# Patient Record
Sex: Male | Born: 1940
Health system: Southern US, Community
[De-identification: ages and names within clinical notes are randomized; demographics above are authoritative.]

## PROBLEM LIST (undated history)

## (undated) DIAGNOSIS — J309 Allergic rhinitis, unspecified: Secondary | ICD-10-CM

## (undated) DIAGNOSIS — I499 Cardiac arrhythmia, unspecified: Secondary | ICD-10-CM

## (undated) DIAGNOSIS — Z9889 Other specified postprocedural states: Secondary | ICD-10-CM

## (undated) DIAGNOSIS — R112 Nausea with vomiting, unspecified: Secondary | ICD-10-CM

## (undated) DIAGNOSIS — K219 Gastro-esophageal reflux disease without esophagitis: Secondary | ICD-10-CM

## (undated) DIAGNOSIS — I493 Ventricular premature depolarization: Secondary | ICD-10-CM

## (undated) DIAGNOSIS — R197 Diarrhea, unspecified: Secondary | ICD-10-CM

## (undated) DIAGNOSIS — Z87442 Personal history of urinary calculi: Secondary | ICD-10-CM

## (undated) DIAGNOSIS — Z8719 Personal history of other diseases of the digestive system: Secondary | ICD-10-CM

## (undated) HISTORY — PX: TRANSURETHRAL RESECTION OF PROSTATE: SHX73

## (undated) HISTORY — DX: Gastro-esophageal reflux disease without esophagitis: K21.9

## (undated) HISTORY — PX: FLEXIBLE SIGMOIDOSCOPY: SHX1649

## (undated) HISTORY — DX: Personal history of other diseases of the digestive system: Z87.19

## (undated) HISTORY — PX: COLON SURGERY: SHX602

## (undated) HISTORY — DX: Allergic rhinitis, unspecified: J30.9

## (undated) HISTORY — PX: REPAIR IMPERFORATE ANUS / ANORECTOPLASTY: SUR1185

## (undated) HISTORY — PX: BASAL CELL CARCINOMA EXCISION: SHX1214

## (undated) HISTORY — DX: Personal history of urinary calculi: Z87.442

## (undated) HISTORY — DX: Ventricular premature depolarization: I49.3

## (undated) HISTORY — DX: Diarrhea, unspecified: R19.7

## (undated) HISTORY — PX: COLONOSCOPY: SHX174

## (undated) HISTORY — PX: EYE SURGERY: SHX253

---

## 2004-01-14 ENCOUNTER — Other Ambulatory Visit: Payer: Self-pay

## 2004-02-09 ENCOUNTER — Ambulatory Visit: Payer: Self-pay | Admitting: General Surgery

## 2004-02-19 ENCOUNTER — Ambulatory Visit: Payer: Self-pay | Admitting: General Surgery

## 2005-08-10 IMAGING — CT CT ABD-PELV W/ CM
1 of 2 series · 14 of 32 positions shown, 19 images · non-contrast
Comparison: none

REASON FOR EXAM: SP colon resection  eval abscess fever pain
COMMENTS:

[Series 2: abdomen · axial · 0.68mm/px · z∈[-476,-100]mm · 14 of 55 slices shown, 19 images]
[im 4/55  soft-tissue]
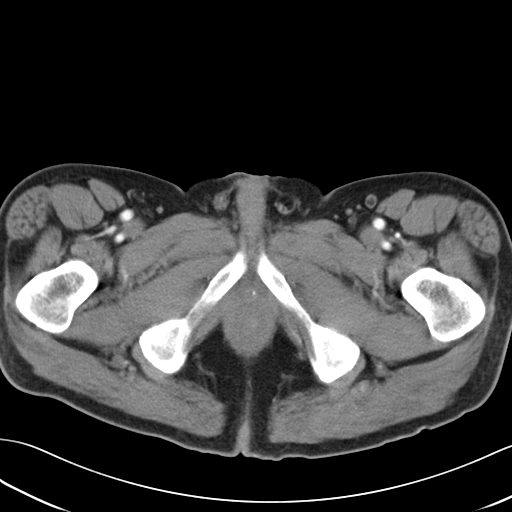
[im 4/55  bone]
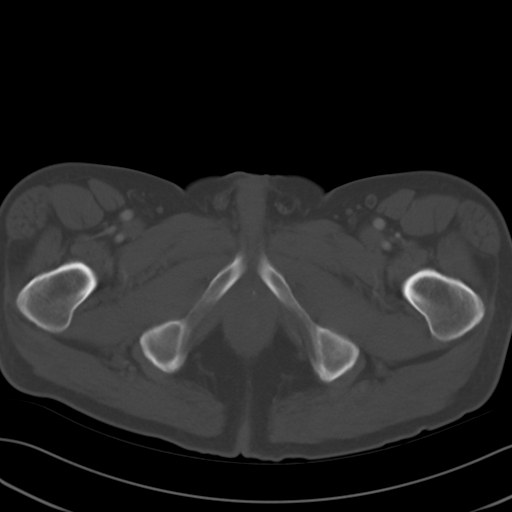
[im 7/55  soft-tissue]
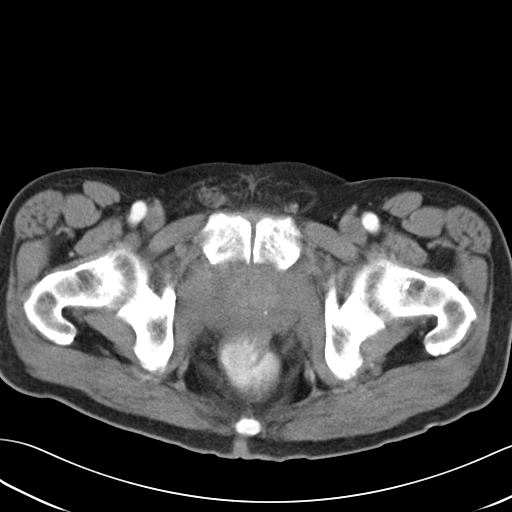
[im 11/55  soft-tissue]
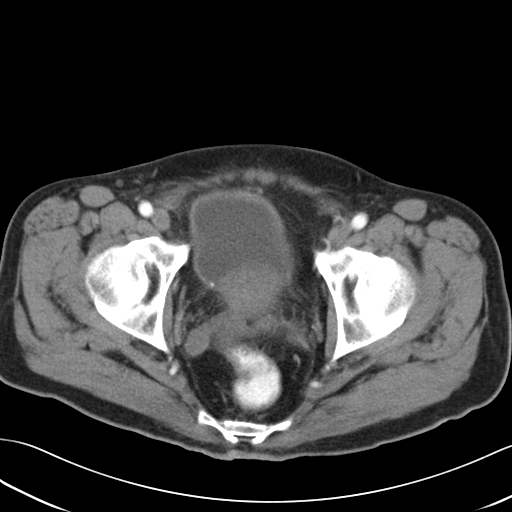
[im 17/55  soft-tissue]
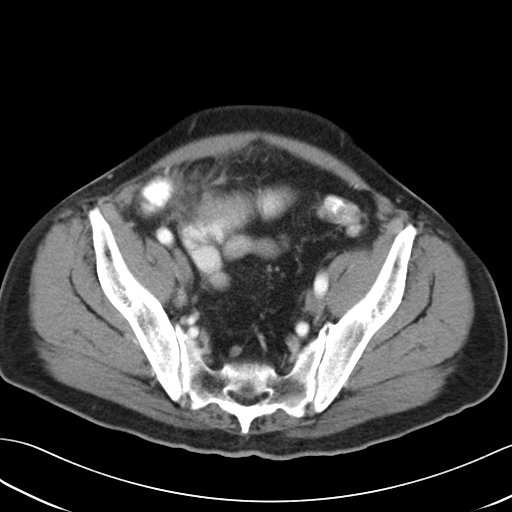
[im 21/55  soft-tissue]
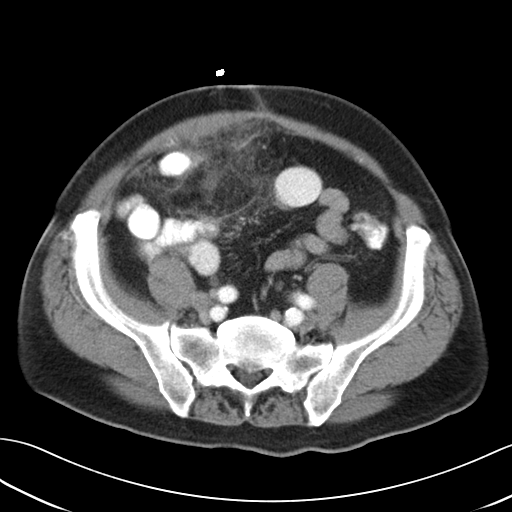
[im 24/55  soft-tissue]
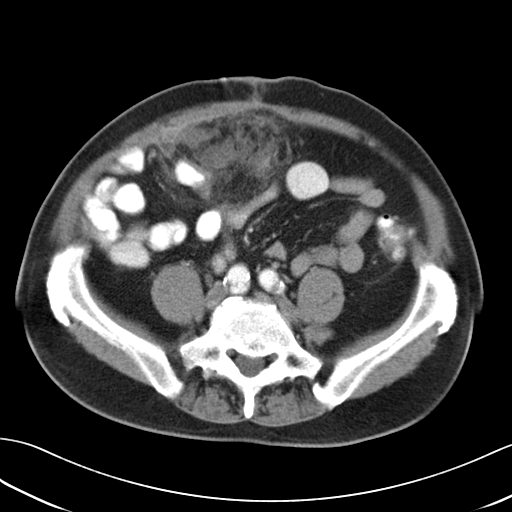
[im 28/55  soft-tissue]
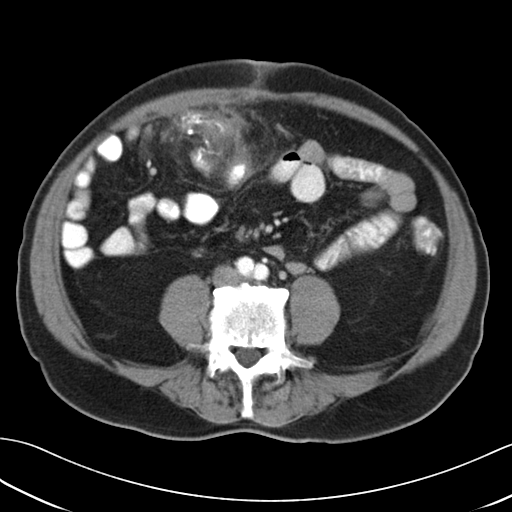
[im 31/55  soft-tissue]
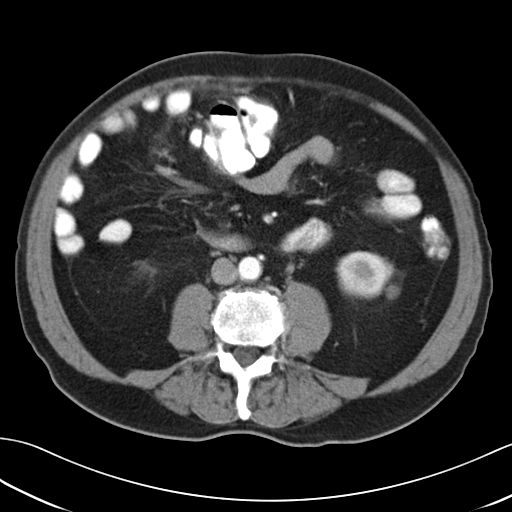
[im 34/55  soft-tissue]
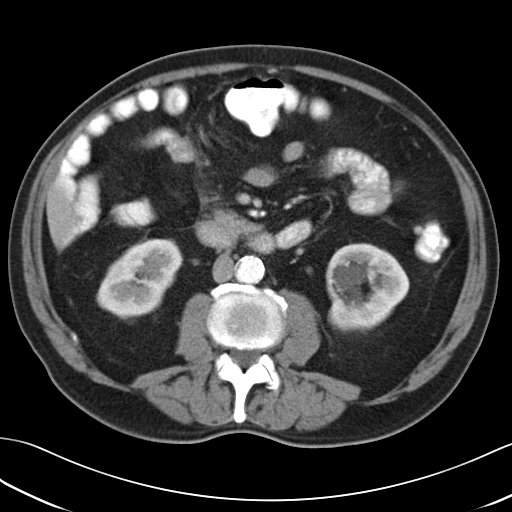
[im 34/55  bone]
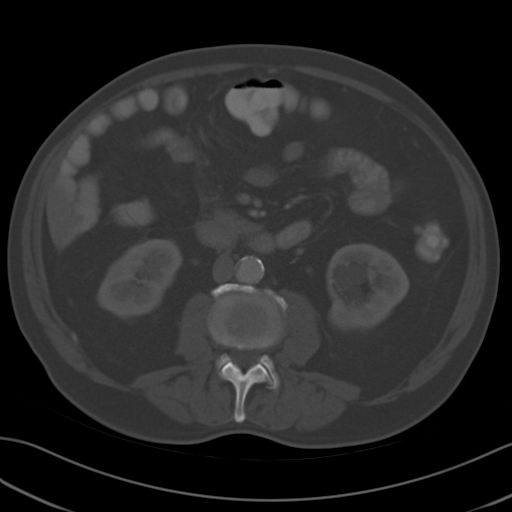
[im 38/55  soft-tissue]
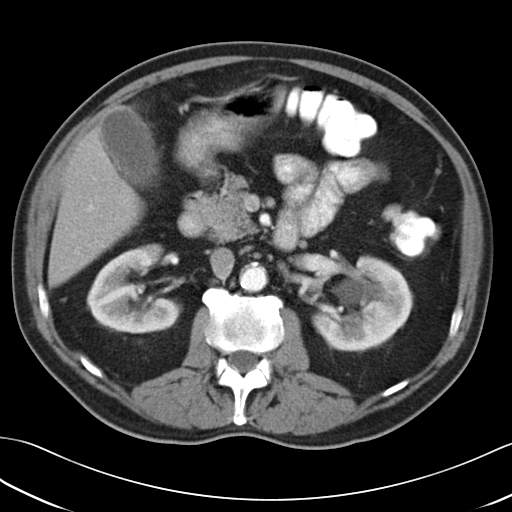
[im 41/55  lung]
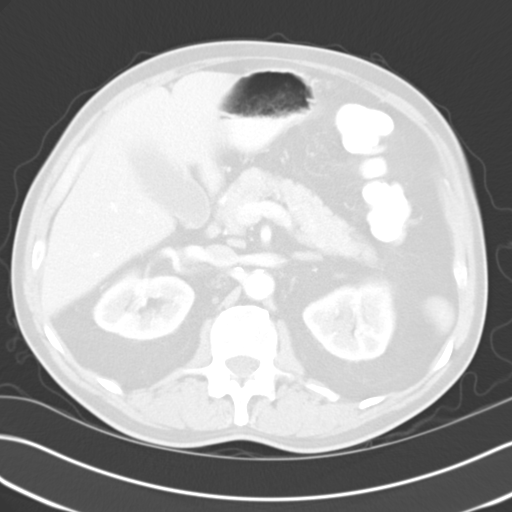
[im 44/55  soft-tissue]
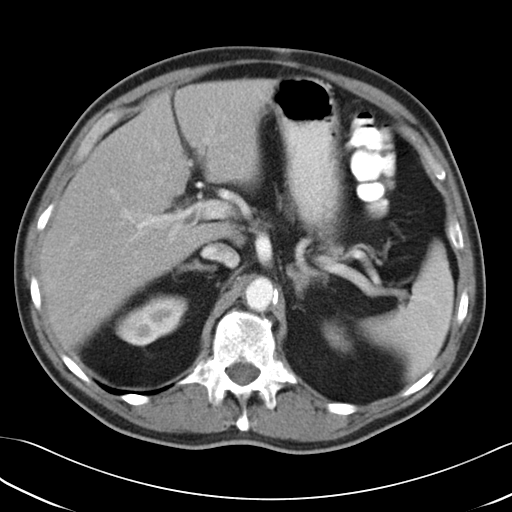
[im 44/55  lung]
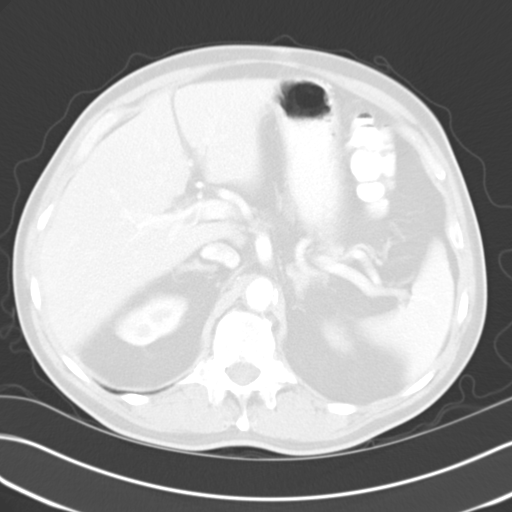
[im 48/55  soft-tissue]
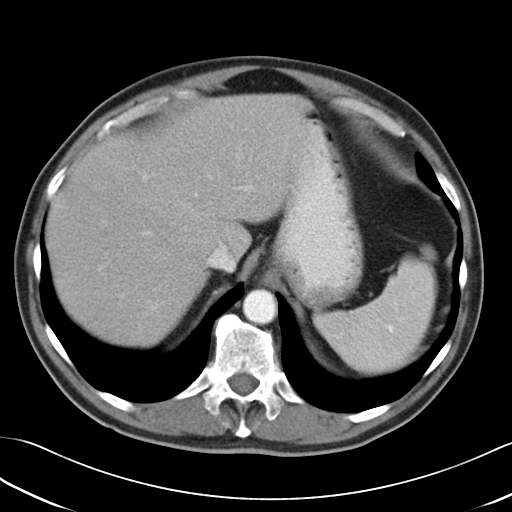
[im 48/55  lung]
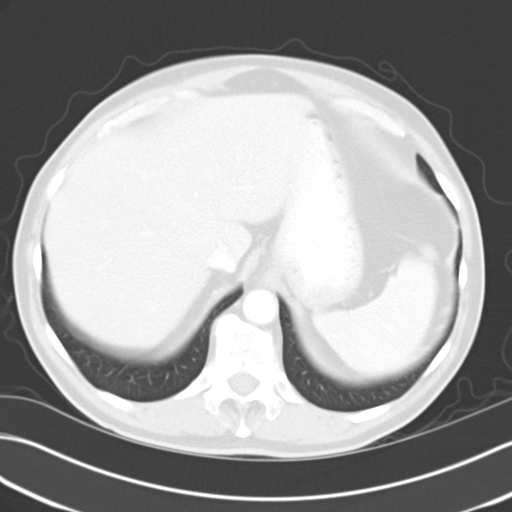
[im 51/55  soft-tissue]
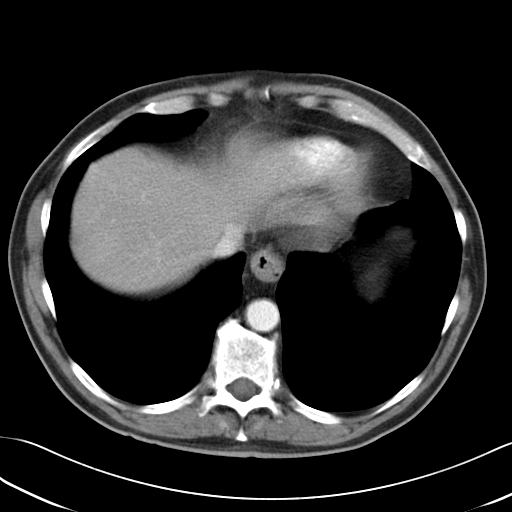
[im 51/55  lung]
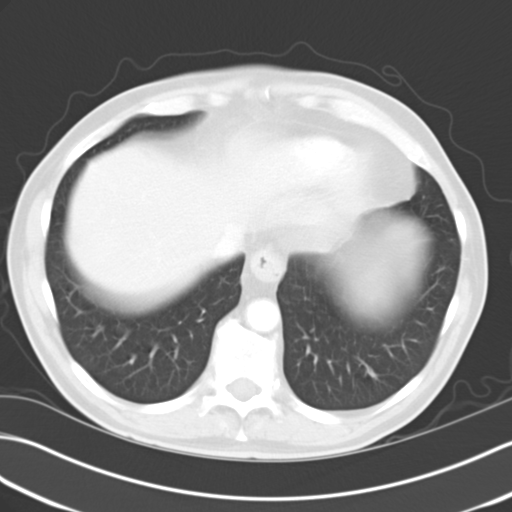

[14 of 32 positions shown; findings below may reference images not displayed]

PROCEDURE:     CT  - CT ABDOMEN / PELVIS  W  - February 19, 2004  [DATE]

RESULT:     8.0 mm helical cuts were performed through the abdomen and
pelvis with oral and 100 cc's of Tsovue-VI2 contrast.

Comparison is made to a previous examination from 02/09/2004. The prior
study showed inflammatory changes surrounding the transverse colon
proximally but no discrete fluid collection or abscess was identified.

Today's examination shows the lung bases to be clear. The liver, spleen,
stomach, pancreas, gallbladder, adrenals and RIGHT kidney are unremarkable.
There is evidence of an extrarenal pelvis involving the LEFT kidney. There
remain inflammatory changes surrounding the transverse colon in the ventral
midline of the abdomen which are essentially unchanged since the prior
examination. There remains no discrete evidence of an abscess. No free
intraperitoneal fluid or air is identified. The bladder distends normally
with evidence of some mild wall thickening, most likely due to an enlarged
prostate gland. The prostate gland contains numerous calcifications
consistent with chronic prostatitis. There is nonspecific thickening of the
sigmoid colon.
IMPRESSION: 1.     Persistent inflammatory changes are seen in the mesenteric fat
surrounding the proximal transverse colon. These inflammatory changes are
not significantly different than were described on a prior study of
02/09/2004. There remains no discrete evidence of an abscess or suspicious
fluid collection.
2.     The solid organs of the abdomen are unremarkable.
3.     There is evidence of an extrarenal pelvis involving the LEFT kidney.
4.     The bladder shows a mildly thickened wall most likely due to an
enlarged prostate which contains numerous calcifications.
5.     Nonspecifically thickened sigmoid colon.

## 2005-09-13 ENCOUNTER — Ambulatory Visit: Payer: Self-pay | Admitting: Pain Medicine

## 2005-09-14 ENCOUNTER — Ambulatory Visit: Payer: Self-pay | Admitting: Pain Medicine

## 2005-10-20 ENCOUNTER — Ambulatory Visit: Payer: Self-pay | Admitting: Pain Medicine

## 2006-05-08 ENCOUNTER — Ambulatory Visit: Payer: Self-pay | Admitting: Urology

## 2007-02-06 ENCOUNTER — Ambulatory Visit: Payer: Self-pay | Admitting: General Surgery

## 2009-03-05 ENCOUNTER — Ambulatory Visit: Payer: Self-pay | Admitting: Urology

## 2009-03-09 ENCOUNTER — Ambulatory Visit: Payer: Self-pay | Admitting: Urology

## 2011-08-26 ENCOUNTER — Ambulatory Visit: Payer: Self-pay | Admitting: Internal Medicine

## 2011-08-26 LAB — BASIC METABOLIC PANEL
Anion Gap: 10 (ref 7–16)
BUN: 23 mg/dL — ABNORMAL HIGH (ref 7–18)
Calcium, Total: 9 mg/dL (ref 8.5–10.1)
Chloride: 99 mmol/L (ref 98–107)
Co2: 26 mmol/L (ref 21–32)
Creatinine: 1.21 mg/dL (ref 0.60–1.30)
EGFR (African American): >60
EGFR (Non-African Amer.): >60
Glucose: 101 mg/dL — ABNORMAL HIGH (ref 65–99)
Osmolality: 274 (ref 275–301)
Potassium: 3.3 mmol/L — ABNORMAL LOW (ref 3.5–5.1)
Sodium: 135 mmol/L — ABNORMAL LOW (ref 136–145)

## 2011-10-18 ENCOUNTER — Ambulatory Visit: Payer: Self-pay | Admitting: Unknown Physician Specialty

## 2011-10-19 LAB — PATHOLOGY REPORT

## 2012-06-02 ENCOUNTER — Emergency Department: Payer: Self-pay | Admitting: Emergency Medicine

## 2012-06-02 LAB — URINALYSIS, COMPLETE
Bacteria: NONE SEEN
Glucose,UR: NEGATIVE mg/dL (ref 0–75)
Ketone: NEGATIVE
Nitrite: NEGATIVE
Ph: 6 (ref 4.5–8.0)
RBC,UR: NONE SEEN /HPF (ref 0–5)
Squamous Epithelial: <1

## 2012-06-02 LAB — COMPREHENSIVE METABOLIC PANEL
Albumin: 3.7 g/dL (ref 3.4–5.0)
Anion Gap: 7 (ref 7–16)
BUN: 17 mg/dL (ref 7–18)
Bilirubin,Total: 0.5 mg/dL (ref 0.2–1.0)
Calcium, Total: 8.8 mg/dL (ref 8.5–10.1)
Co2: 29 mmol/L (ref 21–32)
EGFR (African American): >60
EGFR (Non-African Amer.): >60
Glucose: 120 mg/dL — ABNORMAL HIGH (ref 65–99)
Osmolality: 280 (ref 275–301)
Potassium: 3.9 mmol/L (ref 3.5–5.1)
SGOT(AST): 46 U/L — ABNORMAL HIGH (ref 15–37)
Total Protein: 7.9 g/dL (ref 6.4–8.2)

## 2012-06-02 LAB — CBC
HGB: 15.1 g/dL (ref 13.0–18.0)
MCH: 31.6 pg (ref 26.0–34.0)
MCV: 93 fL (ref 80–100)
Platelet: 264 10*3/uL (ref 150–440)
RBC: 4.79 10*6/uL (ref 4.40–5.90)
WBC: 6.9 10*3/uL (ref 3.8–10.6)

## 2012-06-02 LAB — CK TOTAL AND CKMB (NOT AT ARMC)
CK, Total: 133 U/L (ref 35–232)
CK-MB: 0.9 ng/mL (ref 0.5–3.6)

## 2012-06-07 ENCOUNTER — Encounter: Payer: Self-pay | Admitting: *Deleted

## 2012-06-08 ENCOUNTER — Encounter: Payer: Self-pay | Admitting: Cardiovascular Disease

## 2012-06-08 ENCOUNTER — Ambulatory Visit (INDEPENDENT_AMBULATORY_CARE_PROVIDER_SITE_OTHER): Payer: Medicare Other | Admitting: Cardiovascular Disease

## 2012-06-08 VITALS — BP 124/74 | HR 84 | Ht 68.0 in | Wt 177.8 lb

## 2012-06-08 DIAGNOSIS — R002 Palpitations: Secondary | ICD-10-CM

## 2012-06-08 DIAGNOSIS — I4949 Other premature depolarization: Secondary | ICD-10-CM

## 2012-06-08 DIAGNOSIS — R0602 Shortness of breath: Secondary | ICD-10-CM

## 2012-06-08 DIAGNOSIS — I10 Essential (primary) hypertension: Secondary | ICD-10-CM

## 2012-06-08 DIAGNOSIS — I493 Ventricular premature depolarization: Secondary | ICD-10-CM

## 2012-06-08 NOTE — Assessment & Plan Note (Signed)
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 He has been on metoprolol 50 mg twice daily without change and the dose. I recommend a 48 hour Holter monitor as well as an echocardiogram to evaluate LV systolic function or any other underlying structural abnormalities. He is not having any symptoms suggestive of angina and thus I will hold off on doing a stress test at this time

## 2012-06-08 NOTE — Patient Instructions (Addendum)
Your physician has requested that you have an echocardiogram. Echocardiography is a painless test that uses sound waves to create images of your heart. It provides your doctor with information about the size and shape of your heart and how well your heart's chambers and valves are working. This procedure takes approximately one hour. There are no restrictions for this procedure.  Your physician has recommended that you wear a holter monitor. Holter monitors are medical devices that record the heart's electrical activity. Doctors most often use these monitors to diagnose arrhythmias. Arrhythmias are problems with the speed or rhythm of the heartbeat. The monitor is a small, portable device. You can wear one while you do your normal daily activities. This is usually used to diagnose what is causing palpitations/syncope (passing out).  Follow up after tests.  

## 2012-06-08 NOTE — Progress Notes (Signed)
Primary care physician: Dr. Juanetta Gosling  HPI  This is a pleasant 72 year old male who is here today for consultation regarding palpitations and PVCs. The patient reports prolonged history of palpitations thought to be due to PVCs. He was diagnosed with this for years ago when he used to see Dr. Dossie Arbour. He had a stress test a few years ago. He was placed on metoprolol with significant improvement in his symptoms up until recently when he started noticing increased skipping in his heart. This usually happens at rest and at night and not with physical activities. It makes him dyspneic. He denies any chest pain, dizziness, syncope or presyncope. He walks on a regular basis for exercise without any significant limitation. He does not consume excessive amounts of caffeine and is not aware of any history of thyroid problems. He went to the emergency room recently at [facility] due to increased palpitations. His basic labs and EKGs were unremarkable. He has no family history of premature coronary artery disease or sudden death.  No Known Allergies   Current Outpatient Prescriptions on File Prior to Visit  Medication Sig Dispense Refill  . amLODipine (NORVASC) 5 MG tablet Take 5 mg by mouth daily.      Marland Kitchen aspirin 81 MG tablet Take 81 mg by mouth daily.      . benazepril (LOTENSIN) 40 MG tablet Take 40 mg by mouth daily.      . fluticasone (FLONASE) 50 MCG/ACT nasal spray Place 2 sprays into the nose daily.      . hydrochlorothiazide (HYDRODIURIL) 25 MG tablet Take 25 mg by mouth daily.      . metoprolol (LOPRESSOR) 50 MG tablet Take 50 mg by mouth 2 (two) times daily.      . NON FORMULARY Cholestoff 2 tablets daily.      . Omega-3 Fatty Acids (FISH OIL) 1200 MG CAPS Take 2 capsules by mouth.      . Sodium Chloride-Sodium Bicarb (AYR SALINE NASAL NETI RINSE) 1.57 G PACK Place into the nose.         Past Medical History  Diagnosis Date  . Allergic rhinitis, cause unspecified   . Esophageal reflux   .  Unspecified hyperplasia of prostate without urinary obstruction and other lower urinary tract symptoms (LUTS)   . H/O diverticulitis of colon   . History of kidney stones   . H/O: hypertension   . Cancer     skin;history  . Vomiting alone   . Acute sinusitis, unspecified   . Diarrhea   . Premature beats, unspecified   . Other and unspecified hyperlipidemia   . Vomiting alone   . Unspecified essential hypertension      Past Surgical History  Procedure Date  . Colonoscopy   . Repair imperforate anus / anorectoplasty   . Transurethral resection of prostate      Family History  Problem Relation Age of Onset  . Hypertension Father      History   Social History  . Marital Status: Married    Spouse Name: N/A    Number of Children: N/A  . Years of Education: N/A   Occupational History  . Not on file.   Social History Main Topics  . Smoking status: Never Smoker   . Smokeless tobacco: Not on file  . Alcohol Use: Not on file  . Drug Use: Not on file  . Sexually Active: Not on file   Other Topics Concern  . Not on file   Social History  Narrative  . No narrative on file     ROS Constitutional: Negative for fever, chills, diaphoresis, activity change, appetite change and fatigue.  HENT: Negative for hearing loss, nosebleeds, congestion, sore throat, facial swelling, drooling, trouble swallowing, neck pain, voice change, sinus pressure and tinnitus.  Eyes: Negative for photophobia, pain, discharge and visual disturbance.  Respiratory: Negative for apnea, cough, chest tightness  and wheezing.  Cardiovascular: Negative for chest pain and leg swelling.  Gastrointestinal: Negative for nausea, vomiting, abdominal pain, diarrhea, constipation, blood in stool and abdominal distention.  Genitourinary: Negative for dysuria, urgency, frequency, hematuria and decreased urine volume.  Musculoskeletal: Negative for myalgias, back pain, joint swelling, arthralgias and gait problem.   Skin: Negative for color change, pallor, rash and wound.  Neurological: Negative for dizziness, tremors, seizures, syncope, speech difficulty, weakness, light-headedness, numbness and headaches.  Psychiatric/Behavioral: Negative for suicidal ideas, hallucinations, behavioral problems and agitation. The patient is not nervous/anxious.     PHYSICAL EXAM   BP 124/74  Pulse 84  Ht 5\' 8"  (1.727 m)  Wt 177 lb 12 oz (80.627 kg)  BMI 27.03 kg/m2  Constitutional: He is oriented to person, place, and time. He appears well-developed and well-nourished. No distress.  HENT: No nasal discharge.  Head: Normocephalic and atraumatic.  Eyes: Pupils are equal and round. Right eye exhibits no discharge. Left eye exhibits no discharge.  Neck: Normal range of motion. Neck supple. No JVD present. No thyromegaly present.  Cardiovascular: Normal rate, regular rhythm, normal heart sounds and. Exam reveals no gallop and no friction rub. No murmur heard.  Pulmonary/Chest: Effort normal and breath sounds normal. No stridor. No respiratory distress. He has no wheezes. He has no rales. He exhibits no tenderness.  Abdominal: Soft. Bowel sounds are normal. He exhibits no distension. There is no tenderness. There is no rebound and no guarding.  Musculoskeletal: Normal range of motion. He exhibits no edema and no tenderness.  Neurological: He is alert and oriented to person, place, and time. Coordination normal.  Skin: Skin is warm and dry. No rash noted. He is not diaphoretic. No erythema. No pallor.  Psychiatric: He has a normal mood and affect. His behavior is normal. Judgment and thought content normal.      EKG: Sinus  Rhythm  -  Nonspecific T-abnormality.   ABNORMAL     ASSESSMENT AND PLAN

## 2012-06-08 NOTE — Assessment & Plan Note (Signed)
His blood pressure is well controlled on current medications. 

## 2012-06-26 ENCOUNTER — Other Ambulatory Visit: Payer: Self-pay

## 2012-06-26 ENCOUNTER — Other Ambulatory Visit (INDEPENDENT_AMBULATORY_CARE_PROVIDER_SITE_OTHER): Payer: Medicare Other

## 2012-06-26 ENCOUNTER — Ambulatory Visit (INDEPENDENT_AMBULATORY_CARE_PROVIDER_SITE_OTHER): Payer: Medicare Other

## 2012-06-26 DIAGNOSIS — R002 Palpitations: Secondary | ICD-10-CM

## 2012-06-26 DIAGNOSIS — I493 Ventricular premature depolarization: Secondary | ICD-10-CM

## 2012-06-26 DIAGNOSIS — I4949 Other premature depolarization: Secondary | ICD-10-CM

## 2012-06-26 DIAGNOSIS — R0602 Shortness of breath: Secondary | ICD-10-CM

## 2012-06-26 NOTE — Progress Notes (Signed)
Placed 48 hour holter monitor on 06/26/12-06/28/12 @ 4:50 pm. Kyle Vargas

## 2012-07-05 ENCOUNTER — Encounter: Payer: Self-pay | Admitting: Cardiovascular Disease

## 2012-07-05 ENCOUNTER — Ambulatory Visit (INDEPENDENT_AMBULATORY_CARE_PROVIDER_SITE_OTHER): Payer: Medicare Other | Admitting: Cardiovascular Disease

## 2012-07-05 VITALS — BP 110/80 | HR 72 | Ht 68.5 in | Wt 175.5 lb

## 2012-07-05 DIAGNOSIS — I4949 Other premature depolarization: Secondary | ICD-10-CM

## 2012-07-05 DIAGNOSIS — I499 Cardiac arrhythmia, unspecified: Secondary | ICD-10-CM

## 2012-07-05 DIAGNOSIS — I493 Ventricular premature depolarization: Secondary | ICD-10-CM

## 2012-07-05 DIAGNOSIS — I10 Essential (primary) hypertension: Secondary | ICD-10-CM

## 2012-07-05 NOTE — Progress Notes (Signed)
Primary care physician: Dr. Juanetta Gosling  HPI  This is a pleasant 72 year old male who is here today for a followup visit regarding  palpitations and PVCs. The patient has prolonged history of palpitations thought to be due to PVCs. He was diagnosed with this years ago when he used to see Dr. Dossie Arbour. He had a stress test a few years ago. He was placed on metoprolol with significant improvement in his symptoms up until recently when he started noticing increased skipping in his heart. This usually happens at rest and at night and not with physical activities. It makes him dyspneic. He denies any chest pain, dizziness, syncope or presyncope. He walks on a regular basis for exercise without any significant limitation. He does not consume excessive amounts of caffeine . He went to the emergency room at [facility] due to increased palpitations. His basic labs and EKGs were unremarkable. Thyroid function was normal. He has no family history of premature coronary artery disease or sudden death. He underwent an echocardiogram which showed normal LV systolic function without significant valvular abnormalities. There was mild left ventricular hypertrophy with mild diastolic dysfunction. Holter monitor results are still pending.  No Known Allergies   Current Outpatient Prescriptions on File Prior to Visit  Medication Sig Dispense Refill  . amLODipine (NORVASC) 5 MG tablet Take 5 mg by mouth daily.      Marland Kitchen aspirin 81 MG tablet Take 81 mg by mouth daily.      . benazepril (LOTENSIN) 40 MG tablet Take 40 mg by mouth daily.      . fluticasone (FLONASE) 50 MCG/ACT nasal spray Place 2 sprays into the nose as needed.       . hydrochlorothiazide (HYDRODIURIL) 25 MG tablet Take 25 mg by mouth daily.      . metoprolol (LOPRESSOR) 50 MG tablet Take 50 mg by mouth 2 (two) times daily.      . NON FORMULARY Cholestoff 1 tablets daily.      . Omega-3 Fatty Acids (FISH OIL) 1200 MG CAPS Take 2 capsules by mouth.      . Sodium  Chloride-Sodium Bicarb (AYR SALINE NASAL NETI RINSE) 1.57 G PACK Place into the nose.       No current facility-administered medications on file prior to visit.     Past Medical History  Diagnosis Date  . Allergic rhinitis, cause unspecified   . Esophageal reflux   . Unspecified hyperplasia of prostate without urinary obstruction and other lower urinary tract symptoms (LUTS)   . H/O diverticulitis of colon   . History of kidney stones   . H/O: hypertension   . Cancer     skin;history  . Vomiting alone   . Acute sinusitis, unspecified   . Diarrhea   . Premature beats, unspecified   . Other and unspecified hyperlipidemia   . Vomiting alone   . Unspecified essential hypertension      Past Surgical History  Procedure Laterality Date  . Colonoscopy    . Repair imperforate anus / anorectoplasty    . Transurethral resection of prostate       Family History  Problem Relation Age of Onset  . Hypertension Father      History   Social History  . Marital Status: Married    Spouse Name: N/A    Number of Children: N/A  . Years of Education: N/A   Occupational History  . Not on file.   Social History Main Topics  . Smoking status: Never  Smoker   . Smokeless tobacco: Not on file  . Alcohol Use: Not on file  . Drug Use: Not on file  . Sexually Active: Not on file   Other Topics Concern  . Not on file   Social History Narrative  . No narrative on file       PHYSICAL EXAM   BP 110/80  Pulse 72  Ht 5' 8.5" (1.74 m)  Wt 175 lb 8 oz (79.606 kg)  BMI 26.29 kg/m2  Constitutional: He is oriented to person, place, and time. He appears well-developed and well-nourished. No distress.  HENT: No nasal discharge.  Head: Normocephalic and atraumatic.  Eyes: Pupils are equal and round. Right eye exhibits no discharge. Left eye exhibits no discharge.  Neck: Normal range of motion. Neck supple. No JVD present. No thyromegaly present.  Cardiovascular: Normal rate,  regular rhythm, normal heart sounds and. Exam reveals no gallop and no friction rub. No murmur heard.  Pulmonary/Chest: Effort normal and breath sounds normal. No stridor. No respiratory distress. He has no wheezes. He has no rales. He exhibits no tenderness.  Abdominal: Soft. Bowel sounds are normal. He exhibits no distension. There is no tenderness. There is no rebound and no guarding.  Musculoskeletal: Normal range of motion. He exhibits no edema and no tenderness.  Neurological: He is alert and oriented to person, place, and time. Coordination normal.  Skin: Skin is warm and dry. No rash noted. He is not diaphoretic. No erythema. No pallor.  Psychiatric: He has a normal mood and affect. His behavior is normal. Judgment and thought content normal.      [id number]  Rhythm  - frequent ectopic ventricular beat s  # VECs = 2 -Prominent R(V1) -nonspecific.   -Nonspecific ST depression   +   Nonspecific T-abnormality  -Nondiagnostic.   ABNORMAL     ASSESSMENT AND PLAN

## 2012-07-05 NOTE — Patient Instructions (Addendum)
Continue same medications.  We will call you after reviewing the monitor.  Follow up as needed.

## 2012-07-05 NOTE — Assessment & Plan Note (Signed)
Echocardiogram overall was reassuring. Holter monitor results are still not available but I suspect that it will show frequent PVCs. His thyroid function was normal and he does not consume significant amounts of caffeine. I will wait until I review the Holter monitor before making changes in his medications. One consideration would be to increase metoprolol to 75 mg twice daily and stop hydrochlorothiazide.

## 2012-07-05 NOTE — Assessment & Plan Note (Signed)
Blood pressure is well controlled on current medications but it is actually running low. He has occasional episodes of hypotension. As outlined above, his metoprolol was increased, I will decrease or stop hydrochlorothiazide.

## 2012-07-11 ENCOUNTER — Telehealth: Payer: Self-pay

## 2012-07-11 NOTE — Telephone Encounter (Signed)
Pt called asking for HM results I called [facility] and talked with Delice Bison She tells me she will have Rose scan this and fax results to [place] tomm  Pt informed Understanding verb

## 2012-07-16 NOTE — Telephone Encounter (Signed)
Pt calling for results again, per Dr. Jari Sportsman instruction

## 2012-07-16 NOTE — Telephone Encounter (Signed)
Can you please get me these results? I spoke with Delice Bison last week about this and was supposed to fax me the results last Thursday.  We need these results please. Thanks!

## 2012-07-17 NOTE — Telephone Encounter (Signed)
I attempted to call Delice Bison or Rose in [place] [id number] monitor Neither are in office today I called Lawanna Kobus and [facility]

## 2012-07-17 NOTE — Telephone Encounter (Signed)
I received t/c from pt's PCP, Dr. Juanetta Gosling' nurse, Tedd Sias, who says pt has called their office requesting HM results from [place].  He says he has been trying to get results from [place] and wonders if Dr. Juanetta Gosling' calls himself, he may be able to get results.  Nurse is very upset that we have not gotten results back yet. I explained we have been working of getting these results for a week now with no response.  Tedd Sias would like [place] to be more aggressive about this since "we referred him to you". I explained we are doing all we can and depend on [facility] to get these to [place].  She understands but pt is getting impatient.  I will work on this further and call her back at [phone].

## 2012-07-17 NOTE — Telephone Encounter (Signed)
HM results received from [facility] I was able to discuss with Dr. Kirke Corin He is off this afternoon and will be back in office tomm He asks that I call pt to reassure him there is nothing life-threatening found on monitor and he will review in detail tomm

## 2012-07-17 NOTE — Telephone Encounter (Signed)
I called Kyle Vargas with Dr. Juanetta Gosling' office and informed her of what is going on as well Understanding verb

## 2012-07-17 NOTE — Telephone Encounter (Signed)
Please see below and review holter that is scanned in to your attention and advise Thanks!

## 2012-07-17 NOTE — Telephone Encounter (Signed)
Kyle Vargas in [facility] faxing Holter tracings

## 2012-07-17 NOTE — Telephone Encounter (Signed)
Pt was informed we have th monitor Preliminary results given to pt I explained we would call him tomorrow after Dr. Kirke Corin reviews holter in it's entirety.  Understanding verb He asks that we call him on his cell phone when we call him back

## 2012-07-18 ENCOUNTER — Other Ambulatory Visit: Payer: Self-pay

## 2012-07-18 ENCOUNTER — Other Ambulatory Visit: Payer: Self-pay | Admitting: Cardiovascular Disease

## 2012-07-18 ENCOUNTER — Telehealth: Payer: Self-pay

## 2012-07-18 MED ORDER — DILTIAZEM HCL ER COATED BEADS 240 MG PO CP24: 240.0000 mg | ORAL_CAPSULE | Freq: Every day | ORAL | Status: DC

## 2012-07-18 NOTE — Telephone Encounter (Signed)
I spoke with the patient and informed him . Lots of PVCs.  Stop Metoprolol and Amlodipine.  Start Diltiazem ER 240 mg once daily. He is to update [place] in 1 week.  Follow up with me in 1 month.

## 2012-07-18 NOTE — Telephone Encounter (Signed)
Dr. Kirke Corin has received Holter in [facility] and will review and is going to call pt himself

## 2012-07-18 NOTE — Telephone Encounter (Signed)
Have Dr. Kirke Corin review

## 2012-07-18 NOTE — Telephone Encounter (Signed)
I spoke with pt to make sure he understood instructions and to make him aware that I sent new RX to pharmacy He verb understanding of instructions appt was made for 4/11 with Dr. Kirke Corin I will check on him in 1 week

## 2012-07-25 ENCOUNTER — Telehealth: Payer: Self-pay

## 2012-07-25 NOTE — Telephone Encounter (Signed)
Pt started diltiazem ER 240 mg daily per Dr. Jari Sportsman instruction last week Says he has not noticed improvement in symptoms Still c/o a lot of PVCs States "yestrerday was a bad day" d/t PVCs Denies sob today Bps=127/37-144/57, 108/51-158/66 HR=78-115 BPM C/o cough and "hoarse" Also mentions some bilateral arm numbness at hs Denies CP or SOB  He takes the cardizem at hs  I told him I would let dr. Kirke Corin know and will call him back Understanding verb

## 2012-07-25 NOTE — Telephone Encounter (Signed)
assess 

## 2012-07-25 NOTE — Telephone Encounter (Signed)
Increase Diltiazem ER to 360 mg once daily. Refer to Dr. Graciela Husbands for an EP consult.

## 2012-07-26 ENCOUNTER — Other Ambulatory Visit: Payer: Self-pay

## 2012-07-26 MED ORDER — DILTIAZEM HCL ER COATED BEADS 120 MG PO CP24: 120.0000 mg | ORAL_CAPSULE | Freq: Every day | ORAL | Status: DC

## 2012-07-26 NOTE — Telephone Encounter (Signed)
Pt informed Understanding verb RX for Cardizem ER 120 mg to add to 240 mg tablets sent to pharm First available appt with Dr.Klein is not until 5/1 with [place] and 5/2 in [place] I will talk with Dr. Graciela Husbands this pm to see if we can work pt in sooner at either location Pt is symptomatic and would like this addressed sooner than May I will call pt after I talk with Dr. Graciela Husbands.

## 2012-07-27 NOTE — Telephone Encounter (Signed)
Emailed Dr. Graciela Husbands to see if we can work pt in

## 2012-07-27 NOTE — Telephone Encounter (Signed)
I called pt to inform him we can see him (Dr. Graciela Husbands) 3/25 at 4:15 Understanding verb Says he did not receive new RX for cardizem 120 mg capsules I had him hold while I checked on this Per [place] at pharmacy, they never received RX I gave them new RX over phone Pt informed

## 2012-07-27 NOTE — Telephone Encounter (Signed)
Per Dr. Graciela Husbands, ok to double book him next time he is in [facility] (3/25) I will call pt to inform

## 2012-07-31 ENCOUNTER — Encounter: Payer: Self-pay | Admitting: Internal Medicine

## 2012-07-31 ENCOUNTER — Ambulatory Visit (INDEPENDENT_AMBULATORY_CARE_PROVIDER_SITE_OTHER): Payer: Medicare Other | Admitting: Internal Medicine

## 2012-07-31 VITALS — BP 108/70 | HR 87 | Ht 68.0 in | Wt 176.2 lb

## 2012-07-31 DIAGNOSIS — I4949 Other premature depolarization: Secondary | ICD-10-CM

## 2012-07-31 DIAGNOSIS — I10 Essential (primary) hypertension: Secondary | ICD-10-CM

## 2012-07-31 DIAGNOSIS — R002 Palpitations: Secondary | ICD-10-CM

## 2012-07-31 DIAGNOSIS — I493 Ventricular premature depolarization: Secondary | ICD-10-CM

## 2012-07-31 NOTE — Assessment & Plan Note (Signed)
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 Marcheta Grammes. He may well have emerged following the decrease in his beta blockers undertaken last summer with his episode of hypotension. If further illumination of the beta blockers may be contributing to his sense of rapid heart rate.  It is encouraging that there is no impact on left ventricular function. 10% PVCs is about the borderline we'll begin to see that.  In the absence of impact on his left ventricular function, therapies are directed symptoms. These ectopics are quite symptomatic. His sense of rapid heartbeat is also bothersome. We will try to add a little bit of beta blocker back to his diltiazem as his resting rate is still in the 80s. To compensate for blood pressure issues we will discontinue his benazepril.  We will start him on Lopressor 25 twice daily. He is advised to keep track of his heart rate and his blood pressure. He'll be following up with Dr. Marcheta Grammes in the next few weeks.  In the event that this is insufficient, would undertake stress testing to exclude coronary disease and then attempt the use of flecainide in addition to the diltiazem or the beta blocker. Alternatively, if the above fail oral drugs or not tolerated catheter ablation could be considered.

## 2012-07-31 NOTE — Assessment & Plan Note (Signed)
The patient's device was interrogated.  The information was reviewed. No changes were made in the programming.    

## 2012-07-31 NOTE — Assessment & Plan Note (Signed)
Sensation of a rapid heartbeat possibly related to beta blocker withdrawal Hospital records reviewed her laboratories including thyroid and CBC were normal in January

## 2012-07-31 NOTE — Progress Notes (Signed)
ELECTROPHYSIOLOGY CONSULT NOTE  Patient ID: Kyle Vargas, MRN: [id number], DOB/AGE: 1941/02/02 72 y.o. Admit date: (Not on file) Date of Consult: 07/31/2012  Primary Physician: Park Pope, MD Primary Cardiologist: MA  Chief Complaint: palpitations   HPI Kyle Vargas is a 72 y.o. male   With a long history of palpitations described to PVCs and for which  he was on beta blockers for 5 or 10 years. This summer he had an episode of hypotension prompting reduction of his beta blocker. His ACE inhibitor dose was maintained. In the months that followed he started noticing more palpitations. These were particularly prominent at rest. Holter monitor was obtained and demonstrated 10% PVCs with a morphology consistent with origin in the right ventricular outflow tract. Only single ectopics were noted. There was typical Diurnal variation. An echocardiogram demonstrated normal left ventricular function. Dr. Marcheta Grammes discontinued his beta blocker and put him on diltiazem; without titration from 240-360 there has been a significant improvement in his palpitations. He has however concurrent with this noted a faster resting rate into the 80s and 90s  He denies chest pain exercise intolerance syncope.        Past Medical History  Diagnosis Date  . Allergic rhinitis, cause unspecified   . Esophageal reflux   . Unspecified hyperplasia of prostate without urinary obstruction and other lower urinary tract symptoms (LUTS)   . H/O diverticulitis of colon   . History of kidney stones   . H/O: hypertension   . Cancer     skin;history  . Vomiting alone   . Acute sinusitis, unspecified   . Diarrhea   . Premature beats, unspecified   . Other and unspecified hyperlipidemia   . Vomiting alone   . Unspecified essential hypertension       Surgical History:  Past Surgical History  Procedure Laterality Date  . Colonoscopy    . Repair imperforate anus / anorectoplasty    . Transurethral resection of  prostate       Home Meds: Prior to Admission medications   Medication Sig Start Date End Date Taking? Authorizing Provider  aspirin 81 MG tablet Take 81 mg by mouth daily.   Yes Historical Provider, MD  benazepril (LOTENSIN) 40 MG tablet Take 40 mg by mouth daily.   Yes Historical Provider, MD  diltiazem (CARDIZEM CD) 120 MG 24 hr capsule Take 1 capsule (120 mg total) by mouth daily. 07/26/12  Yes Allene Dillon, MD  diltiazem (CARDIZEM CD) 240 MG 24 hr capsule Take 1 capsule (240 mg total) by mouth daily. 07/18/12  Yes Iran Ouch, MD  fluticasone (FLONASE) 50 MCG/ACT nasal spray Place 2 sprays into the nose as needed.    Yes Historical Provider, MD  hydrochlorothiazide (HYDRODIURIL) 25 MG tablet Take 25 mg by mouth daily.   Yes Historical Provider, MD  NON FORMULARY Cholestoff 1 tablets daily.   Yes Historical Provider, MD  Omega-3 Fatty Acids (FISH OIL) 1200 MG CAPS Take 2 capsules by mouth.   Yes Historical Provider, MD  Sodium Chloride-Sodium Bicarb (AYR SALINE NASAL NETI RINSE) 1.57 G PACK Place into the nose.   Yes Historical Provider, MD    Allergies: No Known Allergies  History   Social History  . Marital Status: Married    Spouse Name: N/A    Number of Children: N/A  . Years of Education: N/A   Occupational History  . Not on file.   Social History Main Topics  . Smoking status: Never Smoker   .  Smokeless tobacco: Not on file  . Alcohol Use: Not on file  . Drug Use: Not on file  . Sexually Active: Not on file   Other Topics Concern  . Not on file   Social History Narrative  . No narrative on file     Family History  Problem Relation Age of Onset  . Hypertension Father      ROS:  Please see the history of present illness.     All other systems reviewed and negative.    Physical Exam: Blood pressure 108/70, pulse 87, height 5\' 8"  (1.727 m), weight 176 lb 4 oz (79.946 kg). General: Well developed, well nourished male in no acute distress. Head:  Normocephalic, atraumatic, sclera non-icteric, no xanthomas, nares are without discharge. EENT: normal Lymph Nodes:  none Back: without scoliosis/kyphosis, no CVA tendersness Neck: Negative for carotid bruits. JVD not elevated. Lungs: Clear bilaterally to auscultation without wheezes, rales, or rhonchi. Breathing is unlabored. Heart: RRR with S1 S2.  2/6 systolic murmur , rubs, or gallops appreciated. Abdomen: Soft, non-tender, non-distended with normoactive bowel sounds. No hepatomegaly. No rebound/guarding. No obvious abdominal masses. Msk:  Strength and tone appear normal for age. Extremities: No clubbing or cyanosis. No  edema.  Distal pedal pulses are 2+ and equal bilaterally. Skin: Warm and Dry Neuro: Alert and oriented X 3. CN III-XII intact Grossly normal sensory and motor function . Psych:  Responds to questions appropriately with a normal affect.      Labs:   EKG:  nsr 87 17/09/37    Assessment and Plan:    Sherryl Manges

## 2012-08-01 ENCOUNTER — Telehealth: Payer: Self-pay

## 2012-08-01 MED ORDER — METOPROLOL TARTRATE 25 MG PO TABS: 25.0000 mg | ORAL_TABLET | Freq: Two times a day (BID) | ORAL | Status: DC

## 2012-08-01 NOTE — Telephone Encounter (Signed)
I called pt to make sure he understood instructions from yesterday's visit with Dr. Graciela Husbands He was under the impression that he was to resume metoprolol tartrate at 25 mg qd, but I explained Dr. Odessa Fleming note suggests BID He will continue taking just once daily until I clarify with Dr. Graciela Husbands He says he had a very good night last night, slept well, and HR was well controlled He will continue to monitor BP and will let us know prior to appt with [place] 4/11 if BP does not tolerate

## 2012-08-01 NOTE — Addendum Note (Signed)
Addended by: Sabino Snipes E on: 08/01/2012 08:03 AM   Modules accepted: Orders, Medications

## 2012-08-01 NOTE — Patient Instructions (Addendum)
Your physician has recommended you make the following change in your medication:  -stop benazepril -start metoprolol tartrate 25 mg twice daily  Your physician wants you to follow-up in: 4/11 with Dr. Kirke Corin as scheduled. You will receive a reminder letter in the mail two months in advance. If you don't receive a letter, please call our office to schedule the follow-up appointment.

## 2012-08-05 NOTE — Telephone Encounter (Signed)
Lopressor is to be BID thanks

## 2012-08-06 NOTE — Telephone Encounter (Signed)
Pt informed Understanding verb 

## 2012-08-06 NOTE — Telephone Encounter (Signed)
See below

## 2012-08-17 ENCOUNTER — Encounter: Payer: Self-pay | Admitting: Cardiovascular Disease

## 2012-08-17 ENCOUNTER — Ambulatory Visit (INDEPENDENT_AMBULATORY_CARE_PROVIDER_SITE_OTHER): Payer: Medicare Other | Admitting: Cardiovascular Disease

## 2012-08-17 VITALS — BP 118/64 | HR 68 | Ht 68.5 in | Wt 175.2 lb

## 2012-08-17 DIAGNOSIS — I493 Ventricular premature depolarization: Secondary | ICD-10-CM

## 2012-08-17 DIAGNOSIS — I10 Essential (primary) hypertension: Secondary | ICD-10-CM

## 2012-08-17 DIAGNOSIS — I4949 Other premature depolarization: Secondary | ICD-10-CM

## 2012-08-17 DIAGNOSIS — R002 Palpitations: Secondary | ICD-10-CM

## 2012-08-17 MED ORDER — DILTIAZEM HCL ER COATED BEADS 360 MG PO CP24: 360.0000 mg | ORAL_CAPSULE | Freq: Every day | ORAL | Status: DC

## 2012-08-17 NOTE — Patient Instructions (Addendum)
Continue same medication. A new prescription was sent for Diltiazem ER 360 mg once daily.  Follow up in 6 month.

## 2012-08-17 NOTE — Progress Notes (Signed)
Primary care physician: Dr. Juanetta Gosling  HPI  This is a pleasant 72 year old male who is here today for a followup visit regarding  palpitations and PVCs. The patient has prolonged history of palpitations thought to be due to PVCs. He had a stress test a few years ago. He was placed on metoprolol with significant improvement in his symptoms up until recently when he started noticing increased skipping in his heart. He underwent an echocardiogram which showed normal LV systolic function without significant valvular abnormalities. There was mild left ventricular hypertrophy with mild diastolic dysfunction. Holter monitor showed excessive monomorphic PVCs of RVOT origin. He was switched from metoprolol to diltiazem with improvement of his palpitations. He was seen by Dr. Graciela Husbands for consultation rule out titrated the dose of diltiazem to 360 mg in resumed small dose metoprolol 25 mg twice daily due to increased resting heart rate. Since then, he reports significant improvement in his symptoms.  No Known Allergies   Current Outpatient Prescriptions on File Prior to Visit  Medication Sig Dispense Refill  . aspirin 81 MG tablet Take 81 mg by mouth daily.      . fluticasone (FLONASE) 50 MCG/ACT nasal spray Place 2 sprays into the nose as needed.       . hydrochlorothiazide (HYDRODIURIL) 25 MG tablet Take 25 mg by mouth daily.      . metoprolol tartrate (LOPRESSOR) 25 MG tablet Take 1 tablet (25 mg total) by mouth 2 (two) times daily.  180 tablet  3  . NON FORMULARY Cholestoff 1 tablets daily.      . Omega-3 Fatty Acids (FISH OIL) 1200 MG CAPS Take 2 capsules by mouth.      . Sodium Chloride-Sodium Bicarb (AYR SALINE NASAL NETI RINSE) 1.57 G PACK Place into the nose.       No current facility-administered medications on file prior to visit.     Past Medical History  Diagnosis Date  . Allergic rhinitis, cause unspecified   . Esophageal reflux   . Unspecified hyperplasia of prostate without urinary  obstruction and other lower urinary tract symptoms (LUTS)   . H/O diverticulitis of colon   . History of kidney stones   . Cancer     skin;history  . Acute sinusitis, unspecified   . Diarrhea   . PVC's (premature ventricular contractions)     Right ventricular outflow tract  . Other and unspecified hyperlipidemia   . Unspecified essential hypertension      Past Surgical History  Procedure Laterality Date  . Colonoscopy    . Repair imperforate anus / anorectoplasty    . Transurethral resection of prostate       Family History  Problem Relation Age of Onset  . Hypertension Father      History   Social History  . Marital Status: Married    Spouse Name: N/A    Number of Children: N/A  . Years of Education: N/A   Occupational History  . Not on file.   Social History Main Topics  . Smoking status: Never Smoker   . Smokeless tobacco: Not on file  . Alcohol Use: Not on file  . Drug Use: Not on file  . Sexually Active: Not on file   Other Topics Concern  . Not on file   Social History Narrative  . No narrative on file       PHYSICAL EXAM   BP 118/64  Pulse 68  Ht 5' 8.5" (1.74 m)  Wt 175 lb  4 oz (79.493 kg)  BMI 26.26 kg/m2  Constitutional: He is oriented to person, place, and time. He appears well-developed and well-nourished. No distress.  HENT: No nasal discharge.  Head: Normocephalic and atraumatic.  Eyes: Pupils are equal and round. Right eye exhibits no discharge. Left eye exhibits no discharge.  Neck: Normal range of motion. Neck supple. No JVD present. No thyromegaly present.  Cardiovascular: Normal rate, regular rhythm, normal heart sounds and. Exam reveals no gallop and no friction rub. No murmur heard.  Pulmonary/Chest: Effort normal and breath sounds normal. No stridor. No respiratory distress. He has no wheezes. He has no rales. He exhibits no tenderness.  Abdominal: Soft. Bowel sounds are normal. He exhibits no distension. There is no  tenderness. There is no rebound and no guarding.  Musculoskeletal: Normal range of motion. He exhibits no edema and no tenderness.  Neurological: He is alert and oriented to person, place, and time. Coordination normal.  Skin: Skin is warm and dry. No rash noted. He is not diaphoretic. No erythema. No pallor.  Psychiatric: He has a normal mood and affect. His behavior is normal. Judgment and thought content normal.      EKG: Sinus  Rhythm  -  Nonspecific T-abnormality.   ABNORMAL    ASSESSMENT AND PLAN

## 2012-08-17 NOTE — Assessment & Plan Note (Signed)
His blood pressure has been reasonably controlled after the recent changes in his medications.

## 2012-08-17 NOTE — Assessment & Plan Note (Signed)
Significant improvement in PVCs since the addition of diltiazem which should be continued at the current dose of 360 mg once daily. Would continue metoprolol 25 mg twice daily as well. If there is recurrent PVCs, an antiarrhythmic medication such as flecainide was recommended by Dr. Graciela Husbands after pursuing ischemic workup. At the present time he seems to be doing well and has no symptoms suggestive of angina.

## 2013-02-19 ENCOUNTER — Encounter (INDEPENDENT_AMBULATORY_CARE_PROVIDER_SITE_OTHER): Payer: Self-pay

## 2013-02-19 ENCOUNTER — Ambulatory Visit (INDEPENDENT_AMBULATORY_CARE_PROVIDER_SITE_OTHER): Payer: Medicare Other | Admitting: Cardiovascular Disease

## 2013-02-19 ENCOUNTER — Encounter: Payer: Self-pay | Admitting: Cardiovascular Disease

## 2013-02-19 VITALS — BP 138/78 | HR 67 | Ht 68.5 in | Wt 175.1 lb

## 2013-02-19 DIAGNOSIS — I4949 Other premature depolarization: Secondary | ICD-10-CM

## 2013-02-19 DIAGNOSIS — I493 Ventricular premature depolarization: Secondary | ICD-10-CM

## 2013-02-19 DIAGNOSIS — I10 Essential (primary) hypertension: Secondary | ICD-10-CM

## 2013-02-19 NOTE — Progress Notes (Signed)
Primary care physician: Dr. Juanetta Gosling  HPI  This is a pleasant 72 year old male who is here today for a followup visit regarding  palpitations and PVCs. The patient has prolonged history of palpitations thought to be due to PVCs. He had a stress test a few years ago.  Echocardiogram in 02/14 showed normal LV systolic function without significant valvular abnormalities. There was mild left ventricular hypertrophy with mild diastolic dysfunction. Holter monitor showed excessive monomorphic PVCs of RVOT origin. He was switched from metoprolol to diltiazem with improvement of his palpitations. He was seen by Dr. Graciela Husbands for consultation rule out titrated the dose of diltiazem to 360 mg in resumed small dose metoprolol 25 mg twice daily due to increased resting heart rate. He has been doing very well since than. No chest pain, dyspnea or palpitations.   No Known Allergies   Current Outpatient Prescriptions on File Prior to Visit  Medication Sig Dispense Refill  . aspirin 81 MG tablet Take 81 mg by mouth daily.      Marland Kitchen azelastine (ASTELIN) 137 MCG/SPRAY nasal spray Place 2 sprays into the nose as needed.       . diltiazem (DILTIAZEM HCL CD) 360 MG 24 hr capsule Take 1 capsule (360 mg total) by mouth daily.  90 capsule  3  . fluticasone (FLONASE) 50 MCG/ACT nasal spray Place 2 sprays into the nose as needed.       . hydrochlorothiazide (HYDRODIURIL) 25 MG tablet Take 25 mg by mouth daily.      . metoprolol tartrate (LOPRESSOR) 25 MG tablet Take 1 tablet (25 mg total) by mouth 2 (two) times daily.  180 tablet  3  . NON FORMULARY Cholestoff 2 tablets daily.      . Omega-3 Fatty Acids (FISH OIL) 1200 MG CAPS Take 3 capsules by mouth.       . Sodium Chloride-Sodium Bicarb (AYR SALINE NASAL NETI RINSE) 1.57 G PACK Place into the nose as needed.        No current facility-administered medications on file prior to visit.     Past Medical History  Diagnosis Date  . Allergic rhinitis, cause unspecified     . Esophageal reflux   . Unspecified hyperplasia of prostate without urinary obstruction and other lower urinary tract symptoms (LUTS)   . H/O diverticulitis of colon   . History of kidney stones   . Cancer     skin;history  . Acute sinusitis, unspecified   . Diarrhea   . PVC's (premature ventricular contractions)     Right ventricular outflow tract  . Other and unspecified hyperlipidemia   . Unspecified essential hypertension      Past Surgical History  Procedure Laterality Date  . Colonoscopy    . Repair imperforate anus / anorectoplasty    . Transurethral resection of prostate       Family History  Problem Relation Age of Onset  . Hypertension Father      History   Social History  . Marital Status: Married    Spouse Name: N/A    Number of Children: N/A  . Years of Education: N/A   Occupational History  . Not on file.   Social History Main Topics  . Smoking status: Never Smoker   . Smokeless tobacco: Not on file  . Alcohol Use: 0.6 oz/week    1 Glasses of wine per week  . Drug Use: No  . Sexual Activity: Not on file   Other Topics Concern  . Not  on file   Social History Narrative  . No narrative on file       PHYSICAL EXAM   BP 138/78  Pulse 67  Ht 5' 8.5" (1.74 m)  Wt 175 lb 2 oz (79.436 kg)  BMI 26.24 kg/m2  Constitutional: He is oriented to person, place, and time. He appears well-developed and well-nourished. No distress.  HENT: No nasal discharge.  Head: Normocephalic and atraumatic.  Eyes: Pupils are equal and round. Right eye exhibits no discharge. Left eye exhibits no discharge.  Neck: Normal range of motion. Neck supple. No JVD present. No thyromegaly present.  Cardiovascular: Normal rate, regular rhythm, normal heart sounds and. Exam reveals no gallop and no friction rub. No murmur heard.  Pulmonary/Chest: Effort normal and breath sounds normal. No stridor. No respiratory distress. He has no wheezes. He has no rales. He exhibits no  tenderness.  Abdominal: Soft. Bowel sounds are normal. He exhibits no distension. There is no tenderness. There is no rebound and no guarding.  Musculoskeletal: Normal range of motion. He exhibits no edema and no tenderness.  Neurological: He is alert and oriented to person, place, and time. Coordination normal.  Skin: Skin is warm and dry. No rash noted. He is not diaphoretic. No erythema. No pallor.  Psychiatric: He has a normal mood and affect. His behavior is normal. Judgment and thought content normal.      EKG: Sinus  Rhythm  -  Nonspecific T-abnormality.   ABNORMAL    ASSESSMENT AND PLAN

## 2013-02-19 NOTE — Assessment & Plan Note (Signed)
He reports occasional high reading in evening. Continue to Monitor. An ARB can be considered.

## 2013-02-19 NOTE — Patient Instructions (Signed)
Continue same medications.   Your physician wants you to follow-up in: 12 months.  You will receive a reminder letter in the mail two months in advance. If you don't receive a letter, please call our office to schedule the follow-up appointment.  

## 2013-02-19 NOTE — Assessment & Plan Note (Signed)
He is doing very well on Diltiazem and Metoprolol. If there is recurrent PVCs, an antiarrhythmic medication such as flecainide was recommended by Dr. Graciela Husbands after pursuing ischemic workup. At the present time he seems to be doing well and has no symptoms suggestive of angina. Follow up on a yearly basis.

## 2013-03-14 ENCOUNTER — Other Ambulatory Visit: Payer: Self-pay

## 2013-03-19 ENCOUNTER — Encounter: Payer: Self-pay | Admitting: Cardiovascular Disease

## 2013-03-21 ENCOUNTER — Other Ambulatory Visit: Payer: Self-pay

## 2013-03-21 DIAGNOSIS — I493 Ventricular premature depolarization: Secondary | ICD-10-CM

## 2013-03-21 MED ORDER — LOSARTAN POTASSIUM 50 MG PO TABS: 50.0000 mg | ORAL_TABLET | Freq: Every day | ORAL | Status: DC

## 2013-03-22 ENCOUNTER — Ambulatory Visit (INDEPENDENT_AMBULATORY_CARE_PROVIDER_SITE_OTHER): Payer: Medicare Other

## 2013-03-22 DIAGNOSIS — I4949 Other premature depolarization: Secondary | ICD-10-CM

## 2013-03-22 DIAGNOSIS — I493 Ventricular premature depolarization: Secondary | ICD-10-CM

## 2013-03-23 LAB — BASIC METABOLIC PANEL
BUN: 15 mg/dL (ref 8–27)
CO2: 25 mmol/L (ref 18–29)
Chloride: 102 mmol/L (ref 97–108)
Glucose: 193 mg/dL — ABNORMAL HIGH (ref 65–99)

## 2013-03-26 ENCOUNTER — Telehealth: Payer: Self-pay

## 2013-03-26 NOTE — Telephone Encounter (Signed)
Spoke w/ pt's wife.  She will relay info to pt and make sure he has a f/u appt w/ Dr. Juanetta Gosling soon.

## 2013-03-26 NOTE — Telephone Encounter (Signed)
Message copied by Marilynne Halsted on Tue Mar 26, 2013  9:37 AM ------      Message from: Lorine Bears A      Created: Sun Mar 24, 2013  7:54 PM       Inform patient that labs were fine but blood sugar was slightly high. Forward results to Dr. Juanetta Gosling and ask patient to follow up with him to ensure no diabetes. ------

## 2014-01-28 ENCOUNTER — Ambulatory Visit: Payer: Self-pay | Admitting: Family Medicine

## 2014-02-24 ENCOUNTER — Encounter: Payer: Self-pay | Admitting: Cardiovascular Disease

## 2014-02-24 ENCOUNTER — Ambulatory Visit (INDEPENDENT_AMBULATORY_CARE_PROVIDER_SITE_OTHER): Payer: Medicare Other | Admitting: Cardiovascular Disease

## 2014-02-24 VITALS — BP 124/69 | HR 48 | Ht 68.0 in | Wt 173.0 lb

## 2014-02-24 DIAGNOSIS — I493 Ventricular premature depolarization: Secondary | ICD-10-CM

## 2014-02-24 DIAGNOSIS — I10 Essential (primary) hypertension: Secondary | ICD-10-CM

## 2014-02-24 MED ORDER — DILTIAZEM HCL ER COATED BEADS 240 MG PO CP24: 240.0000 mg | ORAL_CAPSULE | Freq: Every day | ORAL | Status: DC

## 2014-02-24 NOTE — Assessment & Plan Note (Signed)
BP is well controlled. Continue same medications.

## 2014-02-24 NOTE — Patient Instructions (Signed)
Your physician has recommended you make the following change in your medication:  Decrease Diltiazem to 240 mg once daily    Your physician wants you to follow-up in: 1 year with Dr. Fletcher Anon. You will receive a reminder letter in the mail two months in advance. If you don't receive a letter, please call our office to schedule the follow-up appointment.

## 2014-02-24 NOTE — Assessment & Plan Note (Signed)
He is doing very well with no significant palpitations.  He is bradycardiac with mild dizziness. I decreased the dose of Diltiazem ER to 240 mg daily. Continue Metoprolol.

## 2014-02-24 NOTE — Progress Notes (Signed)
Primary care physician: Dr. Luan Pulling  HPI  This is a pleasant 73 year old male who is here today for a followup visit regarding  palpitations and PVCs. The patient has prolonged history of palpitations thought to be due to PVCs. He had a stress test a few years ago.  Echocardiogram in 02/14 showed normal LV systolic function without significant valvular abnormalities. There was mild left ventricular hypertrophy with mild diastolic dysfunction. Holter monitor showed excessive monomorphic PVCs of RVOT origin. He was switched from metoprolol to diltiazem with improvement of his palpitations. He was seen by Dr. Caryl Comes for consultation rule out titrated the dose of diltiazem to 360 mg in resumed small dose metoprolol 25 mg twice daily due to increased resting heart rate. He has been doing very well since than. No chest pain, dyspnea or palpitations. He has noted dizziness especially in AM. No syncope or presyncope.   No Known Allergies   Current Outpatient Prescriptions on File Prior to Visit  Medication Sig Dispense Refill  . aspirin 81 MG tablet Take 81 mg by mouth daily.      Marland Kitchen azelastine (ASTELIN) 137 MCG/SPRAY nasal spray Place 2 sprays into the nose as needed.       . diltiazem (DILTIAZEM HCL CD) 360 MG 24 hr capsule Take 1 capsule (360 mg total) by mouth daily.  90 capsule  3  . fluticasone (FLONASE) 50 MCG/ACT nasal spray Place 2 sprays into the nose as needed.       . hydrochlorothiazide (HYDRODIURIL) 25 MG tablet Take 25 mg by mouth daily.      . imiquimod (ALDARA) 5 % cream Apply topically at bedtime.       Marland Kitchen losartan (COZAAR) 50 MG tablet Take 1 tablet (50 mg total) by mouth daily.  90 tablet  3  . metoprolol tartrate (LOPRESSOR) 25 MG tablet Take 1 tablet (25 mg total) by mouth 2 (two) times daily.  180 tablet  3  . NON FORMULARY Cholestoff 2 tablets daily.      . Omega-3 Fatty Acids (FISH OIL) 1200 MG CAPS Take 3 capsules by mouth.       . Sodium Chloride-Sodium Bicarb (AYR SALINE  NASAL NETI RINSE) 1.57 G PACK Place into the nose as needed.        No current facility-administered medications on file prior to visit.     Past Medical History  Diagnosis Date  . Allergic rhinitis, cause unspecified   . Esophageal reflux   . Unspecified hyperplasia of prostate without urinary obstruction and other lower urinary tract symptoms (LUTS)   . H/O diverticulitis of colon   . History of kidney stones   . Cancer     skin;history  . Acute sinusitis, unspecified   . Diarrhea   . PVC's (premature ventricular contractions)     Right ventricular outflow tract  . Other and unspecified hyperlipidemia   . Unspecified essential hypertension      Past Surgical History  Procedure Laterality Date  . Colonoscopy    . Repair imperforate anus / anorectoplasty    . Transurethral resection of prostate       Family History  Problem Relation Age of Onset  . Hypertension Father      History   Social History  . Marital Status: Married    Spouse Name: N/A    Number of Children: N/A  . Years of Education: N/A   Occupational History  . Not on file.   Social History Main Topics  .  Smoking status: Never Smoker   . Smokeless tobacco: Not on file  . Alcohol Use: 0.6 oz/week    1 Glasses of wine per week  . Drug Use: No  . Sexual Activity: Not on file   Other Topics Concern  . Not on file   Social History Narrative  . No narrative on file       PHYSICAL EXAM   BP 124/69  Pulse 48  Ht 5\' 8"  (1.727 m)  Wt 173 lb (78.472 kg)  BMI 26.31 kg/m2  Constitutional: He is oriented to person, place, and time. He appears well-developed and well-nourished. No distress.  HENT: No nasal discharge.  Head: Normocephalic and atraumatic.  Eyes: Pupils are equal and round. Right eye exhibits no discharge. Left eye exhibits no discharge.  Neck: Normal range of motion. Neck supple. No JVD present. No thyromegaly present.  Cardiovascular: Normal rate, regular rhythm, normal  heart sounds and. Exam reveals no gallop and no friction rub. No murmur heard.  Pulmonary/Chest: Effort normal and breath sounds normal. No stridor. No respiratory distress. He has no wheezes. He has no rales. He exhibits no tenderness.  Abdominal: Soft. Bowel sounds are normal. He exhibits no distension. There is no tenderness. There is no rebound and no guarding.  Musculoskeletal: Normal range of motion. He exhibits no edema and no tenderness.  Neurological: He is alert and oriented to person, place, and time. Coordination normal.  Skin: Skin is warm and dry. No rash noted. He is not diaphoretic. No erythema. No pallor.  Psychiatric: He has a normal mood and affect. His behavior is normal. Judgment and thought content normal.      EKG: Marked sinus  Bradycardia  -Right bundle branch block.   ABNORMAL    ASSESSMENT AND PLAN

## 2014-05-28 DIAGNOSIS — L570 Actinic keratosis: Secondary | ICD-10-CM | POA: Diagnosis not present

## 2014-05-28 DIAGNOSIS — L821 Other seborrheic keratosis: Secondary | ICD-10-CM | POA: Diagnosis not present

## 2014-05-28 DIAGNOSIS — Z85828 Personal history of other malignant neoplasm of skin: Secondary | ICD-10-CM | POA: Diagnosis not present

## 2014-05-28 DIAGNOSIS — D225 Melanocytic nevi of trunk: Secondary | ICD-10-CM | POA: Diagnosis not present

## 2014-05-28 DIAGNOSIS — Z08 Encounter for follow-up examination after completed treatment for malignant neoplasm: Secondary | ICD-10-CM | POA: Diagnosis not present

## 2014-05-28 DIAGNOSIS — Z1283 Encounter for screening for malignant neoplasm of skin: Secondary | ICD-10-CM | POA: Diagnosis not present

## 2014-05-28 DIAGNOSIS — D485 Neoplasm of uncertain behavior of skin: Secondary | ICD-10-CM | POA: Diagnosis not present

## 2014-06-04 DIAGNOSIS — E785 Hyperlipidemia, unspecified: Secondary | ICD-10-CM | POA: Diagnosis not present

## 2014-06-04 DIAGNOSIS — I10 Essential (primary) hypertension: Secondary | ICD-10-CM | POA: Diagnosis not present

## 2014-06-04 DIAGNOSIS — N400 Enlarged prostate without lower urinary tract symptoms: Secondary | ICD-10-CM | POA: Diagnosis not present

## 2014-06-04 DIAGNOSIS — J302 Other seasonal allergic rhinitis: Secondary | ICD-10-CM | POA: Diagnosis not present

## 2014-06-04 DIAGNOSIS — I4949 Other premature depolarization: Secondary | ICD-10-CM | POA: Diagnosis not present

## 2014-06-05 DIAGNOSIS — R7309 Other abnormal glucose: Secondary | ICD-10-CM | POA: Diagnosis not present

## 2014-06-05 DIAGNOSIS — C4490 Unspecified malignant neoplasm of skin, unspecified: Secondary | ICD-10-CM | POA: Diagnosis not present

## 2014-06-05 DIAGNOSIS — I10 Essential (primary) hypertension: Secondary | ICD-10-CM | POA: Diagnosis not present

## 2014-06-05 DIAGNOSIS — E785 Hyperlipidemia, unspecified: Secondary | ICD-10-CM | POA: Diagnosis not present

## 2014-06-05 LAB — HEMOGLOBIN A1C: HEMOGLOBIN A1C: 6 % (ref 4.0–6.0)

## 2014-06-24 DIAGNOSIS — D485 Neoplasm of uncertain behavior of skin: Secondary | ICD-10-CM | POA: Diagnosis not present

## 2014-06-24 DIAGNOSIS — D2261 Melanocytic nevi of right upper limb, including shoulder: Secondary | ICD-10-CM | POA: Diagnosis not present

## 2014-06-24 DIAGNOSIS — D225 Melanocytic nevi of trunk: Secondary | ICD-10-CM | POA: Diagnosis not present

## 2014-07-28 DIAGNOSIS — Z125 Encounter for screening for malignant neoplasm of prostate: Secondary | ICD-10-CM | POA: Diagnosis not present

## 2014-07-28 DIAGNOSIS — R351 Nocturia: Secondary | ICD-10-CM | POA: Diagnosis not present

## 2014-07-28 DIAGNOSIS — N401 Enlarged prostate with lower urinary tract symptoms: Secondary | ICD-10-CM | POA: Diagnosis not present

## 2014-09-02 ENCOUNTER — Other Ambulatory Visit: Payer: Self-pay

## 2014-09-02 MED ORDER — HYDROCHLOROTHIAZIDE 25 MG PO TABS
25.0000 mg | ORAL_TABLET | Freq: Every morning | ORAL | Status: DC
Start: 2014-09-02 — End: 2015-10-26

## 2014-09-02 NOTE — Telephone Encounter (Signed)
Refill sent for HCTZ 25 mg

## 2014-09-26 ENCOUNTER — Other Ambulatory Visit: Payer: Self-pay

## 2014-09-26 MED ORDER — DILTIAZEM HCL ER COATED BEADS 240 MG PO CP24: 240.0000 mg | ORAL_CAPSULE | Freq: Every morning | ORAL | Status: DC

## 2014-09-26 NOTE — Telephone Encounter (Signed)
Refill sent for diltiazem

## 2014-10-07 ENCOUNTER — Other Ambulatory Visit: Payer: Self-pay

## 2014-10-08 ENCOUNTER — Ambulatory Visit (INDEPENDENT_AMBULATORY_CARE_PROVIDER_SITE_OTHER): Payer: Medicare Other | Admitting: Family Medicine

## 2014-10-08 ENCOUNTER — Other Ambulatory Visit: Payer: Self-pay

## 2014-10-08 ENCOUNTER — Encounter: Payer: Self-pay | Admitting: Family Medicine

## 2014-10-08 VITALS — BP 130/60 | HR 53 | Temp 97.8°F | Resp 16 | Ht 68.0 in | Wt 172.3 lb

## 2014-10-08 DIAGNOSIS — I493 Ventricular premature depolarization: Secondary | ICD-10-CM

## 2014-10-08 DIAGNOSIS — I10 Essential (primary) hypertension: Secondary | ICD-10-CM

## 2014-10-08 DIAGNOSIS — R739 Hyperglycemia, unspecified: Secondary | ICD-10-CM

## 2014-10-08 LAB — POCT GLYCOSYLATED HEMOGLOBIN (HGB A1C): Hemoglobin A1C: 5.8

## 2014-10-08 NOTE — Assessment & Plan Note (Signed)
Continue calorie reduction and exercise to lose weight.

## 2014-10-08 NOTE — Patient Instructions (Signed)
Continue medications.  Continue to reduced calories and exercise to lose weight.

## 2014-10-08 NOTE — Assessment & Plan Note (Signed)
Continue current medications. 

## 2014-10-08 NOTE — Progress Notes (Signed)
Subjective:    Patient ID: Kyle Vargas., male    DOB: 06-06-1940, 74 y.o.   MRN: [id number]  HPI: Kyle Vargas. is a 74 y.o. male presenting on 10/08/2014 for Hypertension and Gestational Diabetes   Hypertension This is a chronic problem. The current episode started more than 1 year ago. The problem is unchanged. The problem is controlled. Associated symptoms include palpitations (rare). Pertinent negatives include no chest pain, malaise/fatigue, orthopnea, peripheral edema, PND, shortness of breath or sweats. Risk factors for coronary artery disease include male gender. Past treatments include beta blockers, calcium channel blockers, diuretics and lifestyle changes. The current treatment provides moderate improvement. There are no compliance problems.    Pre-diabetes - Losing weight.  BSs are in normal range (avg-103).  No hypoglycemic episodes. Past Medical History  Diagnosis Date  . Allergic rhinitis, cause unspecified   . Esophageal reflux   . Unspecified hyperplasia of prostate without urinary obstruction and other lower urinary tract symptoms (LUTS)   . H/O diverticulitis of colon   . History of kidney stones   . Cancer     skin;history  . Acute sinusitis, unspecified   . Diarrhea   . PVC's (premature ventricular contractions)     Right ventricular outflow tract  . Other and unspecified hyperlipidemia   . Unspecified essential hypertension    History   Social History  . Marital Status: Married    Spouse Name: N/A  . Number of Children: N/A  . Years of Education: N/A   Occupational History  . Not on file.   Social History Main Topics  . Smoking status: Never Smoker   . Smokeless tobacco: Not on file  . Alcohol Use: 0.6 oz/week    1 Glasses of wine per week  . Drug Use: No  . Sexual Activity: Not on file   Other Topics Concern  . Not on file   Social History Narrative   Family History  Problem Relation Age of Onset  . Hypertension Father     Current Outpatient Prescriptions on File Prior to Visit  Medication Sig  . aspirin 81 MG tablet Take 81 mg by mouth daily.  Marland Kitchen diltiazem (CARDIZEM CD) 240 MG 24 hr capsule Take 1 capsule (240 mg total) by mouth every morning.  . fluticasone (FLONASE) 50 MCG/ACT nasal spray Place 2 sprays into the nose once.  Marland Kitchen glucose blood test strip ONETOUCH ULTRA BLUE (In Vitro Strip)  1 (one) Strip Strip check blood sugar once daily for 0 days  Quantity: 50;  Refills: 12   Ordered :02-Oct-2013  [name];  Started 03-Jul-2013 Active  . hydrochlorothiazide (HYDRODIURIL) 25 MG tablet Take 1 tablet (25 mg total) by mouth every morning.  . imiquimod (ALDARA) 5 % cream Apply topically at bedtime.   Marland Kitchen losartan (COZAAR) 50 MG tablet Take 1 tablet (50 mg total) by mouth daily.  . metoprolol tartrate (LOPRESSOR) 25 MG tablet Take 1 tablet by mouth 2 (two) times daily.  . NON FORMULARY Cholestoff 2 tablets daily.  . Omega-3 Fatty Acids (FISH OIL EXTRA STRENGTH) 1200 MG CAPS Take 3 capsules by mouth once.  . Sodium Chloride-Sodium Bicarb (AYR SALINE NASAL NETI RINSE) 1.57 G PACK Place into the nose as needed.    No current facility-administered medications on file prior to visit.    Review of Systems  Constitutional: Negative.  Negative for malaise/fatigue.  Eyes: Negative for visual disturbance.  Respiratory: Negative.  Negative for shortness of breath.  Cardiovascular: Positive for palpitations (rare). Negative for chest pain, orthopnea, leg swelling and PND.  Gastrointestinal: Negative.   Endocrine: Negative for polydipsia, polyphagia and polyuria.  Musculoskeletal: Negative.   Skin: Negative.   Neurological: Negative.    Per HPI unless specifically indicated above     Objective:    BP 130/60 mmHg  Pulse 53  Temp(Src) 97.8 F (36.6 C) (Oral)  Resp 16  Ht 5\' 8"  (1.727 m)  Wt 172 lb 4.8 oz (78.155 kg)  BMI 26.20 kg/m2  Wt Readings from Last 3 Encounters:  10/08/14 172 lb 4.8 oz (78.155  kg)  06/04/14 178 lb (80.74 kg)  06/04/14 178 lb (80.74 kg)    Physical Exam  Constitutional: He appears well-developed and well-nourished.  Eyes: Conjunctivae and EOM are normal. Pupils are equal, round, and reactive to light.  Fundoscopic exam:      The right eye shows no arteriolar narrowing, no AV nicking, no hemorrhage and no papilledema.       The left eye shows no arteriolar narrowing, no AV nicking, no hemorrhage and no papilledema.  Neck: Neck supple. No thyromegaly present.  Cardiovascular: Normal rate, regular rhythm and normal heart sounds.  Exam reveals no gallop and no friction rub.   No murmur heard. Pulmonary/Chest: Effort normal and breath sounds normal.  Abdominal: Soft. Bowel sounds are normal. He exhibits no mass. There is no tenderness. There is no rebound and no guarding.  Musculoskeletal: He exhibits no edema.  Lymphadenopathy:    He has no cervical adenopathy.  Vitals reviewed.  Results for orders placed or performed in visit on 10/08/14  POCT HgB A1C  Result Value Ref Range   Hemoglobin A1C 5.8       Assessment & Plan:   Problem List Items Addressed This Visit    Essential hypertension - Primary    Continue current medications.      Relevant Medications   metoprolol tartrate (LOPRESSOR) 25 MG tablet   PVC's (premature ventricular contractions)   Relevant Medications   metoprolol tartrate (LOPRESSOR) 25 MG tablet   Blood glucose elevated    Continue calorie reduction and exercise to lose weight.      Relevant Orders   POCT HgB A1C (Completed)      Meds ordered this encounter  Medications                    Follow up plan: Return in about 4 months (around 02/07/2015), or if symptoms worsen or fail to improve.

## 2014-11-24 DIAGNOSIS — M65872 Other synovitis and tenosynovitis, left ankle and foot: Secondary | ICD-10-CM | POA: Diagnosis not present

## 2014-11-24 DIAGNOSIS — L600 Ingrowing nail: Secondary | ICD-10-CM | POA: Diagnosis not present

## 2014-11-24 DIAGNOSIS — M65871 Other synovitis and tenosynovitis, right ankle and foot: Secondary | ICD-10-CM | POA: Diagnosis not present

## 2014-12-11 DIAGNOSIS — H2513 Age-related nuclear cataract, bilateral: Secondary | ICD-10-CM | POA: Diagnosis not present

## 2014-12-23 DIAGNOSIS — R310 Gross hematuria: Secondary | ICD-10-CM | POA: Diagnosis not present

## 2014-12-23 DIAGNOSIS — N201 Calculus of ureter: Secondary | ICD-10-CM | POA: Diagnosis not present

## 2014-12-23 DIAGNOSIS — N401 Enlarged prostate with lower urinary tract symptoms: Secondary | ICD-10-CM | POA: Diagnosis not present

## 2015-01-14 DIAGNOSIS — L821 Other seborrheic keratosis: Secondary | ICD-10-CM | POA: Diagnosis not present

## 2015-01-14 DIAGNOSIS — L570 Actinic keratosis: Secondary | ICD-10-CM | POA: Diagnosis not present

## 2015-01-23 ENCOUNTER — Ambulatory Visit (INDEPENDENT_AMBULATORY_CARE_PROVIDER_SITE_OTHER): Payer: Medicare Other | Admitting: Family Medicine

## 2015-01-23 ENCOUNTER — Encounter: Payer: Self-pay | Admitting: Family Medicine

## 2015-01-23 ENCOUNTER — Telehealth: Payer: Self-pay | Admitting: Family Medicine

## 2015-01-23 DIAGNOSIS — Z23 Encounter for immunization: Secondary | ICD-10-CM | POA: Diagnosis not present

## 2015-01-23 DIAGNOSIS — Z0000 Encounter for general adult medical examination without abnormal findings: Secondary | ICD-10-CM | POA: Diagnosis not present

## 2015-01-23 NOTE — Telephone Encounter (Signed)
Documented

## 2015-01-23 NOTE — Telephone Encounter (Signed)
Pt got  his shingles vaccine today at the drug store.

## 2015-01-23 NOTE — Progress Notes (Signed)
Patient: Kyle Sultana., Male    DOB: 03/04/1941, 74 y.o.   MRN: [id number] Visit Date: 01/23/2015  Today's Provider: Dicky Doe, MD   Chief Complaint  Patient presents with  . Medicare Wellness    Subsequent    Subjective:    Annual wellness visit Kyle Graefe Hoglund [name]. is a 74 y.o. male who presents today for his Subsequent Annual Wellness Visit. He feels well. He reports exercising . He reports he is sleeping well.   ----------------------------------------------------------- HPI  Review of Systems  Social History   Social History  . Marital Status: Married    Spouse Name: N/A  . Number of Children: N/A  . Years of Education: N/A   Occupational History  . Not on file.   Social History Main Topics  . Smoking status: Never Smoker   . Smokeless tobacco: Not on file  . Alcohol Use: 0.6 oz/week    1 Glasses of wine per week  . Drug Use: No  . Sexual Activity: Not on file   Other Topics Concern  . Not on file   Social History Narrative    Patient Active Problem List   Diagnosis Date Noted  . Blood glucose elevated 10/05/2014  . Rapid palpitations 07/31/2012  . PVC's (premature ventricular contractions)   . Essential hypertension     Past Surgical History  Procedure Laterality Date  . Colonoscopy    . Repair imperforate anus / anorectoplasty    . Transurethral resection of prostate      His family history includes Hypertension in his father.    Previous Medications   ASPIRIN 81 MG TABLET    Take 81 mg by mouth daily.   AZELASTINE (ASTELIN) 0.1 % NASAL SPRAY    Place 2 sprays into both nostrils 2 (two) times daily. Use in each nostril as directed   DILTIAZEM (CARDIZEM CD) 240 MG 24 HR CAPSULE    Take 1 capsule (240 mg total) by mouth every morning.   FLUTICASONE (FLONASE) 50 MCG/ACT NASAL SPRAY    Place 2 sprays into the nose once.   GLUCOSE BLOOD TEST STRIP    ONETOUCH ULTRA BLUE (In Vitro Strip)  1 (one) Strip Strip check blood sugar once daily for  0 days  Quantity: 50;  Refills: 12   Ordered :02-Oct-2013  Kyle Beach MD;  Started 03-Jul-2013 Active   HYDROCHLOROTHIAZIDE (HYDRODIURIL) 25 MG TABLET    Take 1 tablet (25 mg total) by mouth every morning.   IMIQUIMOD (ALDARA) 5 % CREAM    Apply topically at bedtime.    LOSARTAN (COZAAR) 50 MG TABLET    Take 1 tablet (50 mg total) by mouth daily.   METOPROLOL TARTRATE (LOPRESSOR) 25 MG TABLET    Take 1 tablet by mouth 2 (two) times daily.   NON FORMULARY    Cholestoff 2 tablets daily.   OMEGA-3 FATTY ACIDS (FISH OIL EXTRA STRENGTH) 1200 MG CAPS    Take 3 capsules by mouth once.   SODIUM CHLORIDE-SODIUM BICARB (AYR SALINE NASAL NETI RINSE) 1.57 G PACK    Place into the nose as needed.     Patient Care Team: Arlis Porta., MD as PCP - General (Unknown Physician Specialty)     Objective:   Vitals: BP 125/66 mmHg  Pulse 60  Temp(Src) 97.9 F (36.6 C) (Oral)  Resp 16  Ht 5' 6.8" (1.697 m)  Wt 171 lb 9.6 oz (77.837 kg)  BMI 27.03 kg/m2  Physical Exam  Activities  of Daily Living In your present state of health, do you have any difficulty performing the following activities: 01/23/2015  Hearing? N  Vision? N  Difficulty concentrating or making decisions? N  Walking or climbing stairs? N  Dressing or bathing? N  Doing errands, shopping? N  Preparing Food and eating ? N  Using the Toilet? N  In the past six months, have you accidently leaked urine? N  Do you have problems with loss of bowel control? N  Managing your Medications? N  Managing your Finances? N  Housekeeping or managing your Housekeeping? N    Fall Risk Assessment Fall Risk  01/23/2015 10/08/2014  Falls in the past year? No No     Patient reports there are not safety devices in place in shower at home.   Depression Screen PHQ 2/9 Scores 01/23/2015 10/08/2014  PHQ - 2 Score 0 0     MMSE MMSE - Mini Mental State Exam 01/23/2015  Orientation to time 5  Orientation to Place 5  Registration 3  Attention/  Calculation 5  Recall 2  Language- name 2 objects 2  Language- repeat 1  Language- follow 3 step command 3  Language- read & follow direction 1  Write a sentence 1  Copy design 1  Total score 29     Assessment & Plan:     Annual Wellness Visit  Reviewed patient's Family Medical History Reviewed and updated list of patient's medical providers Assessment of cognitive impairment was done Assessed patient's functional ability Established a written schedule for health screening [place] Completed and Reviewed  Exercise Activities and Dietary recommendations Goals    . Exercise 3x per week (30 min per time)     Patient wants to continue walking daily to stay healthy. He currently walks 30 mins five days a week.       Immunization History  Administered Date(s) Administered  . Influenza-Unspecified 03/09/2012, 03/14/2014  . Pneumococcal Conjugate-13 01/21/2014  . Pneumococcal Polysaccharide-23 03/09/2008  . Tdap 01/21/2014    Health Maintenance  Topic Date Due  . ZOSTAVAX  01/21/2002  . INFLUENZA VACCINE  12/08/2014  . COLONOSCOPY  11/17/2023  . TETANUS/TDAP  01/22/2024  . PNA vac Low Risk Adult  Completed     Discussed health benefits of physical activity, and encouraged him to engage in regular exercise appropriate for his age and condition.    ------------------------------------------------------------------------------------------------------------   Problem List Items Addressed This Visit    None       Kyle Beach, MD [facility]  01/23/2015

## 2015-01-23 NOTE — Patient Instructions (Signed)
Health Maintenance  Topic Date Due  . ZOSTAVAX  01/21/2002  . INFLUENZA VACCINE  12/08/2014  . COLONOSCOPY  11/17/2023  . TETANUS/TDAP  01/22/2024  . PNA vac Low Risk Adult  Completed

## 2015-02-03 ENCOUNTER — Other Ambulatory Visit: Payer: Self-pay | Admitting: Family Medicine

## 2015-02-03 DIAGNOSIS — I493 Ventricular premature depolarization: Secondary | ICD-10-CM

## 2015-02-03 MED ORDER — LOSARTAN POTASSIUM 50 MG PO TABS: 50.0000 mg | ORAL_TABLET | Freq: Every day | ORAL | Status: DC

## 2015-02-03 NOTE — Telephone Encounter (Signed)
Patient requesting refill. Per chart last filled 2014 by Dr. Fletcher Anon. Losartan 50 mg.

## 2015-02-03 NOTE — Telephone Encounter (Signed)
I have refilled.-jh

## 2015-02-26 ENCOUNTER — Encounter: Payer: Self-pay | Admitting: Cardiovascular Disease

## 2015-02-26 ENCOUNTER — Ambulatory Visit (INDEPENDENT_AMBULATORY_CARE_PROVIDER_SITE_OTHER): Payer: Medicare Other | Admitting: Cardiovascular Disease

## 2015-02-26 VITALS — BP 130/64 | HR 66 | Ht 68.0 in | Wt 172.8 lb

## 2015-02-26 DIAGNOSIS — I10 Essential (primary) hypertension: Secondary | ICD-10-CM | POA: Diagnosis not present

## 2015-02-26 DIAGNOSIS — I493 Ventricular premature depolarization: Secondary | ICD-10-CM | POA: Diagnosis not present

## 2015-02-26 NOTE — Assessment & Plan Note (Signed)
Blood pressure is controlled on current medications. 

## 2015-02-26 NOTE — Assessment & Plan Note (Signed)
These are reasonably controlled on current dose of metoprolol and diltiazem. He is no longer bradycardic after decreasing the dose of diltiazem last year. He has occasional palpitations. If these get worse, then we can obtain a 24-hour Holter monitor to quantify the PVCs. Previously he had 28,000 PVCs.

## 2015-02-26 NOTE — Progress Notes (Signed)
Primary care physician: Dr. Luan Pulling  HPI  This is a pleasant 74 year old male who is here today for a followup visit regarding  palpitations and PVCs. The patient has prolonged history of palpitations thought to be due to PVCs. Negative stress test in the past.  Echocardiogram in 02/14 showed normal LV systolic function without significant valvular abnormalities. There was mild left ventricular hypertrophy with mild diastolic dysfunction. Holter monitor showed excessive monomorphic PVCs of RVOT origin. He was seen in the past by Dr. Caryl Comes for consultation. Symptoms have been well-controlled on diltiazem and metoprolol. He has occasional palpitations but nothing excessive. No chest pain or shortness of breath. He continues to be very active and enjoys babysitting his grandkids.  Allergies  Allergen Reactions  . Dust Mite Extract     eye itching  . Other      Current Outpatient Prescriptions on File Prior to Visit  Medication Sig Dispense Refill  . aspirin 81 MG tablet Take 81 mg by mouth daily.    Marland Kitchen azelastine (ASTELIN) 0.1 % nasal spray Place 2 sprays into both nostrils 2 (two) times daily. Use in each nostril as directed    . diltiazem (CARDIZEM CD) 240 MG 24 hr capsule Take 1 capsule (240 mg total) by mouth every morning. 90 capsule 3  . fluticasone (FLONASE) 50 MCG/ACT nasal spray Place 2 sprays into the nose once.    Marland Kitchen glucose blood test strip ONETOUCH ULTRA BLUE (In Vitro Strip)  1 (one) Strip Strip check blood sugar once daily for 0 days  Quantity: 50;  Refills: 12   Ordered :02-Oct-2013  [name];  Started 03-Jul-2013 Active    . hydrochlorothiazide (HYDRODIURIL) 25 MG tablet Take 1 tablet (25 mg total) by mouth every morning. 90 tablet 3  . imiquimod (ALDARA) 5 % cream Apply topically at bedtime.     Marland Kitchen losartan (COZAAR) 50 MG tablet Take 1 tablet (50 mg total) by mouth daily. 90 tablet 3  . metoprolol tartrate (LOPRESSOR) 25 MG tablet Take 1 tablet by mouth 2 (two) times  daily.    . NON FORMULARY Cholestoff 2 tablets daily.    . Omega-3 Fatty Acids (FISH OIL EXTRA STRENGTH) 1200 MG CAPS Take 3 capsules by mouth once.    . Sodium Chloride-Sodium Bicarb (AYR SALINE NASAL NETI RINSE) 1.57 G PACK Place into the nose as needed.      No current facility-administered medications on file prior to visit.     Past Medical History  Diagnosis Date  . Allergic rhinitis, cause unspecified   . Esophageal reflux   . Unspecified hyperplasia of prostate without urinary obstruction and other lower urinary tract symptoms (LUTS)   . H/O diverticulitis of colon   . History of kidney stones   . Cancer ([place])     skin;history  . Acute sinusitis, unspecified   . Diarrhea   . PVC's (premature ventricular contractions)     Right ventricular outflow tract  . Other and unspecified hyperlipidemia   . Unspecified essential hypertension      Past Surgical History  Procedure Laterality Date  . Colonoscopy    . Repair imperforate anus / anorectoplasty    . Transurethral resection of prostate       Family History  Problem Relation Age of Onset  . Hypertension Father      Social History   Social History  . Marital Status: Married    Spouse Name: N/A  . Number of Children: N/A  . Years  of Education: N/A   Occupational History  . Not on file.   Social History Main Topics  . Smoking status: Never Smoker   . Smokeless tobacco: Not on file  . Alcohol Use: 0.6 oz/week    1 Glasses of wine per week  . Drug Use: No  . Sexual Activity: Not on file   Other Topics Concern  . Not on file   Social History Narrative       PHYSICAL EXAM   BP 130/64 mmHg  Pulse 66  Ht 5\' 8"  (1.727 m)  Wt 172 lb 12 oz (78.359 kg)  BMI 26.27 kg/m2  Constitutional: He is oriented to person, place, and time. He appears well-developed and well-nourished. No distress.  HENT: No nasal discharge.  Head: Normocephalic and atraumatic.  Eyes: Pupils are equal and round. Right eye  exhibits no discharge. Left eye exhibits no discharge.  Neck: Normal range of motion. Neck supple. No JVD present. No thyromegaly present.  Cardiovascular: Normal rate, regular rhythm, normal heart sounds and. Exam reveals no gallop and no friction rub. No murmur heard.  Pulmonary/Chest: Effort normal and breath sounds normal. No stridor. No respiratory distress. He has no wheezes. He has no rales. He exhibits no tenderness.  Abdominal: Soft. Bowel sounds are normal. He exhibits no distension. There is no tenderness. There is no rebound and no guarding.  Musculoskeletal: Normal range of motion. He exhibits no edema and no tenderness.  Neurological: He is alert and oriented to person, place, and time. Coordination normal.  Skin: Skin is warm and dry. No rash noted. He is not diaphoretic. No erythema. No pallor.  Psychiatric: He has a normal mood and affect. His behavior is normal. Judgment and thought content normal.      EKG: Normal sinus rhythm-Right bundle branch block.   ABNORMAL    ASSESSMENT AND PLAN

## 2015-02-26 NOTE — Patient Instructions (Signed)
Medication Instructions: Continue same medications.   Labwork: None.   Procedures/Testing: None.   Follow-Up: 1 year with Dr. Mone Commisso  Any Additional Special Instructions Will Be Listed Below (If Applicable).   

## 2015-03-23 DIAGNOSIS — Z872 Personal history of diseases of the skin and subcutaneous tissue: Secondary | ICD-10-CM | POA: Diagnosis not present

## 2015-03-23 DIAGNOSIS — Z08 Encounter for follow-up examination after completed treatment for malignant neoplasm: Secondary | ICD-10-CM | POA: Diagnosis not present

## 2015-03-23 DIAGNOSIS — Z1283 Encounter for screening for malignant neoplasm of skin: Secondary | ICD-10-CM | POA: Diagnosis not present

## 2015-03-23 DIAGNOSIS — L570 Actinic keratosis: Secondary | ICD-10-CM | POA: Diagnosis not present

## 2015-03-23 DIAGNOSIS — Z85828 Personal history of other malignant neoplasm of skin: Secondary | ICD-10-CM | POA: Diagnosis not present

## 2015-05-26 ENCOUNTER — Ambulatory Visit (INDEPENDENT_AMBULATORY_CARE_PROVIDER_SITE_OTHER): Payer: PPO | Admitting: Family Medicine

## 2015-05-26 ENCOUNTER — Encounter: Payer: Self-pay | Admitting: Family Medicine

## 2015-05-26 VITALS — BP 122/64 | HR 56 | Temp 98.1°F | Resp 16 | Ht 68.0 in | Wt 175.0 lb

## 2015-05-26 DIAGNOSIS — I493 Ventricular premature depolarization: Secondary | ICD-10-CM

## 2015-05-26 DIAGNOSIS — I10 Essential (primary) hypertension: Secondary | ICD-10-CM | POA: Diagnosis not present

## 2015-05-26 NOTE — Progress Notes (Signed)
Name: Kyle Vargas.   MRN: [id number]    DOB: [birth year]   Date:05/26/2015       Progress Note  Subjective  Chief Complaint  Chief Complaint  Patient presents with  . Hypertension    HPI Here for f/u of HBP.  Taking all meds.  BP is somewhat elevated to 150/75 in early afternoon.  He is feeling well without c/o other than some mild knee arthritis pain  No problem-specific assessment & plan notes found for this encounter.   Past Medical History  Diagnosis Date  . Allergic rhinitis, cause unspecified   . Esophageal reflux   . Unspecified hyperplasia of prostate without urinary obstruction and other lower urinary tract symptoms (LUTS)   . H/O diverticulitis of colon   . History of kidney stones   . Cancer ([place])     skin;history  . Acute sinusitis, unspecified   . Diarrhea   . PVC's (premature ventricular contractions)     Right ventricular outflow tract  . Other and unspecified hyperlipidemia   . Unspecified essential hypertension     Past Surgical History  Procedure Laterality Date  . Colonoscopy    . Repair imperforate anus / anorectoplasty    . Transurethral resection of prostate      Family History  Problem Relation Age of Onset  . Hypertension Father     Social History   Social History  . Marital Status: Married    Spouse Name: N/A  . Number of Children: N/A  . Years of Education: N/A   Occupational History  . Not on file.   Social History Main Topics  . Smoking status: Never Smoker   . Smokeless tobacco: Never Used  . Alcohol Use: 0.6 oz/week    1 Glasses of wine per week  . Drug Use: No  . Sexual Activity: Not on file   Other Topics Concern  . Not on file   Social History Narrative     Current outpatient prescriptions:  .  aspirin 81 MG tablet, Take 81 mg by mouth daily., Disp: , Rfl:  .  azelastine (ASTELIN) 0.1 % nasal spray, Place 2 sprays into both nostrils 2 (two) times daily. Use in each nostril as directed, Disp: , Rfl:  .   diltiazem (CARDIZEM CD) 240 MG 24 hr capsule, Take 1 capsule (240 mg total) by mouth every morning., Disp: 90 capsule, Rfl: 3 .  fluticasone (FLONASE) 50 MCG/ACT nasal spray, Place 2 sprays into the nose once., Disp: , Rfl:  .  glucose blood test strip, ONETOUCH ULTRA BLUE (In Vitro Strip)  1 (one) Strip Strip check blood sugar once daily for 0 days  Quantity: 50;  Refills: 12   Ordered :02-Oct-2013  [name];  Started 03-Jul-2013 Active, Disp: , Rfl:  .  hydrochlorothiazide (HYDRODIURIL) 25 MG tablet, Take 1 tablet (25 mg total) by mouth every morning., Disp: 90 tablet, Rfl: 3 .  imiquimod (ALDARA) 5 % cream, Apply topically at bedtime. , Disp: , Rfl:  .  losartan (COZAAR) 50 MG tablet, Take 1 tablet (50 mg total) by mouth daily., Disp: 90 tablet, Rfl: 3 .  metoprolol tartrate (LOPRESSOR) 25 MG tablet, Take 1 tablet by mouth 2 (two) times daily., Disp: , Rfl:  .  NON FORMULARY, Cholestoff 2 tablets daily., Disp: , Rfl:  .  Omega-3 Fatty Acids (FISH OIL EXTRA STRENGTH) 1200 MG CAPS, Take 3 capsules by mouth once., Disp: , Rfl:  .  Sodium Chloride-Sodium Bicarb (AYR SALINE  NASAL NETI RINSE) 1.57 G PACK, Place into the nose as needed. , Disp: , Rfl:   Allergies  Allergen Reactions  . Dust Mite Extract     eye itching  . Other      Review of Systems  Constitutional: Negative for fever, chills, weight loss and malaise/fatigue.  HENT: Negative for hearing loss.   Eyes: Negative for blurred vision and double vision.  Respiratory: Negative for cough, shortness of breath and wheezing.   Cardiovascular: Negative for chest pain, palpitations and leg swelling.  Gastrointestinal: Negative for heartburn, abdominal pain and blood in stool.  Genitourinary: Negative for dysuria, urgency and frequency.  Musculoskeletal: Positive for joint pain (knee pain).  Skin: Negative for rash.  Neurological: Negative for dizziness, tremors, weakness and headaches.  Psychiatric/Behavioral: Negative for  depression.      Objective  Filed Vitals:   05/26/15 0924  BP: 122/64  Pulse: 56  Temp: 98.1 F (36.7 C)  TempSrc: Oral  Resp: 16  Height: 5\' 8"  (1.727 m)  Weight: 175 lb (79.379 kg)    Physical Exam  Constitutional: He is oriented to person, place, and time and well-developed, well-nourished, and in no distress. No distress.  HENT:  Head: Normocephalic and atraumatic.  Eyes: Conjunctivae and EOM are normal. Pupils are equal, round, and reactive to light. No scleral icterus.  Neck: Normal range of motion. Neck supple. Carotid bruit is not present. No thyromegaly present.  Cardiovascular: Normal rate, regular rhythm and normal heart sounds.  Exam reveals no gallop and no friction rub.   No murmur heard. Pulmonary/Chest: Effort normal and breath sounds normal. No respiratory distress. He has no wheezes. He has no rales.  Abdominal: Soft. Bowel sounds are normal. He exhibits no distension, no abdominal bruit and no mass. There is no tenderness.  Musculoskeletal: He exhibits no edema.  Lymphadenopathy:    He has no cervical adenopathy.  Neurological: He is alert and oriented to person, place, and time.  Vitals reviewed.      No results found for this or any previous visit (from the past 2160 hour(s)).   Assessment & Plan  Problem List Items Addressed This Visit    None      Meds ordered this encounter  Medications  . DISCONTD: Omega 3 1000 MG CAPS    Sig: Take 1 capsule by mouth daily.   1. Essential hypertension Cont. meds  2. PVC's (premature ventricular contractions)

## 2015-05-27 ENCOUNTER — Ambulatory Visit: Payer: Medicare Other | Admitting: Family Medicine

## 2015-07-22 DIAGNOSIS — R351 Nocturia: Secondary | ICD-10-CM | POA: Diagnosis not present

## 2015-07-22 DIAGNOSIS — N401 Enlarged prostate with lower urinary tract symptoms: Secondary | ICD-10-CM | POA: Diagnosis not present

## 2015-07-22 DIAGNOSIS — R3915 Urgency of urination: Secondary | ICD-10-CM | POA: Diagnosis not present

## 2015-07-22 DIAGNOSIS — R3916 Straining to void: Secondary | ICD-10-CM | POA: Diagnosis not present

## 2015-07-22 DIAGNOSIS — N400 Enlarged prostate without lower urinary tract symptoms: Secondary | ICD-10-CM | POA: Diagnosis not present

## 2015-10-26 ENCOUNTER — Ambulatory Visit (INDEPENDENT_AMBULATORY_CARE_PROVIDER_SITE_OTHER): Payer: PPO | Admitting: Family Medicine

## 2015-10-26 ENCOUNTER — Encounter: Payer: Self-pay | Admitting: Family Medicine

## 2015-10-26 VITALS — BP 133/62 | HR 59 | Temp 97.8°F | Resp 16 | Ht 68.0 in | Wt 174.0 lb

## 2015-10-26 DIAGNOSIS — R7303 Prediabetes: Secondary | ICD-10-CM

## 2015-10-26 DIAGNOSIS — I10 Essential (primary) hypertension: Secondary | ICD-10-CM

## 2015-10-26 DIAGNOSIS — R739 Hyperglycemia, unspecified: Secondary | ICD-10-CM

## 2015-10-26 DIAGNOSIS — E1169 Type 2 diabetes mellitus with other specified complication: Secondary | ICD-10-CM | POA: Insufficient documentation

## 2015-10-26 LAB — POCT GLYCOSYLATED HEMOGLOBIN (HGB A1C): Hemoglobin A1C: 5.9

## 2015-10-26 MED ORDER — HYDROCHLOROTHIAZIDE 25 MG PO TABS: ORAL_TABLET | ORAL | Status: DC

## 2015-10-26 NOTE — Progress Notes (Signed)
Name: Kyle Vargas.   MRN: [id number]    DOB: 1940/06/20   Date:10/26/2015       Progress Note  Subjective  Chief Complaint  Chief Complaint  Patient presents with  . Hyperglycemia    last A1C 5.8%    HPI Here for f/u of pre-diabetes and HBP.  His weight has been stable.  He is taking a 1/2 tablet of Metoprolol bid.  He occ. gets light headed when he bends over, esp working outside in heat.  Otherwise feeliog well.  No problem-specific assessment & plan notes found for this encounter.   Past Medical History  Diagnosis Date  . Allergic rhinitis, cause unspecified   . Esophageal reflux   . Unspecified hyperplasia of prostate without urinary obstruction and other lower urinary tract symptoms (LUTS)   . H/O diverticulitis of colon   . History of kidney stones   . Cancer ([place])     skin;history  . Acute sinusitis, unspecified   . Diarrhea   . PVC's (premature ventricular contractions)     Right ventricular outflow tract  . Other and unspecified hyperlipidemia   . Unspecified essential hypertension     Past Surgical History  Procedure Laterality Date  . Colonoscopy    . Repair imperforate anus / anorectoplasty    . Transurethral resection of prostate      Family History  Problem Relation Age of Onset  . Hypertension Father     Social History   Social History  . Marital Status: Married    Spouse Name: N/A  . Number of Children: N/A  . Years of Education: N/A   Occupational History  . Not on file.   Social History Main Topics  . Smoking status: Never Smoker   . Smokeless tobacco: Never Used  . Alcohol Use: 0.6 oz/week    1 Glasses of wine per week  . Drug Use: No  . Sexual Activity: Not on file   Other Topics Concern  . Not on file   Social History Narrative     Current outpatient prescriptions:  .  aspirin 81 MG tablet, Take 81 mg by mouth daily., Disp: , Rfl:  .  azelastine (ASTELIN) 0.1 % nasal spray, Place 2 sprays into both nostrils 2 (two)  times daily. Use in each nostril as directed, Disp: , Rfl:  .  diltiazem (CARDIZEM CD) 240 MG 24 hr capsule, Take 1 capsule (240 mg total) by mouth every morning., Disp: 90 capsule, Rfl: 3 .  fluticasone (FLONASE) 50 MCG/ACT nasal spray, Place 2 sprays into the nose once., Disp: , Rfl:  .  glucose blood test strip, ONETOUCH ULTRA BLUE (In Vitro Strip)  1 (one) Strip Strip check blood sugar once daily for 0 days  Quantity: 50;  Refills: 12   Ordered :02-Oct-2013  [name];  Started 03-Jul-2013 Active, Disp: , Rfl:  .  hydrochlorothiazide (HYDRODIURIL) 25 MG tablet, Take 1/2 tablet each AM, Disp: 90 tablet, Rfl: 3 .  imiquimod (ALDARA) 5 % cream, Apply topically at bedtime. , Disp: , Rfl:  .  losartan (COZAAR) 50 MG tablet, Take 1 tablet (50 mg total) by mouth daily., Disp: 90 tablet, Rfl: 3 .  metoprolol tartrate (LOPRESSOR) 25 MG tablet, Take 25 mg by mouth 2 (two) times daily. , Disp: , Rfl:  .  NON FORMULARY, Take 1 tablet by mouth daily. Cholestoff 2 tablets daily., Disp: , Rfl:  .  Omega-3 Fatty Acids (FISH OIL EXTRA STRENGTH) 1200 MG CAPS,  Take 3 capsules by mouth once., Disp: , Rfl:  .  Sodium Chloride-Sodium Bicarb (AYR SALINE NASAL NETI RINSE) 1.57 G PACK, Place into the nose as needed. , Disp: , Rfl:   Allergies  Allergen Reactions  . Dust Mite Extract     eye itching  . Other      Review of Systems  Constitutional: Negative for fever, chills, weight loss and malaise/fatigue.  HENT: Negative for hearing loss.   Eyes: Negative for blurred vision and double vision.  Respiratory: Negative for cough, shortness of breath and wheezing.   Cardiovascular: Negative for chest pain, palpitations and leg swelling.  Gastrointestinal: Negative for heartburn, abdominal pain and blood in stool.  Genitourinary: Negative for dysuria, urgency and frequency.  Musculoskeletal: Negative for myalgias and joint pain.  Skin: Negative for rash.  Neurological: Negative for dizziness, tremors,  weakness and headaches.      Objective  Filed Vitals:   10/26/15 0824  BP: 133/62  Pulse: 59  Temp: 97.8 F (36.6 C)  TempSrc: Oral  Resp: 16  Height: 5\' 8"  (1.727 m)  Weight: 174 lb (78.926 kg)    Physical Exam  Constitutional: He is oriented to person, place, and time and well-developed, well-nourished, and in no distress. No distress.  HENT:  Head: Normocephalic and atraumatic.  Eyes: Conjunctivae and EOM are normal. Pupils are equal, round, and reactive to light. No scleral icterus.  Neck: Normal range of motion. Neck supple. Carotid bruit is not present. No thyromegaly present.  Cardiovascular: Normal rate, regular rhythm and normal heart sounds.  Exam reveals no gallop and no friction rub.   No murmur heard. Pulmonary/Chest: Effort normal and breath sounds normal. No respiratory distress. He has no wheezes. He has no rales.  Abdominal: Soft. Bowel sounds are normal. He exhibits no distension, no abdominal bruit and no mass. There is no tenderness.  Musculoskeletal: He exhibits no edema.  Lymphadenopathy:    He has no cervical adenopathy.  Neurological: He is alert and oriented to person, place, and time.  Vitals reviewed.      Recent Results (from the past 2160 hour(s))  POCT HgB A1C     Status: Abnormal   Collection Time: 10/26/15  8:39 AM  Result Value Ref Range   Hemoglobin A1C 5.9%      Assessment & Plan  Problem List Items Addressed This Visit      Cardiovascular and Mediastinum   Essential hypertension   Relevant Medications   hydrochlorothiazide (HYDRODIURIL) 25 MG tablet     Other   Pre-diabetes   Hyperglycemia - Primary   Relevant Orders   POCT HgB A1C (Completed)      Meds ordered this encounter  Medications  . hydrochlorothiazide (HYDRODIURIL) 25 MG tablet    Sig: Take 1/2 tablet each AM    Dispense:  90 tablet    Refill:  3   1. Hyperglycemia  - POCT HgB A1C-5.9 Discussed sugar and starch reduction in diet. 2.  Pre-diabetes   3. Essential hypertension  - hydrochlorothiazide (HYDRODIURIL) 25 MG tablet; Take 1/2 tablet each AM  Dispense: 90 tablet; Refill: 3 Cont Metoprolol, Diltiazem and Losartan at current doses. RTC-3 months

## 2015-11-13 ENCOUNTER — Other Ambulatory Visit: Payer: Self-pay | Admitting: Family Medicine

## 2015-12-09 ENCOUNTER — Telehealth: Payer: Self-pay | Admitting: Family Medicine

## 2015-12-09 NOTE — Telephone Encounter (Signed)
At last visit, Dr. Luan Pulling cuts pt's dosage of hydrochlorothiazide back to 1/2 tablet.  He has been checking BP and it's averaging 134/60 and heart rate of 70.  His call back number is [phone]

## 2015-12-10 NOTE — Telephone Encounter (Signed)
Pt advised as per Dr. Hawkins. 

## 2015-12-10 NOTE — Telephone Encounter (Signed)
These BPs are ok as well as HR.  Cont current meds and keep appt.-jh

## 2015-12-17 DIAGNOSIS — H2513 Age-related nuclear cataract, bilateral: Secondary | ICD-10-CM | POA: Diagnosis not present

## 2015-12-19 ENCOUNTER — Other Ambulatory Visit: Payer: Self-pay | Admitting: Cardiovascular Disease

## 2016-02-04 ENCOUNTER — Encounter: Payer: Self-pay | Admitting: Family Medicine

## 2016-02-04 ENCOUNTER — Ambulatory Visit (INDEPENDENT_AMBULATORY_CARE_PROVIDER_SITE_OTHER): Payer: PPO | Admitting: Family Medicine

## 2016-02-04 VITALS — BP 136/70 | HR 61 | Temp 98.2°F | Resp 16 | Ht 68.0 in | Wt 171.6 lb

## 2016-02-04 DIAGNOSIS — Z23 Encounter for immunization: Secondary | ICD-10-CM

## 2016-02-04 DIAGNOSIS — R7303 Prediabetes: Secondary | ICD-10-CM | POA: Diagnosis not present

## 2016-02-04 DIAGNOSIS — I493 Ventricular premature depolarization: Secondary | ICD-10-CM | POA: Diagnosis not present

## 2016-02-04 DIAGNOSIS — I10 Essential (primary) hypertension: Secondary | ICD-10-CM | POA: Diagnosis not present

## 2016-02-04 LAB — POCT GLYCOSYLATED HEMOGLOBIN (HGB A1C): Hemoglobin A1C: 5.7

## 2016-02-04 MED ORDER — LOSARTAN POTASSIUM 50 MG PO TABS: 50.0000 mg | ORAL_TABLET | Freq: Every day | ORAL | 3 refills | Status: DC

## 2016-02-04 MED ORDER — METOPROLOL TARTRATE 25 MG PO TABS: 25.0000 mg | ORAL_TABLET | Freq: Two times a day (BID) | ORAL | 3 refills | Status: DC

## 2016-02-04 NOTE — Progress Notes (Signed)
Name: Kyle Vargas.   MRN: [id number]    DOB: 1940-12-28   Date:02/04/2016       Progress Note  Subjective  Chief Complaint  Chief Complaint  Patient presents with  . Hypertension  . Hyperglycemia    HPI Here for f/u of HBP and pre-diabetes.  Has changed diet and has lost weight.  He is feeling well overall.  He has had some sleep problems, but does well with Tylenol PM.   No problem-specific Assessment & Plan notes found for this encounter.   Past Medical History:  Diagnosis Date  . Acute sinusitis, unspecified   . Allergic rhinitis, cause unspecified   . Cancer ([place])    skin;history  . Diarrhea   . Esophageal reflux   . H/O diverticulitis of colon   . History of kidney stones   . Other and unspecified hyperlipidemia   . PVC's (premature ventricular contractions)    Right ventricular outflow tract  . Unspecified essential hypertension   . Unspecified hyperplasia of prostate without urinary obstruction and other lower urinary tract symptoms (LUTS)     Past Surgical History:  Procedure Laterality Date  . COLONOSCOPY    . REPAIR IMPERFORATE ANUS / ANORECTOPLASTY    . TRANSURETHRAL RESECTION OF PROSTATE      Family History  Problem Relation Age of Onset  . Hypertension Father     Social History   Social History  . Marital status: Married    Spouse name: N/A  . Number of children: N/A  . Years of education: N/A   Occupational History  . Not on file.   Social History Main Topics  . Smoking status: Never Smoker  . Smokeless tobacco: Never Used  . Alcohol use 0.6 oz/week    1 Glasses of wine per week  . Drug use: No  . Sexual activity: Not on file   Other Topics Concern  . Not on file   Social History Narrative  . No narrative on file     Current Outpatient Prescriptions:  .  aspirin 81 MG tablet, Take 81 mg by mouth daily., Disp: , Rfl:  .  azelastine (ASTELIN) 0.1 % nasal spray, Place 2 sprays into both nostrils 2 (two) times daily. Use in  each nostril as directed, Disp: , Rfl:  .  diltiazem (CARDIZEM CD) 240 MG 24 hr capsule, Take 1 capsule (240 mg total) by mouth every morning., Disp: 90 capsule, Rfl: 1 .  fluticasone (FLONASE) 50 MCG/ACT nasal spray, Place 2 sprays into the nose once., Disp: , Rfl:  .  glucose blood test strip, ONETOUCH ULTRA BLUE (In Vitro Strip)  1 (one) Strip Strip check blood sugar once daily for 0 days  Quantity: 50;  Refills: 12   Ordered :02-Oct-2013  [name];  Started 03-Jul-2013 Active, Disp: , Rfl:  .  hydrochlorothiazide (HYDRODIURIL) 25 MG tablet, Take 1/2 tablet each AM, Disp: 90 tablet, Rfl: 3 .  imiquimod (ALDARA) 5 % cream, Apply topically at bedtime. , Disp: , Rfl:  .  losartan (COZAAR) 50 MG tablet, Take 1 tablet (50 mg total) by mouth daily., Disp: 90 tablet, Rfl: 3 .  metoprolol tartrate (LOPRESSOR) 25 MG tablet, Take 1 tablet (25 mg total) by mouth 2 (two) times daily., Disp: 180 tablet, Rfl: 3 .  NON FORMULARY, Take 1 tablet by mouth daily. Cholestoff 2 tablets daily., Disp: , Rfl:  .  Omega-3 Fatty Acids (FISH OIL EXTRA STRENGTH) 1200 MG CAPS, Take 3 capsules by  mouth once., Disp: , Rfl:  .  Sodium Chloride-Sodium Bicarb (AYR SALINE NASAL NETI RINSE) 1.57 G PACK, Place into the nose as needed. , Disp: , Rfl:   Allergies  Allergen Reactions  . Dust Mite Extract     eye itching  . Other      Review of Systems  Constitutional: Negative for chills, fever, malaise/fatigue and weight loss.  HENT: Negative for hearing loss.   Eyes: Negative for blurred vision and double vision.  Respiratory: Negative for cough, shortness of breath and wheezing.   Cardiovascular: Negative for chest pain, palpitations and leg swelling.  Gastrointestinal: Negative for abdominal pain, blood in stool and heartburn.  Genitourinary: Negative for dysuria, frequency and urgency.  Musculoskeletal: Negative for joint pain and myalgias.  Skin: Negative for rash.  Neurological: Negative for dizziness,  tremors, weakness and headaches.      Objective  Vitals:   02/04/16 0827  BP: 136/70  Pulse: 61  Resp: 16  Temp: 98.2 F (36.8 C)  TempSrc: Oral  Weight: 171 lb 9.6 oz (77.8 kg)  Height: 5\' 8"  (1.727 m)    Physical Exam  Constitutional: He is oriented to person, place, and time and well-developed, well-nourished, and in no distress. No distress.  HENT:  Head: Normocephalic and atraumatic.  Eyes: Conjunctivae and EOM are normal. Pupils are equal, round, and reactive to light. No scleral icterus.  Neck: Normal range of motion. Neck supple. Carotid bruit is not present. No thyromegaly present.  Cardiovascular: Normal rate, regular rhythm and normal heart sounds.  Exam reveals no gallop and no friction rub.   No murmur heard. Pulmonary/Chest: Effort normal and breath sounds normal. No respiratory distress. He has no wheezes. He has no rales.  Abdominal: Soft. Bowel sounds are normal. He exhibits no distension and no mass. There is no tenderness.  Musculoskeletal: Normal range of motion. He exhibits no edema.  Lymphadenopathy:    He has no cervical adenopathy.  Neurological: He is alert and oriented to person, place, and time.  Vitals reviewed.      Recent Results (from the past 2160 hour(s))  POCT HgB A1C     Status: Normal   Collection Time: 02/04/16  8:53 AM  Result Value Ref Range   Hemoglobin A1C 5.7      Assessment & Plan  Problem List Items Addressed This Visit      Cardiovascular and Mediastinum   Essential hypertension   Relevant Medications   losartan (COZAAR) 50 MG tablet   metoprolol tartrate (LOPRESSOR) 25 MG tablet   PVC's (premature ventricular contractions)   Relevant Medications   losartan (COZAAR) 50 MG tablet   metoprolol tartrate (LOPRESSOR) 25 MG tablet     Other   Pre-diabetes - Primary   Relevant Orders   POCT HgB A1C (Completed)    Other Visit Diagnoses    Need for influenza vaccination       Relevant Orders   Flu vaccine HIGH  DOSE PF (Fluzone High dose) (Completed)   PVC (premature ventricular contraction)       Relevant Medications   losartan (COZAAR) 50 MG tablet   metoprolol tartrate (LOPRESSOR) 25 MG tablet      Meds ordered this encounter  Medications  . losartan (COZAAR) 50 MG tablet    Sig: Take 1 tablet (50 mg total) by mouth daily.    Dispense:  90 tablet    Refill:  3  . metoprolol tartrate (LOPRESSOR) 25 MG tablet    Sig: Take  1 tablet (25 mg total) by mouth 2 (two) times daily.    Dispense:  180 tablet    Refill:  3   1. Need for influenza vaccination  - Flu vaccine HIGH DOSE PF (Fluzone High dose)  2. Pre-diabetes  - POCT HgB A1C-5.7  3. Essential hypertension Cont HCTZ and Diltiazem.  - losartan (COZAAR) 50 MG tablet; Take 1 tablet (50 mg total) by mouth daily.  Dispense: 90 tablet; Refill: 3 - metoprolol tartrate (LOPRESSOR) 25 MG tablet; Take 1 tablet (25 mg total) by mouth 2 (two) times daily.  Dispense: 180 tablet; Refill: 3  4. PVC's (premature ventricular contractions)   5. PVC (premature ventricular contraction)  - metoprolol tartrate (LOPRESSOR) 25 MG tablet; Take 1 tablet (25 mg total) by mouth 2 (two) times daily.  Dispense: 180 tablet; Refill: 3

## 2016-02-08 ENCOUNTER — Encounter: Payer: Self-pay | Admitting: Cardiovascular Disease

## 2016-02-08 ENCOUNTER — Ambulatory Visit (INDEPENDENT_AMBULATORY_CARE_PROVIDER_SITE_OTHER): Payer: PPO | Admitting: Cardiovascular Disease

## 2016-02-08 VITALS — BP 140/60 | HR 63 | Ht 68.0 in | Wt 171.0 lb

## 2016-02-08 DIAGNOSIS — I10 Essential (primary) hypertension: Secondary | ICD-10-CM | POA: Diagnosis not present

## 2016-02-08 DIAGNOSIS — I493 Ventricular premature depolarization: Secondary | ICD-10-CM

## 2016-02-08 MED ORDER — DILTIAZEM HCL ER COATED BEADS 240 MG PO CP24
240.0000 mg | ORAL_CAPSULE | Freq: Every morning | ORAL | 1 refills | Status: DC
Start: 2016-02-08 — End: 2016-12-12

## 2016-02-08 NOTE — Patient Instructions (Signed)
Medication Instructions: Continue same medications.   Labwork: NONE.   Procedures/Testing: None.   Follow-Up: 1 year with Dr. Fletcher Anon.   Any Additional Special Instructions Will Be Listed Below (If Applicable).     If you need a refill on your cardiac medications before your next appointment, please call your pharmacy.

## 2016-02-08 NOTE — Progress Notes (Signed)
Cardiology Office Note   Date:  02/08/2016   ID:  Kyle Kung., DOB 16-Nov-1940, MRN [id number]  PCP:  Dicky Doe, MD  Cardiologist:   Kathlyn Sacramento, MD   Chief Complaint  Patient presents with  . other      History of Present Illness: Kyle Kung. is a 75 y.o. male who presents for  a followup visit regarding  palpitations and PVCs. The patient has prolonged history of palpitations thought to be due to PVCs. Negative stress test in the past.  Echocardiogram in 02/14 showed normal LV systolic function without significant valvular abnormalities. There was mild left ventricular hypertrophy with mild diastolic dysfunction. Holter monitor showed excessive monomorphic PVCs of RVOT origin. He has been doing very well with no chest pain or shortness of breath. He reports minimal palpitations at the present time with no dizziness, syncope or presyncope. He continues to be on the same medications. He continues to be very active and enjoys babysitting his grandkids.     Past Medical History:  Diagnosis Date  . Acute sinusitis, unspecified   . Allergic rhinitis, cause unspecified   . Cancer ([place])    skin;history  . Diarrhea   . Esophageal reflux   . H/O diverticulitis of colon   . History of kidney stones   . Other and unspecified hyperlipidemia   . PVC's (premature ventricular contractions)    Right ventricular outflow tract  . Unspecified essential hypertension   . Unspecified hyperplasia of prostate without urinary obstruction and other lower urinary tract symptoms (LUTS)     Past Surgical History:  Procedure Laterality Date  . COLONOSCOPY    . REPAIR IMPERFORATE ANUS / ANORECTOPLASTY    . TRANSURETHRAL RESECTION OF PROSTATE       Current Outpatient Prescriptions  Medication Sig Dispense Refill  . aspirin 81 MG tablet Take 81 mg by mouth daily.    Marland Kitchen azelastine (ASTELIN) 0.1 % nasal spray Place 2 sprays into both nostrils 2 (two) times daily. Use in  each nostril as directed    . diltiazem (CARDIZEM CD) 240 MG 24 hr capsule Take 1 capsule (240 mg total) by mouth every morning. 90 capsule 1  . fluticasone (FLONASE) 50 MCG/ACT nasal spray Place 2 sprays into the nose once.    Marland Kitchen glucose blood test strip ONETOUCH ULTRA BLUE (In Vitro Strip)  1 (one) Strip Strip check blood sugar once daily for 0 days  Quantity: 50;  Refills: 12   Ordered :02-Oct-2013  [name];  Started 03-Jul-2013 Active    . hydrochlorothiazide (HYDRODIURIL) 25 MG tablet Take 1/2 tablet each AM 90 tablet 3  . imiquimod (ALDARA) 5 % cream Apply topically at bedtime.     Marland Kitchen losartan (COZAAR) 50 MG tablet Take 1 tablet (50 mg total) by mouth daily. 90 tablet 3  . metoprolol tartrate (LOPRESSOR) 25 MG tablet Take 1 tablet (25 mg total) by mouth 2 (two) times daily. 180 tablet 3  . NON FORMULARY Take 1 tablet by mouth daily. Cholestoff 2 tablets daily.    . Omega-3 Fatty Acids (FISH OIL EXTRA STRENGTH) 1200 MG CAPS Take 3 capsules by mouth once.    . Sodium Chloride-Sodium Bicarb (AYR SALINE NASAL NETI RINSE) 1.57 G PACK Place into the nose as needed.      No current facility-administered medications for this visit.     Allergies:   Dust mite extract and Other    Social History:  The patient  reports that he has never smoked. He has never used smokeless tobacco. He reports that he drinks about 0.6 oz of alcohol per week . He reports that he does not use drugs.   Family History:  The patient's family history includes Hypertension in his father.    ROS:  Please see the history of present illness.   Otherwise, review of systems are positive for none.   All other systems are reviewed and negative.    PHYSICAL EXAM: VS:  BP 140/60 (BP Location: Left Arm, Patient Position: Sitting, Cuff Size: Normal)   Pulse 63   Ht 5\' 8"  (1.727 m)   Wt 171 lb (77.6 kg)   BMI 26.00 kg/m  , BMI Body mass index is 26 kg/m. GEN: Well nourished, well developed, in no acute distress    HEENT: normal  Neck: no JVD, carotid bruits, or masses Cardiac: RRR; no murmurs, rubs, or gallops,no edema  Respiratory:  clear to auscultation bilaterally, normal work of breathing GI: soft, nontender, nondistended, + BS MS: no deformity or atrophy  Skin: warm and dry, no rash Neuro:  Strength and sensation are intact Psych: euthymic mood, full affect   EKG:  EKG is ordered today. The ekg ordered today demonstrates normal sinus rhythm with right bundle branch block.   Recent Labs: No results found for requested labs within last 8760 hours.    Lipid Panel No results found for: CHOL, TRIG, HDL, CHOLHDL, VLDL, LDLCALC, LDLDIRECT    Wt Readings from Last 3 Encounters:  02/08/16 171 lb (77.6 kg)  02/04/16 171 lb 9.6 oz (77.8 kg)  10/26/15 174 lb (78.9 kg)       No flowsheet data found.    ASSESSMENT AND PLAN:  1.  Symptomatic PVCs: Well controlled on current dose of diltiazem and metoprolol with no side effects. He has no PVCs on his EKG and not by physical exam. Continue same medications. Previous ejection fraction was normal. I don't see an indication for repeat testing at the present time.  2. Essential hypertension: Blood pressure is reasonably controlled on current medications.  3. Family history of abdominal aortic aneurysm: The patient had an ultrasound done in 2015 which showed no evidence of aortic aneurysm with normal caliber. He is not a smoker and thus  repeat testing is not indicated.    Disposition:   FU with me in 1 year  Signed,  Kathlyn Sacramento, MD  02/08/2016 8:38 AM    [place]

## 2016-02-25 ENCOUNTER — Ambulatory Visit: Payer: Medicare Other | Admitting: Cardiovascular Disease

## 2016-02-26 ENCOUNTER — Ambulatory Visit: Payer: Medicare Other | Admitting: Cardiovascular Disease

## 2016-03-22 DIAGNOSIS — Z1283 Encounter for screening for malignant neoplasm of skin: Secondary | ICD-10-CM | POA: Diagnosis not present

## 2016-03-22 DIAGNOSIS — Z85828 Personal history of other malignant neoplasm of skin: Secondary | ICD-10-CM | POA: Diagnosis not present

## 2016-03-22 DIAGNOSIS — L570 Actinic keratosis: Secondary | ICD-10-CM | POA: Diagnosis not present

## 2016-03-22 DIAGNOSIS — Z872 Personal history of diseases of the skin and subcutaneous tissue: Secondary | ICD-10-CM | POA: Diagnosis not present

## 2016-03-22 DIAGNOSIS — Z08 Encounter for follow-up examination after completed treatment for malignant neoplasm: Secondary | ICD-10-CM | POA: Diagnosis not present

## 2016-03-22 DIAGNOSIS — L821 Other seborrheic keratosis: Secondary | ICD-10-CM | POA: Diagnosis not present

## 2016-04-21 DIAGNOSIS — R310 Gross hematuria: Secondary | ICD-10-CM | POA: Diagnosis not present

## 2016-04-21 DIAGNOSIS — M5489 Other dorsalgia: Secondary | ICD-10-CM | POA: Diagnosis not present

## 2016-04-21 DIAGNOSIS — N401 Enlarged prostate with lower urinary tract symptoms: Secondary | ICD-10-CM | POA: Diagnosis not present

## 2016-04-25 ENCOUNTER — Other Ambulatory Visit: Payer: Self-pay | Admitting: *Deleted

## 2016-04-25 ENCOUNTER — Ambulatory Visit (INDEPENDENT_AMBULATORY_CARE_PROVIDER_SITE_OTHER): Payer: PPO | Admitting: Family Medicine

## 2016-04-25 ENCOUNTER — Encounter: Payer: Self-pay | Admitting: Family Medicine

## 2016-04-25 VITALS — BP 145/75 | HR 73 | Temp 98.0°F | Resp 16 | Ht 68.0 in | Wt 174.0 lb

## 2016-04-25 DIAGNOSIS — I10 Essential (primary) hypertension: Secondary | ICD-10-CM

## 2016-04-25 DIAGNOSIS — I493 Ventricular premature depolarization: Secondary | ICD-10-CM

## 2016-04-25 DIAGNOSIS — R739 Hyperglycemia, unspecified: Secondary | ICD-10-CM

## 2016-04-25 MED ORDER — LOSARTAN POTASSIUM 100 MG PO TABS: 100.0000 mg | ORAL_TABLET | Freq: Every day | ORAL | 3 refills | Status: DC

## 2016-04-25 NOTE — Progress Notes (Signed)
Name: Kyle Vargas.   MRN: [id number]    DOB: Mar 15, 1941   Date:04/25/2016       Progress Note  Subjective  Chief Complaint  Chief Complaint  Patient presents with  . Hypertension  . scalp tingling    HPI Here for f/u of HBP.  Has w/u ongoing by Urol for hematuria.  BPs have been running a little high for 2 weeks.  !0000000 systolic.  \He has had some intermittent tingling of L top of scalp for past week or so.  No problem-specific Assessment & Plan notes found for this encounter.   Past Medical History:  Diagnosis Date  . Acute sinusitis, unspecified   . Allergic rhinitis, cause unspecified   . Cancer ([place])    skin;history  . Diarrhea   . Esophageal reflux   . H/O diverticulitis of colon   . History of kidney stones   . Other and unspecified hyperlipidemia   . PVC's (premature ventricular contractions)    Right ventricular outflow tract  . Unspecified essential hypertension   . Unspecified hyperplasia of prostate without urinary obstruction and other lower urinary tract symptoms (LUTS)     Past Surgical History:  Procedure Laterality Date  . COLONOSCOPY    . REPAIR IMPERFORATE ANUS / ANORECTOPLASTY    . TRANSURETHRAL RESECTION OF PROSTATE      Family History  Problem Relation Age of Onset  . Hypertension Father     Social History   Social History  . Marital status: Married    Spouse name: N/A  . Number of children: N/A  . Years of education: N/A   Occupational History  . Not on file.   Social History Main Topics  . Smoking status: Never Smoker  . Smokeless tobacco: Never Used  . Alcohol use 0.6 oz/week    1 Glasses of wine per week  . Drug use: No  . Sexual activity: Not on file   Other Topics Concern  . Not on file   Social History Narrative  . No narrative on file     Current Outpatient Prescriptions:  .  aspirin 81 MG tablet, Take 81 mg by mouth daily., Disp: , Rfl:  .  azelastine (ASTELIN) 0.1 % nasal spray, Place 2 sprays into  both nostrils 2 (two) times daily. Use in each nostril as directed, Disp: , Rfl:  .  diltiazem (CARDIZEM CD) 240 MG 24 hr capsule, Take 1 capsule (240 mg total) by mouth every morning., Disp: 90 capsule, Rfl: 1 .  fluticasone (FLONASE) 50 MCG/ACT nasal spray, Place 2 sprays into the nose once., Disp: , Rfl:  .  glucose blood test strip, ONETOUCH ULTRA BLUE (In Vitro Strip)  1 (one) Strip Strip check blood sugar once daily for 0 days  Quantity: 50;  Refills: 12   Ordered :02-Oct-2013  [name];  Started 03-Jul-2013 Active, Disp: , Rfl:  .  hydrochlorothiazide (HYDRODIURIL) 25 MG tablet, Take 1/2 tablet each AM (Patient taking differently: Take 12.5 mg by mouth daily. Take 1/2 tablet each AM), Disp: 90 tablet, Rfl: 3 .  imiquimod (ALDARA) 5 % cream, Apply topically at bedtime. , Disp: , Rfl:  .  losartan (COZAAR) 100 MG tablet, Take 1 tablet (100 mg total) by mouth daily., Disp: 90 tablet, Rfl: 3 .  metoprolol tartrate (LOPRESSOR) 25 MG tablet, Take 1 tablet (25 mg total) by mouth 2 (two) times daily., Disp: 180 tablet, Rfl: 3 .  NON FORMULARY, Take 1 tablet by mouth daily.  Cholestoff 2 tablets daily., Disp: , Rfl:  .  Omega-3 Fatty Acids (FISH OIL EXTRA STRENGTH) 1200 MG CAPS, Take 3 capsules by mouth once., Disp: , Rfl:  .  Sodium Chloride-Sodium Bicarb (AYR SALINE NASAL NETI RINSE) 1.57 G PACK, Place into the nose as needed. , Disp: , Rfl:  .  levofloxacin (LEVAQUIN) 500 MG tablet, Take 500 mg by mouth daily. On hold till 04/26/16, Disp: , Rfl:  .  predniSONE (DELTASONE) 50 MG tablet, Take 50 mg by mouth daily. On hold until 04/26/16, Disp: , Rfl:   Allergies  Allergen Reactions  . Dust Mite Extract     eye itching  . Other      Review of Systems  Constitutional: Negative for chills, fever, malaise/fatigue and weight loss.  HENT: Negative for hearing loss and tinnitus.   Eyes: Negative for blurred vision and double vision.  Respiratory: Negative for cough, shortness of breath and  wheezing.   Cardiovascular: Negative for chest pain, palpitations and leg swelling.  Gastrointestinal: Negative for abdominal pain, blood in stool and heartburn.  Genitourinary: Positive for hematuria. Negative for dysuria, frequency and urgency.  Musculoskeletal: Negative for joint pain and myalgias.  Skin: Negative for itching and rash.  Neurological: Positive for tingling (top of scalp). Negative for dizziness, tremors, weakness and headaches.      Objective  Vitals:   04/25/16 1059 04/25/16 1139  BP: (!) 145/70 (!) 145/75  Pulse: 73   Resp: 16   Temp: 98 F (36.7 C)   TempSrc: Oral   Weight: 174 lb (78.9 kg)   Height: 5\' 8"  (1.727 m)     Physical Exam  Constitutional: He is oriented to person, place, and time and well-developed, well-nourished, and in no distress. No distress.  HENT:  Head: Normocephalic and atraumatic.  Eyes: Conjunctivae and EOM are normal. Pupils are equal, round, and reactive to light. No scleral icterus.  Neck: Normal range of motion. Neck supple. Carotid bruit is not present. No thyromegaly present.  Cardiovascular: Normal rate, regular rhythm and normal heart sounds.  Exam reveals no gallop and no friction rub.   No murmur heard. Pulmonary/Chest: Effort normal and breath sounds normal. No respiratory distress. He has no wheezes. He has no rales.  Abdominal: Soft. Bowel sounds are normal. He exhibits no distension and no mass. There is no tenderness.  Musculoskeletal: Normal range of motion. He exhibits no edema.  Lymphadenopathy:    He has no cervical adenopathy.  Neurological: He is alert and oriented to person, place, and time. No cranial nerve deficit. He exhibits normal muscle tone. Gait normal. Coordination normal.  Vitals reviewed.      Recent Results (from the past 2160 hour(s))  POCT HgB A1C     Status: Normal   Collection Time: 02/04/16  8:53 AM  Result Value Ref Range   Hemoglobin A1C 5.7      Assessment & Plan  Problem List  Items Addressed This Visit      Cardiovascular and Mediastinum   Essential hypertension - Primary   Relevant Medications   losartan (COZAAR) 100 MG tablet   PVC's (premature ventricular contractions)   Relevant Medications   losartan (COZAAR) 100 MG tablet     Other   Hyperglycemia      Meds ordered this encounter  Medications  . losartan (COZAAR) 100 MG tablet    Sig: Take 1 tablet (100 mg total) by mouth daily.    Dispense:  90 tablet    Refill:  3   1. Essential hypertension  - losartan (COZAAR) 100 MG tablet; Take 1 tablet (100 mg total) by mouth daily.  Dispense: 90 tablet; Refill: 3-increase from 50 mg/d Cont HCTZ and Metoprolol 2. PVC's (premature ventricular contractions)   3. Hyperglycemia

## 2016-04-26 DIAGNOSIS — R310 Gross hematuria: Secondary | ICD-10-CM | POA: Diagnosis not present

## 2016-05-06 DIAGNOSIS — R310 Gross hematuria: Secondary | ICD-10-CM | POA: Diagnosis not present

## 2016-05-06 DIAGNOSIS — N401 Enlarged prostate with lower urinary tract symptoms: Secondary | ICD-10-CM | POA: Diagnosis not present

## 2016-05-06 DIAGNOSIS — N210 Calculus in bladder: Secondary | ICD-10-CM | POA: Diagnosis not present

## 2016-06-07 ENCOUNTER — Encounter: Payer: Self-pay | Admitting: Family Medicine

## 2016-06-07 ENCOUNTER — Ambulatory Visit (INDEPENDENT_AMBULATORY_CARE_PROVIDER_SITE_OTHER): Payer: PPO | Admitting: Family Medicine

## 2016-06-07 VITALS — BP 140/75 | HR 72 | Temp 98.1°F | Resp 16 | Ht 68.0 in | Wt 173.0 lb

## 2016-06-07 DIAGNOSIS — I10 Essential (primary) hypertension: Secondary | ICD-10-CM

## 2016-06-07 DIAGNOSIS — I493 Ventricular premature depolarization: Secondary | ICD-10-CM | POA: Diagnosis not present

## 2016-06-07 DIAGNOSIS — R7303 Prediabetes: Secondary | ICD-10-CM

## 2016-06-07 MED ORDER — METOPROLOL TARTRATE 25 MG PO TABS: ORAL_TABLET | ORAL | 3 refills | Status: DC

## 2016-06-07 NOTE — Patient Instructions (Signed)
Get fasting lab work done hedre 3-4 days prior to next office visit.

## 2016-06-07 NOTE — Progress Notes (Signed)
Name: Kyle Vargas.   MRN: [id number]    DOB: Aug 01, 1940   Date:06/07/2016       Progress Note  Subjective  Chief Complaint  Chief Complaint  Patient presents with  . Hypertension    HPI Here for f/u of HBP.  Has another appt in March for more through follow up. BPs in AM is 130-140sys; in PM running 145-155 sys No problem-specific Assessment & Plan notes found for this encounter.   Past Medical History:  Diagnosis Date  . Acute sinusitis, unspecified   . Allergic rhinitis, cause unspecified   . Cancer ([place])    skin;history  . Diarrhea   . Esophageal reflux   . H/O diverticulitis of colon   . History of kidney stones   . Other and unspecified hyperlipidemia   . PVC's (premature ventricular contractions)    Right ventricular outflow tract  . Unspecified essential hypertension   . Unspecified hyperplasia of prostate without urinary obstruction and other lower urinary tract symptoms (LUTS)     Past Surgical History:  Procedure Laterality Date  . COLONOSCOPY    . REPAIR IMPERFORATE ANUS / ANORECTOPLASTY    . TRANSURETHRAL RESECTION OF PROSTATE      Family History  Problem Relation Age of Onset  . Hypertension Father     Social History   Social History  . Marital status: Married    Spouse name: N/A  . Number of children: N/A  . Years of education: N/A   Occupational History  . Not on file.   Social History Main Topics  . Smoking status: Never Smoker  . Smokeless tobacco: Never Used  . Alcohol use 0.6 oz/week    1 Glasses of wine per week  . Drug use: No  . Sexual activity: Not on file   Other Topics Concern  . Not on file   Social History Narrative  . No narrative on file     Current Outpatient Prescriptions:  .  aspirin 81 MG tablet, Take 81 mg by mouth daily., Disp: , Rfl:  .  azelastine (ASTELIN) 0.1 % nasal spray, Place 2 sprays into both nostrils 2 (two) times daily. Use in each nostril as directed, Disp: , Rfl:  .  diltiazem (CARDIZEM  CD) 240 MG 24 hr capsule, Take 1 capsule (240 mg total) by mouth every morning., Disp: 90 capsule, Rfl: 1 .  fluticasone (FLONASE) 50 MCG/ACT nasal spray, Place 2 sprays into the nose once., Disp: , Rfl:  .  glucose blood test strip, ONETOUCH ULTRA BLUE (In Vitro Strip)  1 (one) Strip Strip check blood sugar once daily for 0 days  Quantity: 50;  Refills: 12   Ordered :02-Oct-2013  [name];  Started 03-Jul-2013 Active, Disp: , Rfl:  .  hydrochlorothiazide (HYDRODIURIL) 25 MG tablet, Take 1/2 tablet each AM (Patient taking differently: Take 12.5 mg by mouth daily. Take 1/2 tablet each AM), Disp: 90 tablet, Rfl: 3 .  imiquimod (ALDARA) 5 % cream, Apply topically at bedtime. , Disp: , Rfl:  .  losartan (COZAAR) 100 MG tablet, Take 1 tablet (100 mg total) by mouth daily., Disp: 90 tablet, Rfl: 3 .  metoprolol tartrate (LOPRESSOR) 25 MG tablet, Take 1.5 tabs twice daily, Disp: 270 tablet, Rfl: 3 .  NON FORMULARY, Take 1 tablet by mouth daily. Cholestoff 2 tablets daily., Disp: , Rfl:  .  Omega-3 Fatty Acids (FISH OIL EXTRA STRENGTH) 1200 MG CAPS, Take 3 capsules by mouth once., Disp: , Rfl:  .  Sodium Chloride-Sodium Bicarb (AYR SALINE NASAL NETI RINSE) 1.57 G PACK, Place into the nose as needed. , Disp: , Rfl:   Allergies  Allergen Reactions  . Dust Mite Extract     eye itching  . Other      Review of Systems  Constitutional: Negative for chills, fever, malaise/fatigue and weight loss.  HENT: Negative for hearing loss and tinnitus.   Eyes: Negative for blurred vision and double vision.  Respiratory: Negative for cough, shortness of breath and wheezing.   Cardiovascular: Negative for chest pain, palpitations and leg swelling.  Gastrointestinal: Negative for abdominal pain, blood in stool and heartburn.  Genitourinary: Negative for dysuria, frequency and urgency.  Musculoskeletal: Negative for joint pain and myalgias.  Skin: Negative for rash.  Neurological: Negative for dizziness,  tingling, tremors, weakness and headaches.      Objective  Vitals:   06/07/16 1001 06/07/16 1100  BP: (!) 138/59 140/75  Pulse: 63 72  Resp: 16   Temp: 98.1 F (36.7 C)   TempSrc: Oral   Weight: 173 lb (78.5 kg)   Height: 5\' 8"  (1.727 m)     Physical Exam  Constitutional: He is oriented to person, place, and time and well-developed, well-nourished, and in no distress. No distress.  HENT:  Head: Normocephalic and atraumatic.  Eyes: Conjunctivae and EOM are normal. Pupils are equal, round, and reactive to light. No scleral icterus.  Neck: Normal range of motion. Neck supple. Carotid bruit is not present. No thyromegaly present.  Cardiovascular: Normal rate, regular rhythm and normal heart sounds.  Exam reveals no gallop and no friction rub.   No murmur heard. Pulmonary/Chest: Effort normal and breath sounds normal. No respiratory distress. He has no wheezes. He has no rales.  Musculoskeletal: He exhibits no edema.  Lymphadenopathy:    He has no cervical adenopathy.  Neurological: He is alert and oriented to person, place, and time.  Vitals reviewed.      No results found for this or any previous visit (from the past 2160 hour(s)).   Assessment & Plan  Problem List Items Addressed This Visit      Cardiovascular and Mediastinum   Essential hypertension - Primary   Relevant Medications   metoprolol tartrate (LOPRESSOR) 25 MG tablet   Other Relevant Orders   COMPLETE METABOLIC PANEL WITH GFR   CBC with Differential   Lipid Profile     Other   Pre-diabetes   Relevant Orders   HgB A1c    Other Visit Diagnoses    PVC (premature ventricular contraction)       Relevant Medications   metoprolol tartrate (LOPRESSOR) 25 MG tablet      Meds ordered this encounter  Medications  . metoprolol tartrate (LOPRESSOR) 25 MG tablet    Sig: Take 1.5 tabs twice daily    Dispense:  270 tablet    Refill:  3   1. Essential hypertension Cont HCTZ, Losartan and Cardizem -  metoprolol tartrate (LOPRESSOR) 25 MG tablet; Take 1.5 tabs twice daily  Dispense: 270 tablet; Refill: 3 - increased from 25 mg bid. - COMPLETE METABOLIC PANEL WITH GFR-ptr - CBC with Differential-ptr - Lipid Profile-ptr  2. Pre-diabetes  - HgB A1c-ptr  3. PVC (premature ventricular contraction)  - metoprolol tartrate (LOPRESSOR) 25 MG tablet; Take 1.5 tabs twice daily  Dispense: 270 tablet; Refill: 3

## 2016-07-06 ENCOUNTER — Other Ambulatory Visit: Payer: Self-pay | Admitting: Family Medicine

## 2016-07-06 ENCOUNTER — Other Ambulatory Visit: Payer: PPO

## 2016-07-06 DIAGNOSIS — R7303 Prediabetes: Secondary | ICD-10-CM | POA: Diagnosis not present

## 2016-07-06 DIAGNOSIS — I10 Essential (primary) hypertension: Secondary | ICD-10-CM | POA: Diagnosis not present

## 2016-07-06 LAB — CBC WITH DIFFERENTIAL/PLATELET
BASOS PCT: 1 %
Basophils Absolute: 64 cells/uL (ref 0–200)
EOS ABS: 192 {cells}/uL (ref 15–500)
Eosinophils Relative: 3 %
HCT: 43.8 % (ref 38.5–50.0)
Hemoglobin: 14.6 g/dL (ref 13.2–17.1)
LYMPHS PCT: 22 %
Lymphs Abs: 1408 cells/uL (ref 850–3900)
MCH: 31.1 pg (ref 27.0–33.0)
MCHC: 33.3 g/dL (ref 32.0–36.0)
MCV: 93.2 fL (ref 80.0–100.0)
MONO ABS: 576 {cells}/uL (ref 200–950)
MONOS PCT: 9 %
MPV: 9.7 fL (ref 7.5–12.5)
NEUTROS ABS: 4160 {cells}/uL (ref 1500–7800)
Neutrophils Relative %: 65 %
Platelets: 191 10*3/uL (ref 140–400)
RBC: 4.7 MIL/uL (ref 4.20–5.80)
RDW: 14.5 % (ref 11.0–15.0)
WBC: 6.4 10*3/uL (ref 3.8–10.8)

## 2016-07-06 LAB — HEMOGLOBIN A1C
HEMOGLOBIN A1C: 5.7 % — AB (ref <5.7)
Mean Plasma Glucose: 117 mg/dL

## 2016-07-07 ENCOUNTER — Ambulatory Visit (INDEPENDENT_AMBULATORY_CARE_PROVIDER_SITE_OTHER): Payer: PPO | Admitting: Family Medicine

## 2016-07-07 ENCOUNTER — Encounter: Payer: Self-pay | Admitting: Family Medicine

## 2016-07-07 VITALS — BP 140/70 | HR 69 | Temp 98.1°F | Resp 16 | Ht 68.0 in | Wt 172.0 lb

## 2016-07-07 DIAGNOSIS — R7303 Prediabetes: Secondary | ICD-10-CM | POA: Diagnosis not present

## 2016-07-07 DIAGNOSIS — I10 Essential (primary) hypertension: Secondary | ICD-10-CM

## 2016-07-07 LAB — COMPLETE METABOLIC PANEL WITH GFR
ALT: 25 U/L (ref 9–46)
AST: 17 U/L (ref 10–35)
Albumin: 3.7 g/dL (ref 3.6–5.1)
Alkaline Phosphatase: 111 U/L (ref 40–115)
BILIRUBIN TOTAL: 0.5 mg/dL (ref 0.2–1.2)
BUN: 18 mg/dL (ref 7–25)
CALCIUM: 9.1 mg/dL (ref 8.6–10.3)
CHLORIDE: 107 mmol/L (ref 98–110)
CO2: 21 mmol/L (ref 20–31)
Creat: 1.02 mg/dL (ref 0.70–1.18)
GFR, EST AFRICAN AMERICAN: 83 mL/min (ref >=60)
GFR, EST NON AFRICAN AMERICAN: 72 mL/min (ref >=60)
Glucose, Bld: 104 mg/dL — ABNORMAL HIGH (ref 65–99)
Potassium: 3.6 mmol/L (ref 3.5–5.3)
Sodium: 142 mmol/L (ref 135–146)
Total Protein: 6.3 g/dL (ref 6.1–8.1)

## 2016-07-07 LAB — LIPID PANEL
CHOL/HDL RATIO: 3.9 ratio (ref <5.0)
Cholesterol: 145 mg/dL (ref <200)
HDL: 37 mg/dL — AB (ref >40)
LDL Cholesterol: 63 mg/dL (ref <100)
TRIGLYCERIDES: 227 mg/dL — AB (ref <150)
VLDL: 45 mg/dL — ABNORMAL HIGH (ref <30)

## 2016-07-07 NOTE — Progress Notes (Signed)
Name: Kyle Vargas.   MRN: [id number]    DOB: 1941-01-30   Date:07/07/2016       Progress Note  Subjective  Chief Complaint  Chief Complaint  Patient presents with  . Hypertension    HPI Here for f/u of HBP.  He is taking meds and feels welll.  BPs at home running from 135-170 sys.  Most < 150 sys.  Pulses from 60-70. No problem-specific Assessment & Plan notes found for this encounter.   Past Medical History:  Diagnosis Date  . Acute sinusitis, unspecified   . Allergic rhinitis, cause unspecified   . Cancer ([place])    skin;history  . Diarrhea   . Esophageal reflux   . H/O diverticulitis of colon   . History of kidney stones   . Other and unspecified hyperlipidemia   . PVC's (premature ventricular contractions)    Right ventricular outflow tract  . Unspecified essential hypertension   . Unspecified hyperplasia of prostate without urinary obstruction and other lower urinary tract symptoms (LUTS)     Past Surgical History:  Procedure Laterality Date  . COLONOSCOPY    . REPAIR IMPERFORATE ANUS / ANORECTOPLASTY    . TRANSURETHRAL RESECTION OF PROSTATE      Family History  Problem Relation Age of Onset  . Hypertension Father     Social History   Social History  . Marital status: Married    Spouse name: N/A  . Number of children: N/A  . Years of education: N/A   Occupational History  . Not on file.   Social History Main Topics  . Smoking status: Never Smoker  . Smokeless tobacco: Never Used  . Alcohol use 0.6 oz/week    1 Glasses of wine per week  . Drug use: No  . Sexual activity: Not on file   Other Topics Concern  . Not on file   Social History Narrative  . No narrative on file     Current Outpatient Prescriptions:  .  aspirin 81 MG tablet, Take 81 mg by mouth daily., Disp: , Rfl:  .  azelastine (ASTELIN) 0.1 % nasal spray, Place 2 sprays into both nostrils 2 (two) times daily. Use in each nostril as directed, Disp: , Rfl:  .  diltiazem  (CARDIZEM CD) 240 MG 24 hr capsule, Take 1 capsule (240 mg total) by mouth every morning., Disp: 90 capsule, Rfl: 1 .  fluticasone (FLONASE) 50 MCG/ACT nasal spray, Place 2 sprays into the nose once., Disp: , Rfl:  .  glucose blood test strip, ONETOUCH ULTRA BLUE (In Vitro Strip)  1 (one) Strip Strip check blood sugar once daily for 0 days  Quantity: 50;  Refills: 12   Ordered :02-Oct-2013  [name];  Started 03-Jul-2013 Active, Disp: , Rfl:  .  hydrochlorothiazide (HYDRODIURIL) 25 MG tablet, Take 1/2 tablet each AM (Patient taking differently: Take 12.5 mg by mouth daily. Take 1/2 tablet each AM), Disp: 90 tablet, Rfl: 3 .  imiquimod (ALDARA) 5 % cream, Apply topically at bedtime. , Disp: , Rfl:  .  losartan (COZAAR) 100 MG tablet, Take 1 tablet (100 mg total) by mouth daily., Disp: 90 tablet, Rfl: 3 .  metoprolol tartrate (LOPRESSOR) 25 MG tablet, Take 1.5 tabs twice daily, Disp: 270 tablet, Rfl: 3 .  NON FORMULARY, Take 1 tablet by mouth daily. Cholestoff 2 tablets daily., Disp: , Rfl:  .  Omega-3 Fatty Acids (FISH OIL EXTRA STRENGTH) 1200 MG CAPS, Take 3 capsules by mouth once.,  Disp: , Rfl:  .  Sodium Chloride-Sodium Bicarb (AYR SALINE NASAL NETI RINSE) 1.57 G PACK, Place into the nose as needed. , Disp: , Rfl:   Allergies  Allergen Reactions  . Dust Mite Extract     eye itching  . Other      Review of Systems  Constitutional: Negative for chills, fever, malaise/fatigue and weight loss.  HENT: Negative for hearing loss and tinnitus.   Eyes: Negative for blurred vision and double vision.  Respiratory: Negative for cough, shortness of breath and wheezing.   Cardiovascular: Negative for chest pain, palpitations and leg swelling.  Gastrointestinal: Negative for abdominal pain, blood in stool and heartburn.  Genitourinary: Negative for dysuria, frequency and urgency.  Musculoskeletal: Negative for joint pain and myalgias.  Skin: Negative for rash.  Neurological: Negative for  dizziness, tingling, tremors, weakness and headaches.      Objective  Vitals:   07/07/16 1016 07/07/16 1124  BP: (!) 141/67 140/70  Pulse: 69   Resp: 16   Temp: 98.1 F (36.7 C)   TempSrc: Oral   Weight: 172 lb (78 kg)   Height: 5' 8"  (1.727 m)     Physical Exam  Constitutional: He is oriented to person, place, and time and well-developed, well-nourished, and in no distress. No distress.  HENT:  Head: Normocephalic and atraumatic.  Eyes: Conjunctivae and EOM are normal. Pupils are equal, round, and reactive to light. No scleral icterus.  Neck: Normal range of motion. Neck supple. Carotid bruit is not present. No thyromegaly present.  Cardiovascular: Normal rate and regular rhythm.  Exam reveals no gallop and no friction rub.   No murmur heard. Pulmonary/Chest: Effort normal and breath sounds normal. No respiratory distress. He has no wheezes. He has no rales.  Abdominal: Soft. Bowel sounds are normal. He exhibits no distension and no mass. There is no tenderness.  Musculoskeletal: He exhibits no edema.  Lymphadenopathy:    He has no cervical adenopathy.  Neurological: He is alert and oriented to person, place, and time.  Vitals reviewed.      Recent Results (from the past 2160 hour(s))  COMPLETE METABOLIC PANEL WITH GFR     Status: Abnormal   Collection Time: 07/06/16 12:01 AM  Result Value Ref Range   Sodium 142 135 - 146 mmol/L   Potassium 3.6 3.5 - 5.3 mmol/L   Chloride 107 98 - 110 mmol/L   CO2 21 20 - 31 mmol/L   Glucose, Bld 104 (H) 65 - 99 mg/dL   BUN 18 7 - 25 mg/dL   Creat 1.02 0.70 - 1.18 mg/dL    Comment:   For patients > or = 75 years of age: The upper reference limit for Creatinine is approximately 13% higher for people identified as African-American.      Total Bilirubin 0.5 0.2 - 1.2 mg/dL   Alkaline Phosphatase 111 40 - 115 U/L   AST 17 10 - 35 U/L   ALT 25 9 - 46 U/L   Total Protein 6.3 6.1 - 8.1 g/dL   Albumin 3.7 3.6 - 5.1 g/dL    Calcium 9.1 8.6 - 10.3 mg/dL   GFR, Est African American 83 >=60 mL/min   GFR, Est Non African American 72 >=60 mL/min  CBC with Differential/Platelet     Status: None   Collection Time: 07/06/16 12:01 AM  Result Value Ref Range   WBC 6.4 3.8 - 10.8 K/uL   RBC 4.70 4.20 - 5.80 MIL/uL   Hemoglobin 14.6  13.2 - 17.1 g/dL   HCT 43.8 38.5 - 50.0 %   MCV 93.2 80.0 - 100.0 fL   MCH 31.1 27.0 - 33.0 pg   MCHC 33.3 32.0 - 36.0 g/dL   RDW 14.5 11.0 - 15.0 %   Platelets 191 140 - 400 K/uL   MPV 9.7 7.5 - 12.5 fL   Neutro Abs 4,160 1,500 - 7,800 cells/uL   Lymphs Abs 1,408 850 - 3,900 cells/uL   Monocytes Absolute 576 200 - 950 cells/uL   Eosinophils Absolute 192 15 - 500 cells/uL   Basophils Absolute 64 0 - 200 cells/uL   Neutrophils Relative % 65 %   Lymphocytes Relative 22 %   Monocytes Relative 9 %   Eosinophils Relative 3 %   Basophils Relative 1 %   Smear Review Criteria for review not met   Lipid panel     Status: Abnormal   Collection Time: 07/06/16 12:01 AM  Result Value Ref Range   Cholesterol 145 <200 mg/dL   Triglycerides 227 (H) <150 mg/dL   HDL 37 (L) >40 mg/dL   Total CHOL/HDL Ratio 3.9 <5.0 Ratio   VLDL 45 (H) <30 mg/dL   LDL Cholesterol 63 <100 mg/dL  Hemoglobin A1c     Status: Abnormal   Collection Time: 07/06/16 12:01 AM  Result Value Ref Range   Hgb A1c MFr Bld 5.7 (H) <5.7 %    Comment:   For someone without known diabetes, a hemoglobin A1c value between 5.7% and 6.4% is consistent with prediabetes and should be confirmed with a follow-up test.   For someone with known diabetes, a value <7% indicates that their diabetes is well controlled. A1c targets should be individualized based on duration of diabetes, age, co-morbid conditions and other considerations.   This assay result is consistent with an increased risk of diabetes.   Currently, no consensus exists regarding use of hemoglobin A1c for diagnosis of diabetes in children.      Mean Plasma Glucose  117 mg/dL     Assessment & Plan  Problem List Items Addressed This Visit      Cardiovascular and Mediastinum   Essential hypertension - Primary     Other   Pre-diabetes      No orders of the defined types were placed in this encounter.  1. Essential hypertension Cont HCTZ, Losartan, Metoprolol and Cardizem  2. Pre-diabetes Cont to watch weight.

## 2016-07-18 DIAGNOSIS — R3914 Feeling of incomplete bladder emptying: Secondary | ICD-10-CM | POA: Diagnosis not present

## 2016-07-18 DIAGNOSIS — R351 Nocturia: Secondary | ICD-10-CM | POA: Diagnosis not present

## 2016-07-18 DIAGNOSIS — N401 Enlarged prostate with lower urinary tract symptoms: Secondary | ICD-10-CM | POA: Diagnosis not present

## 2016-07-18 DIAGNOSIS — R3916 Straining to void: Secondary | ICD-10-CM | POA: Diagnosis not present

## 2016-07-18 DIAGNOSIS — N400 Enlarged prostate without lower urinary tract symptoms: Secondary | ICD-10-CM | POA: Diagnosis not present

## 2016-07-18 LAB — PSA: PSA: 1.8

## 2016-10-31 DIAGNOSIS — L570 Actinic keratosis: Secondary | ICD-10-CM | POA: Diagnosis not present

## 2016-10-31 DIAGNOSIS — D485 Neoplasm of uncertain behavior of skin: Secondary | ICD-10-CM | POA: Diagnosis not present

## 2016-10-31 DIAGNOSIS — D0439 Carcinoma in situ of skin of other parts of face: Secondary | ICD-10-CM | POA: Diagnosis not present

## 2016-10-31 DIAGNOSIS — Z85828 Personal history of other malignant neoplasm of skin: Secondary | ICD-10-CM | POA: Diagnosis not present

## 2016-11-07 ENCOUNTER — Ambulatory Visit (INDEPENDENT_AMBULATORY_CARE_PROVIDER_SITE_OTHER): Payer: PPO | Admitting: Family Medicine

## 2016-11-07 ENCOUNTER — Encounter: Payer: Self-pay | Admitting: Family Medicine

## 2016-11-07 VITALS — BP 138/61 | HR 81 | Temp 98.1°F | Ht 68.0 in | Wt 172.8 lb

## 2016-11-07 DIAGNOSIS — I10 Essential (primary) hypertension: Secondary | ICD-10-CM

## 2016-11-07 DIAGNOSIS — N401 Enlarged prostate with lower urinary tract symptoms: Secondary | ICD-10-CM | POA: Diagnosis not present

## 2016-11-07 DIAGNOSIS — N138 Other obstructive and reflux uropathy: Secondary | ICD-10-CM

## 2016-11-07 DIAGNOSIS — Z85828 Personal history of other malignant neoplasm of skin: Secondary | ICD-10-CM | POA: Insufficient documentation

## 2016-11-07 DIAGNOSIS — R7303 Prediabetes: Secondary | ICD-10-CM | POA: Diagnosis not present

## 2016-11-07 DIAGNOSIS — C44321 Squamous cell carcinoma of skin of nose: Secondary | ICD-10-CM | POA: Diagnosis not present

## 2016-11-07 MED ORDER — HYDROCHLOROTHIAZIDE 12.5 MG PO TABS: 12.5000 mg | ORAL_TABLET | Freq: Every day | ORAL | 3 refills | Status: DC

## 2016-11-07 MED ORDER — FISH OIL OMEGA-3 1000 MG PO CAPS: 2000.0000 mg | ORAL_CAPSULE | Freq: Two times a day (BID) | ORAL | Status: DC

## 2016-11-07 NOTE — Patient Instructions (Addendum)
Thank you for coming to the clinic today.  1. A1c 5.7 in past, no check today, will check in 3 months  Keep working on improving diet as discussed  2. - Sent new blood pressure pill - lower dose HCTZ 12.5mg  tablet (WHOLE PILL once daily) - finish your current half pills for now. If cost is too high or coverage, let me know and we can change it, they do make a Capsule form of this.  3. No other med changes  Keep watching BP and bring record in future.  You may schedule an Annual Medicare Wellness Exam once yearly with Tiffany (nurse) here whenever you are ready, may plan on in February March of 2019  Please schedule a Follow-up Appointment to: Return in about 3 months (around 02/07/2017) for Pre-Diabetes A1c, HTN.  If you have any other questions or concerns, please feel free to call the clinic or send a message through [place]. You may also schedule an earlier appointment if necessary.  Additionally, you may be receiving a survey about your experience at our clinic within a few days to 1 week by e-mail or mail. We value your feedback.  Nobie Putnam, DO [place]

## 2016-11-07 NOTE — Assessment & Plan Note (Signed)
Currently with active SCC on nose, confirmed biopsy per Derm Follow-up with [facility] Dr Aubery Lapping

## 2016-11-07 NOTE — Progress Notes (Signed)
Subjective:    Patient ID: Kyle Kung., male    DOB: February 08, 1941, 76 y.o.   MRN: [id number]  Anton Cheramie Brabec [name]. is a 76 y.o. male presenting on 11/07/2016 for Hypertension   HPI   Pre-Diabetes Reports no concerns CBGs: Avg 110-115, Low none, High none. Checks CBGs x 2-3 Meds: Never Currently on ARB Lifestyle: - Diet (Loves to eat "anything", he is working on eating more vegetables especially for snacks, tries to limit salt in take, has decreased sodas to 1-2 weekly, drinks mostly water) - Exercise (walks 30 min daily 5 x weekly, works in garden, yard, active at home) Denies hypoglycemia, polyuria, visual changes, numbness or tingling.  CHRONIC HTN: Reports no new concerns, reviewed home BP checks different times ranging avg 140/60s, often 130s and occasional 150-160s Current Meds - Losartan 168m, HCTZ 12.591m(cuts 2528mabs in half - requesting new dose change today), Metoprolol 37.5mg86mice daily (25mg1ms, 1.5 per dose), Diltiazem-24 hour 240mg 52mrts good compliance, took meds today. Tolerating well, w/o complaints. - Taking ASA 81mg x79meekly, skips weekends.No known CAD or CVA - Followed by Dr Arida CFletcher Anonlogy for PVC in past, controlled on medicines Denies CP, dyspnea, HA, edema, dizziness / lightheadedness  Additionally, takes Fish Oil 1g - 2 pills twice daily 4g daily  PMH - Non-melanoma skin cancer, current SCC on nose and prior BCC on nose >10 year ago. [facility] Benite[facility]ubery Lappingng follow-up excision of current lesion on nose.  Health Maintenance: - Anticipating routine colonoscopy this year, q 5 years ([facility] Dr Robert Gaylyn Cheersal History  Substance Use Topics  . Smoking status: Never Smoker  . Smokeless tobacco: Never Used  . Alcohol use 0.6 oz/week    1 Glasses of wine per week    Review of Systems Per HPI unless specifically indicated above     Objective:    BP 138/61 (BP Location: Right Arm,  Patient Position: Sitting, Cuff Size: Normal)   Pulse 81   Temp 98.1 F (36.7 C) (Oral)   Ht 5' 8"  (1.727 m)   Wt 172 lb 12.8 oz (78.4 kg)   BMI 26.27 kg/m   Wt Readings from Last 3 Encounters:  11/07/16 172 lb 12.8 oz (78.4 kg)  07/07/16 172 lb (78 kg)  06/07/16 173 lb (78.5 kg)    Physical Exam  Constitutional: He is oriented to person, place, and time. He appears well-developed and well-nourished. No distress.  Well-appearing, comfortable, cooperative  HENT:  Head: Normocephalic and atraumatic.  Mouth/Throat: Oropharynx is clear and moist.  Eyes: Conjunctivae are normal. Right eye exhibits no discharge. Left eye exhibits no discharge.  Neck: Normal range of motion. Neck supple. No thyromegaly present.  Cardiovascular: Normal rate, regular rhythm, normal heart sounds and intact distal pulses.   No murmur heard. Pulmonary/Chest: Effort normal and breath sounds normal. No respiratory distress. He has no wheezes. He has no rales.  Musculoskeletal: Normal range of motion. He exhibits no edema.  Lymphadenopathy:    He has no cervical adenopathy.  Neurological: He is alert and oriented to person, place, and time.  Skin: Skin is warm and dry. No rash noted. He is not diaphoretic. No erythema.  1 x 1 cm healing scab from biopsy on anterior nose confirmed SCC.  Psychiatric: He has a normal mood and affect. His behavior is normal.  Well groomed, good eye contact, normal speech and thoughts  Nursing note and vitals reviewed.  Recent Labs  02/04/16 0853 07/06/16 0001  HGBA1C 5.7 5.7*    Results for orders placed or performed in visit on 07/06/16  COMPLETE METABOLIC PANEL WITH GFR  Result Value Ref Range   Sodium 142 135 - 146 mmol/L   Potassium 3.6 3.5 - 5.3 mmol/L   Chloride 107 98 - 110 mmol/L   CO2 21 20 - 31 mmol/L   Glucose, Bld 104 (H) 65 - 99 mg/dL   BUN 18 7 - 25 mg/dL   Creat 1.02 0.70 - 1.18 mg/dL   Total Bilirubin 0.5 0.2 - 1.2 mg/dL   Alkaline Phosphatase 111  40 - 115 U/L   AST 17 10 - 35 U/L   ALT 25 9 - 46 U/L   Total Protein 6.3 6.1 - 8.1 g/dL   Albumin 3.7 3.6 - 5.1 g/dL   Calcium 9.1 8.6 - 10.3 mg/dL   GFR, Est African American 83 >=60 mL/min   GFR, Est Non African American 72 >=60 mL/min  CBC with Differential/Platelet  Result Value Ref Range   WBC 6.4 3.8 - 10.8 K/uL   RBC 4.70 4.20 - 5.80 MIL/uL   Hemoglobin 14.6 13.2 - 17.1 g/dL   HCT 43.8 38.5 - 50.0 %   MCV 93.2 80.0 - 100.0 fL   MCH 31.1 27.0 - 33.0 pg   MCHC 33.3 32.0 - 36.0 g/dL   RDW 14.5 11.0 - 15.0 %   Platelets 191 140 - 400 K/uL   MPV 9.7 7.5 - 12.5 fL   Neutro Abs 4,160 1,500 - 7,800 cells/uL   Lymphs Abs 1,408 850 - 3,900 cells/uL   Monocytes Absolute 576 200 - 950 cells/uL   Eosinophils Absolute 192 15 - 500 cells/uL   Basophils Absolute 64 0 - 200 cells/uL   Neutrophils Relative % 65 %   Lymphocytes Relative 22 %   Monocytes Relative 9 %   Eosinophils Relative 3 %   Basophils Relative 1 %   Smear Review Criteria for review not met   Lipid panel  Result Value Ref Range   Cholesterol 145 <200 mg/dL   Triglycerides 227 (H) <150 mg/dL   HDL 37 (L) >40 mg/dL   Total CHOL/HDL Ratio 3.9 <5.0 Ratio   VLDL 45 (H) <30 mg/dL   LDL Cholesterol 63 <100 mg/dL  Hemoglobin A1c  Result Value Ref Range   Hgb A1c MFr Bld 5.7 (H) <5.7 %   Mean Plasma Glucose 117 mg/dL      Assessment & Plan:   Problem List Items Addressed This Visit    Squamous cell cancer of skin of nose    Currently with active SCC on nose, confirmed biopsy per Derm Follow-up with [facility] Dr Aubery Lapping      Pre-diabetes - Primary    Well-controlled Pre-DM - prior A1c 5.3, no A1c today Concern with HTN  Plan:  1. Not on any therapy currently 2. Encourage improved lifestyle - low carb, low sugar diet, reduce portion size, continue improving regular exercise 3. Follow-up 3 months Pre-DM A1c, continue check q 6-12 months for now      History of nonmelanoma skin  cancer    Currently with active SCC on nose, confirmed biopsy per Derm Follow-up with [facility] Dr Aubery Lapping      Essential hypertension    Controlled HTN - Home BP readings with some mild elevated readings but avg closer to SBP 213  No known complications except history of PVCs   Plan:  1. Continue current BP regimen - Losartan 149m, HCTZ 12.567m(CHANGED pill to 12.64m82mab instead of half of 264m50mb), Metoprolol 37.64mg 21m (1.5 tabs of 264mg)33mEncourage improved lifestyle - low sodium diet, may start more regular exercise 3. Continue monitor BP outside office, bring readings to next visit, if persistently >140/90 or new symptoms notify office sooner 4. Follow-up 3 months - consider increase HCTZ up to 264mg d264m if needed additional control      Relevant Medications   hydrochlorothiazide (HYDRODIURIL) 12.5 MG tablet   BPH with obstruction/lower urinary tract symptoms      Meds ordered this encounter  Medications  . hydrochlorothiazide (HYDRODIURIL) 12.5 MG tablet    Sig: Take 1 tablet (12.5 mg total) by mouth daily.    Dispense:  90 tablet    Refill:  3  . Omega-3 Fatty Acids (FISH OIL OMEGA-3) 1000 MG CAPS    Sig: Take 2,000 mg by mouth 2 (two) times daily.    Dispense:  90 capsule    Follow up plan: Return in about 3 months (around 02/07/2017) for Pre-Diabetes A1c, HTN.  AlexandNobie [facility] 11/07/2016, 1:52 PM

## 2016-11-07 NOTE — Assessment & Plan Note (Signed)
Well-controlled Pre-DM - prior A1c 5.3, no A1c today Concern with HTN  Plan:  1. Not on any therapy currently 2. Encourage improved lifestyle - low carb, low sugar diet, reduce portion size, continue improving regular exercise 3. Follow-up 3 months Pre-DM A1c, continue check q 6-12 months for now

## 2016-11-07 NOTE — Assessment & Plan Note (Signed)
Currently with active SCC on nose, confirmed biopsy per Derm Follow-up with [facility]ermat[facility] Dr Aubery Lapping

## 2016-11-07 NOTE — Assessment & Plan Note (Signed)
Controlled HTN - Home BP readings with some mild elevated readings but avg closer to SBP 607  No known complications except history of PVCs   Plan:  1. Continue current BP regimen - Losartan 100mg , HCTZ 12.5mg  (CHANGED pill to 12.5mg  tab instead of half of 25mg  tab), Metoprolol 37.5mg  BID (1.5 tabs of 25mg ) 2. Encourage improved lifestyle - low sodium diet, may start more regular exercise 3. Continue monitor BP outside office, bring readings to next visit, if persistently >140/90 or new symptoms notify office sooner 4. Follow-up 3 months - consider increase HCTZ up to 25mg  daily if needed additional control

## 2016-11-11 DIAGNOSIS — Z86010 Personal history of colonic polyps: Secondary | ICD-10-CM | POA: Diagnosis not present

## 2016-11-29 DIAGNOSIS — S61211A Laceration without foreign body of left index finger without damage to nail, initial encounter: Secondary | ICD-10-CM | POA: Diagnosis not present

## 2016-11-29 DIAGNOSIS — Z79899 Other long term (current) drug therapy: Secondary | ICD-10-CM | POA: Diagnosis not present

## 2016-11-29 DIAGNOSIS — Z7982 Long term (current) use of aspirin: Secondary | ICD-10-CM | POA: Diagnosis not present

## 2016-11-29 DIAGNOSIS — I10 Essential (primary) hypertension: Secondary | ICD-10-CM | POA: Diagnosis not present

## 2016-12-07 DIAGNOSIS — D0439 Carcinoma in situ of skin of other parts of face: Secondary | ICD-10-CM | POA: Diagnosis not present

## 2016-12-07 DIAGNOSIS — C44321 Squamous cell carcinoma of skin of nose: Secondary | ICD-10-CM | POA: Diagnosis not present

## 2016-12-07 DIAGNOSIS — C44311 Basal cell carcinoma of skin of nose: Secondary | ICD-10-CM | POA: Diagnosis not present

## 2016-12-07 DIAGNOSIS — L570 Actinic keratosis: Secondary | ICD-10-CM | POA: Diagnosis not present

## 2016-12-12 ENCOUNTER — Other Ambulatory Visit: Payer: Self-pay | Admitting: Cardiovascular Disease

## 2016-12-13 ENCOUNTER — Ambulatory Visit (INDEPENDENT_AMBULATORY_CARE_PROVIDER_SITE_OTHER): Payer: PPO

## 2016-12-13 VITALS — BP 122/72 | HR 66 | Temp 97.7°F | Resp 16 | Ht 68.0 in | Wt 172.6 lb

## 2016-12-13 DIAGNOSIS — Z0000 Encounter for general adult medical examination without abnormal findings: Secondary | ICD-10-CM | POA: Diagnosis not present

## 2016-12-13 NOTE — Patient Instructions (Addendum)
Kyle Vargas , Thank you for taking time to come for your Medicare Wellness Visit. I appreciate your ongoing commitment to your health goals. Please review the following plan we discussed and let me know if I can assist you in the future.   Screening recommendations/referrals: Colonoscopy: Completed 11/16/2013 Recommended yearly ophthalmology/optometry visit for glaucoma screening and checkup Recommended yearly dental visit for hygiene and checkup  Vaccinations: Influenza vaccine: up to date, due 01/2017 Pneumococcal vaccine: up to date Tdap vaccine: up to date Shingles vaccine: up to date  Advanced directives: Please bring a copy of your health care power of attorney and living will to the office at your convenience.  Conditions/risks identified: Recommend drinking at least 3-4 glasses of water a day   Next appointment: Follow up on 02/15/2017 at 8:00am with [facility]. Follow up in one year for your annual wellness exam.   Preventive Care 65 Years and Older, Male Preventive care refers to lifestyle choices and visits with your health care provider that can promote health and wellness. What does preventive care include?  A yearly physical exam. This is also called an annual well check.  Dental exams once or twice a year.  Routine eye exams. Ask your health care provider how often you should have your eyes checked.  Personal lifestyle choices, including:  Daily care of your teeth and gums.  Regular physical activity.  Eating a healthy diet.  Avoiding tobacco and drug use.  Limiting alcohol use.  Practicing safe sex.  Taking low doses of aspirin every day.  Taking vitamin and mineral supplements as recommended by your health care provider. What happens during an annual well check? The services and screenings done by your health care provider during your annual well check will depend on your age, overall health, lifestyle risk factors, and family history of  disease. Counseling  Your health care provider may ask you questions about your:  Alcohol use.  Tobacco use.  Drug use.  Emotional well-being.  Home and relationship well-being.  Sexual activity.  Eating habits.  History of falls.  Memory and ability to understand (cognition).  Work and work Statistician. Screening  You may have the following tests or measurements:  Height, weight, and BMI.  Blood pressure.  Lipid and cholesterol levels. These may be checked every 5 years, or more frequently if you are over 47 years old.  Skin check.  Lung cancer screening. You may have this screening every year starting at age 80 if you have a 30-pack-year history of smoking and currently smoke or have quit within the past 15 years.  Fecal occult blood test (FOBT) of the stool. You may have this test every year starting at age 38.  Flexible sigmoidoscopy or colonoscopy. You may have a sigmoidoscopy every 5 years or a colonoscopy every 10 years starting at age 79.  Prostate cancer screening. Recommendations will vary depending on your family history and other risks.  Hepatitis C blood test.  Hepatitis B blood test.  Sexually transmitted disease (STD) testing.  Diabetes screening. This is done by checking your blood sugar (glucose) after you have not eaten for a while (fasting). You may have this done every 1-3 years.  Abdominal aortic aneurysm (AAA) screening. You may need this if you are a current or former smoker.  Osteoporosis. You may be screened starting at age 11 if you are at high risk. Talk with your health care provider about your test results, treatment options, and if necessary, the need for more tests.  Vaccines  Your health care provider may recommend certain vaccines, such as:  Influenza vaccine. This is recommended every year.  Tetanus, diphtheria, and acellular pertussis (Tdap, Td) vaccine. You may need a Td booster every 10 years.  Zoster vaccine. You may  need this after age 55.  Pneumococcal 13-valent conjugate (PCV13) vaccine. One dose is recommended after age 64.  Pneumococcal polysaccharide (PPSV23) vaccine. One dose is recommended after age 43. Talk to your health care provider about which screenings and vaccines you need and how often you need them. This information is not intended to replace advice given to you by your health care provider. Make sure you discuss any questions you have with your health care provider. Document Released: 05/22/2015 Document Revised: 01/13/2016 Document Reviewed: 02/24/2015 Elsevier Interactive Patient Education  2017 [place] Prevention in the Home Falls can cause injuries. They can happen to people of all ages. There are many things you can do to make your home safe and to help prevent falls. What can I do on the outside of my home?  Regularly fix the edges of walkways and driveways and fix any cracks.  Remove anything that might make you trip as you walk through a door, such as a raised step or threshold.  Trim any bushes or trees on the path to your home.  Use bright outdoor lighting.  Clear any walking paths of anything that might make someone trip, such as rocks or tools.  Regularly check to see if handrails are loose or broken. Make sure that both sides of any steps have handrails.  Any raised decks and porches should have guardrails on the edges.  Have any leaves, snow, or ice cleared regularly.  Use sand or salt on walking paths during winter.  Clean up any spills in your garage right away. This includes oil or grease spills. What can I do in the bathroom?  Use night lights.  Install grab bars by the toilet and in the tub and shower. Do not use towel bars as grab bars.  Use non-skid mats or decals in the tub or shower.  If you need to sit down in the shower, use a plastic, non-slip stool.  Keep the floor dry. Clean up any water that spills on the floor as soon as it  happens.  Remove soap buildup in the tub or shower regularly.  Attach bath mats securely with double-sided non-slip rug tape.  Do not have throw rugs and other things on the floor that can make you trip. What can I do in the bedroom?  Use night lights.  Make sure that you have a light by your bed that is easy to reach.  Do not use any sheets or blankets that are too big for your bed. They should not hang down onto the floor.  Have a firm chair that has side arms. You can use this for support while you get dressed.  Do not have throw rugs and other things on the floor that can make you trip. What can I do in the kitchen?  Clean up any spills right away.  Avoid walking on wet floors.  Keep items that you use a lot in easy-to-reach places.  If you need to reach something above you, use a strong step stool that has a grab bar.  Keep electrical cords out of the way.  Do not use floor polish or wax that makes floors slippery. If you must use wax, use non-skid floor wax.  Do not have throw rugs and other things on the floor that can make you trip. What can I do with my stairs?  Do not leave any items on the stairs.  Make sure that there are handrails on both sides of the stairs and use them. Fix handrails that are broken or loose. Make sure that handrails are as long as the stairways.  Check any carpeting to make sure that it is firmly attached to the stairs. Fix any carpet that is loose or worn.  Avoid having throw rugs at the top or bottom of the stairs. If you do have throw rugs, attach them to the floor with carpet tape.  Make sure that you have a light switch at the top of the stairs and the bottom of the stairs. If you do not have them, ask someone to add them for you. What else can I do to help prevent falls?  Wear shoes that:  Do not have high heels.  Have rubber bottoms.  Are comfortable and fit you well.  Are closed at the toe. Do not wear sandals.  If you  use a stepladder:  Make sure that it is fully opened. Do not climb a closed stepladder.  Make sure that both sides of the stepladder are locked into place.  Ask someone to hold it for you, if possible.  Clearly mark and make sure that you can see:  Any grab bars or handrails.  First and last steps.  Where the edge of each step is.  Use tools that help you move around (mobility aids) if they are needed. These include:  Canes.  Walkers.  Scooters.  Crutches.  Turn on the lights when you go into a dark area. Replace any light bulbs as soon as they burn out.  Set up your furniture so you have a clear path. Avoid moving your furniture around.  If any of your floors are uneven, fix them.  If there are any pets around you, be aware of where they are.  Review your medicines with your doctor. Some medicines can make you feel dizzy. This can increase your chance of falling. Ask your doctor what other things that you can do to help prevent falls. This information is not intended to replace advice given to you by your health care provider. Make sure you discuss any questions you have with your health care provider. Document Released: 02/19/2009 Document Revised: 10/01/2015 Document Reviewed: 05/30/2014 Elsevier Interactive Patient Education  2017 [employer].

## 2016-12-13 NOTE — Progress Notes (Signed)
Subjective:   Kyle Vargas. is a 76 y.o. male who presents for Medicare Annual/Subsequent preventive examination.  Review of Systems:   Cardiac Risk Factors include: male gender;advanced age (>70men, >20 women);hypertension     Objective:    Vitals: BP 122/72 (BP Location: Left Arm, Patient Position: Sitting)   Pulse 66   Temp 97.7 F (36.5 C)   Resp 16   Ht 5\' 8"  (1.727 m)   Wt 172 lb 9.6 oz (78.3 kg)   BMI 26.24 kg/m   Body mass index is 26.24 kg/m.  Tobacco History  Smoking Status  . Never Smoker  Smokeless Tobacco  . Never Used     Counseling given: Not Answered   Past Medical History:  Diagnosis Date  . Allergic rhinitis, cause unspecified   . Diarrhea   . Esophageal reflux   . H/O diverticulitis of colon   . History of kidney stones   . Other and unspecified hyperlipidemia   . PVC's (premature ventricular contractions)    Right ventricular outflow tract   Past Surgical History:  Procedure Laterality Date  . BASAL CELL CARCINOMA EXCISION    . COLONOSCOPY    . REPAIR IMPERFORATE ANUS / ANORECTOPLASTY    . TRANSURETHRAL RESECTION OF PROSTATE     Family History  Problem Relation Age of Onset  . Hypertension Father    History  Sexual Activity  . Sexual activity: Not on file    Outpatient Encounter Prescriptions as of 12/13/2016  Medication Sig  . azelastine (ASTELIN) 0.1 % nasal spray Place 2 sprays into both nostrils 2 (two) times daily. Use in each nostril as directed  . cephALEXin (KEFLEX) 500 MG capsule Take 500 mg by mouth 3 (three) times daily.  Marland Kitchen diltiazem (CARDIZEM CD) 240 MG 24 hr capsule Take 1 capsule (240 mg total) by mouth every morning.  . fluticasone (FLONASE) 50 MCG/ACT nasal spray Place 2 sprays into the nose once.  Marland Kitchen glucose blood test strip ONETOUCH ULTRA BLUE (In Vitro Strip)  1 (one) Strip Strip check blood sugar once daily for 0 days  Quantity: 50;  Refills: 12   Ordered :02-Oct-2013  [name];  Started 03-Jul-2013  Active  . hydrochlorothiazide (HYDRODIURIL) 12.5 MG tablet Take 1 tablet (12.5 mg total) by mouth daily.  Marland Kitchen losartan (COZAAR) 100 MG tablet Take 1 tablet (100 mg total) by mouth daily.  . metoprolol tartrate (LOPRESSOR) 25 MG tablet Take 1.5 tabs twice daily  . NON FORMULARY Take 1 tablet by mouth daily. Cholestoff 2 tablets daily.  . Omega-3 Fatty Acids (FISH OIL OMEGA-3) 1000 MG CAPS Take 2,000 mg by mouth 2 (two) times daily.  . Sodium Chloride-Sodium Bicarb (AYR SALINE NASAL NETI RINSE) 1.57 G PACK Place into the nose as needed.   Marland Kitchen aspirin 81 MG tablet Take 81 mg by mouth daily.  . [DISCONTINUED] imiquimod (ALDARA) 5 % cream Apply topically at bedtime.    No facility-administered encounter medications on file as of 12/13/2016.     Activities of Daily Living In your present state of health, do you have any difficulty performing the following activities: 12/13/2016 06/07/2016  Hearing? N N  Vision? N N  Difficulty concentrating or making decisions? N N  Walking or climbing stairs? N N  Dressing or bathing? N N  Doing errands, shopping? N N  Preparing Food and eating ? N -  Using the Toilet? N -  In the past six months, have you accidently leaked urine? N -  Do you have problems with loss of bowel control? N -  Managing your Medications? N -  Managing your Finances? N -  Housekeeping or managing your Housekeeping? N -  Some recent data might be hidden    Patient Care Team: [name], DO as PCP - General (Family Medicine) Jannet Mantis, MD (Dermatology) Royston Cowper, MD as Consulting Physician (Urology) Wellington Hampshire, MD as Consulting Physician (Cardiology)   Assessment:     Exercise Activities and Dietary recommendations Current Exercise Habits: Home exercise routine, Type of exercise: walking, Time (Minutes): 30, Frequency (Times/Week): 5, Weekly Exercise (Minutes/Week): 150, Intensity: Mild, Exercise limited by: None identified  Goals    .  Exercise 3x per week (30 min per time)          Patient wants to continue walking daily to stay healthy. He currently walks 30 mins five days a week.    . Increase water intake          Recommend drinking at least 3-4 glasses of water a day       Fall Risk Fall Risk  12/13/2016 06/07/2016 04/25/2016 10/26/2015 01/23/2015  Falls in the past year? No No No No No   Depression Screen PHQ 2/9 Scores 12/13/2016 06/07/2016 04/25/2016 10/26/2015  PHQ - 2 Score 0 0 0 0    Cognitive Function MMSE - Mini Mental State Exam 01/23/2015  Orientation to time 5  Orientation to Place 5  Registration 3  Attention/ Calculation 5  Recall 2  Language- name 2 objects 2  Language- repeat 1  Language- follow 3 step command 3  Language- read & follow direction 1  Write a sentence 1  Copy design 1  Total score 29     6CIT Screen 12/13/2016  What Year? 0 points  What month? 0 points  What time? 0 points  Count back from 20 0 points  Months in reverse 0 points  Repeat phrase 0 points  Total Score 0    Immunization History  Administered Date(s) Administered  . Influenza, High Dose Seasonal PF 02/11/2014, 01/23/2015, 02/04/2016  . Influenza-Unspecified 03/09/2012, 03/14/2014  . Pneumococcal Conjugate-13 01/21/2014  . Pneumococcal Polysaccharide-23 03/09/2008  . Tdap 01/21/2014, 11/29/2016  . Zoster 01/23/2015   Screening Tests Health Maintenance  Topic Date Due  . INFLUENZA VACCINE  12/07/2016  . COLONOSCOPY  11/17/2023  . TETANUS/TDAP  01/22/2024  . PNA vac Low Risk Adult  Completed      Plan:    I have personally reviewed and addressed the Medicare Annual Wellness questionnaire and have noted the following in the patient's chart:  A. Medical and social history B. Use of alcohol, tobacco or illicit drugs  C. Current medications and supplements D. Functional ability and status E.  Nutritional status F.  Physical activity G. Advance directives H. List of other physicians I.    Hospitalizations, surgeries, and ER visits in previous 12 months J.  [place] such as hearing and vision if needed, cognitive and depression L. Referrals and appointments   In addition, I have reviewed and discussed with patient certain preventive protocols, quality metrics, and best practice recommendations. A written personalized care plan for preventive services as well as general preventive health recommendations were provided to patient.   Signed,  Tyler Aas, LPN Nurse Health Advisor   MD Recommendations: none

## 2016-12-14 DIAGNOSIS — H2513 Age-related nuclear cataract, bilateral: Secondary | ICD-10-CM | POA: Diagnosis not present

## 2017-02-08 ENCOUNTER — Ambulatory Visit
Admission: RE | Admit: 2017-02-08 | Discharge: 2017-02-08 | Disposition: A | Discharge status: Home / Self Care | Payer: PPO | Source: Ambulatory Visit | Attending: Unknown Physician Specialty | Admitting: Unknown Physician Specialty

## 2017-02-08 ENCOUNTER — Encounter
Admission: RE | Disposition: A | Discharge status: Home / Self Care | Payer: Self-pay | Source: Ambulatory Visit | Attending: Unknown Physician Specialty

## 2017-02-08 ENCOUNTER — Ambulatory Visit: Payer: PPO | Admitting: Anesthesiology

## 2017-02-08 ENCOUNTER — Encounter: Payer: Self-pay | Admitting: *Deleted

## 2017-02-08 DIAGNOSIS — K648 Other hemorrhoids: Secondary | ICD-10-CM | POA: Diagnosis not present

## 2017-02-08 DIAGNOSIS — K640 First degree hemorrhoids: Secondary | ICD-10-CM | POA: Diagnosis not present

## 2017-02-08 DIAGNOSIS — K621 Rectal polyp: Secondary | ICD-10-CM | POA: Diagnosis not present

## 2017-02-08 DIAGNOSIS — E785 Hyperlipidemia, unspecified: Secondary | ICD-10-CM | POA: Diagnosis not present

## 2017-02-08 DIAGNOSIS — K219 Gastro-esophageal reflux disease without esophagitis: Secondary | ICD-10-CM | POA: Insufficient documentation

## 2017-02-08 DIAGNOSIS — Z79899 Other long term (current) drug therapy: Secondary | ICD-10-CM | POA: Insufficient documentation

## 2017-02-08 DIAGNOSIS — Z85828 Personal history of other malignant neoplasm of skin: Secondary | ICD-10-CM | POA: Diagnosis not present

## 2017-02-08 DIAGNOSIS — Z800 Family history of malignant neoplasm of digestive organs: Secondary | ICD-10-CM | POA: Diagnosis not present

## 2017-02-08 DIAGNOSIS — Z86010 Personal history of colonic polyps: Secondary | ICD-10-CM | POA: Diagnosis not present

## 2017-02-08 DIAGNOSIS — I10 Essential (primary) hypertension: Secondary | ICD-10-CM | POA: Diagnosis not present

## 2017-02-08 DIAGNOSIS — K635 Polyp of colon: Secondary | ICD-10-CM | POA: Diagnosis not present

## 2017-02-08 DIAGNOSIS — D123 Benign neoplasm of transverse colon: Secondary | ICD-10-CM | POA: Diagnosis not present

## 2017-02-08 DIAGNOSIS — Z1211 Encounter for screening for malignant neoplasm of colon: Secondary | ICD-10-CM | POA: Diagnosis not present

## 2017-02-08 DIAGNOSIS — Z7982 Long term (current) use of aspirin: Secondary | ICD-10-CM | POA: Insufficient documentation

## 2017-02-08 DIAGNOSIS — K5730 Diverticulosis of large intestine without perforation or abscess without bleeding: Secondary | ICD-10-CM | POA: Diagnosis not present

## 2017-02-08 DIAGNOSIS — Z980 Intestinal bypass and anastomosis status: Secondary | ICD-10-CM | POA: Diagnosis not present

## 2017-02-08 DIAGNOSIS — K5790 Diverticulosis of intestine, part unspecified, without perforation or abscess without bleeding: Secondary | ICD-10-CM | POA: Diagnosis not present

## 2017-02-08 DIAGNOSIS — D128 Benign neoplasm of rectum: Secondary | ICD-10-CM | POA: Diagnosis not present

## 2017-02-08 HISTORY — DX: Cardiac arrhythmia, unspecified: I49.9

## 2017-02-08 HISTORY — PX: COLONOSCOPY WITH PROPOFOL: SHX5780

## 2017-02-08 LAB — GLUCOSE, CAPILLARY: Glucose-Capillary: 105 mg/dL — ABNORMAL HIGH (ref 65–99)

## 2017-02-08 SURGERY — COLONOSCOPY WITH PROPOFOL
Anesthesia: General

## 2017-02-08 MED ORDER — SODIUM CHLORIDE 0.9 % IV SOLN
INTRAVENOUS | Status: DC
  Administered 2017-02-08: 10:00:00 via INTRAVENOUS

## 2017-02-08 MED ORDER — FENTANYL CITRATE (PF) 100 MCG/2ML IJ SOLN
INTRAMUSCULAR | Status: AC
  Filled 2017-02-08: qty 2

## 2017-02-08 MED ORDER — PROPOFOL 500 MG/50ML IV EMUL
INTRAVENOUS | Status: DC | PRN
  Administered 2017-02-08: 120 ug/kg/min via INTRAVENOUS

## 2017-02-08 MED ORDER — SODIUM CHLORIDE 0.9 % IV SOLN: INTRAVENOUS | Status: DC

## 2017-02-08 MED ORDER — FENTANYL CITRATE (PF) 100 MCG/2ML IJ SOLN
INTRAMUSCULAR | Status: DC | PRN
  Administered 2017-02-08 (×2): 50 ug via INTRAVENOUS

## 2017-02-08 MED ORDER — MIDAZOLAM HCL 2 MG/2ML IJ SOLN
INTRAMUSCULAR | Status: AC
  Filled 2017-02-08: qty 2

## 2017-02-08 MED ORDER — MIDAZOLAM HCL 2 MG/2ML IJ SOLN
INTRAMUSCULAR | Status: DC | PRN
  Administered 2017-02-08: 2 mg via INTRAVENOUS

## 2017-02-08 MED ORDER — PROPOFOL 10 MG/ML IV BOLUS
INTRAVENOUS | Status: DC | PRN
  Administered 2017-02-08: 30 mg via INTRAVENOUS

## 2017-02-08 MED ORDER — PROPOFOL 10 MG/ML IV BOLUS
INTRAVENOUS | Status: AC
Start: 2017-02-08 — End: 2017-02-08
  Filled 2017-02-08: qty 20

## 2017-02-08 NOTE — Transfer of Care (Signed)
Immediate Anesthesia Transfer of Care Note  Patient: Kyle Elting Caso Jr.  Procedure(s) Performed: COLONOSCOPY WITH PROPOFOL (N/A )  Patient Location: PACU  Anesthesia Type:General  Level of Consciousness: awake  Airway & Oxygen Therapy: Patient Spontanous Breathing and Patient connected to nasal cannula oxygen  Post-op Assessment: Report given to RN  Post vital signs: Reviewed  Last Vitals:  Vitals:   02/08/17 0916  BP: (!) 147/86  Pulse: 76  Resp: 18  Temp: (!) 35.7 C  SpO2: 99%    Last Pain: There were no vitals filed for this visit.       Complications: No apparent anesthesia complications

## 2017-02-08 NOTE — Anesthesia Post-op Follow-up Note (Signed)
Anesthesia QCDR form completed.        

## 2017-02-08 NOTE — Anesthesia Postprocedure Evaluation (Signed)
Anesthesia Post Note  Patient: Kyle Vargas.  Procedure(s) Performed: COLONOSCOPY WITH PROPOFOL (N/A )  Patient location during evaluation: PACU Anesthesia Type: General Level of consciousness: awake Pain management: pain level controlled Vital Signs Assessment: post-procedure vital signs reviewed and stable Respiratory status: spontaneous breathing Cardiovascular status: stable Anesthetic complications: no     Last Vitals:  Vitals:   02/08/17 0916 02/08/17 1011  BP: (!) 147/86 (!) 109/58  Pulse: 76 62  Resp: 18 16  Temp: (!) 35.7 C (!) 35.8 C  SpO2: 99% 99%    Last Pain:  Vitals:   02/08/17 1011  TempSrc: Tympanic                 VAN STAVEREN,Quinlyn Tep

## 2017-02-08 NOTE — H&P (Signed)
Primary Care Physician:  [name], DO Primary Gastroenterologist:  Dr. Vira Agar  Pre-Procedure History & Physical: HPI:  Kyle Garcon. is a 76 y.o. male is here for an colonoscopy.   Past Medical History:  Diagnosis Date  . Allergic rhinitis, cause unspecified   . Diarrhea   . Dysrhythmia   . Esophageal reflux   . H/O diverticulitis of colon   . History of kidney stones   . Hypertension   . Other and unspecified hyperlipidemia   . PVC's (premature ventricular contractions)    Right ventricular outflow tract    Past Surgical History:  Procedure Laterality Date  . BASAL CELL CARCINOMA EXCISION    . COLONOSCOPY    . REPAIR IMPERFORATE ANUS / ANORECTOPLASTY    . TRANSURETHRAL RESECTION OF PROSTATE      Prior to Admission medications   Medication Sig Start Date End Date Taking? Authorizing Provider  diltiazem (CARDIZEM CD) 240 MG 24 hr capsule Take 1 capsule (240 mg total) by mouth every morning. 12/12/16  Yes Wellington Hampshire, MD  fluticasone (FLONASE) 50 MCG/ACT nasal spray Place 2 sprays into the nose once. 06/04/14  Yes [provider]  hydrochlorothiazide (HYDRODIURIL) 12.5 MG tablet Take 1 tablet (12.5 mg total) by mouth daily. 11/07/16  Yes Karamalegos, Devonne Doughty, DO  losartan (COZAAR) 100 MG tablet Take 1 tablet (100 mg total) by mouth daily. 04/25/16  Yes Arlis Porta., MD  metoprolol tartrate (LOPRESSOR) 25 MG tablet Take 1.5 tabs twice daily 06/07/16  Yes Arlis Porta., MD  Omega-3 Fatty Acids (FISH OIL OMEGA-3) 1000 MG CAPS Take 2,000 mg by mouth 2 (two) times daily. 11/07/16  Yes Karamalegos, Devonne Doughty, DO  aspirin 81 MG tablet Take 81 mg by mouth daily.    [provider]  azelastine (ASTELIN) 0.1 % nasal spray Place 2 sprays into both nostrils 2 (two) times daily. Use in each nostril as directed    [provider]  cephALEXin (KEFLEX) 500 MG capsule Take 500 mg by mouth 3 (three) times daily.    [provider]  glucose blood test strip ONETOUCH ULTRA BLUE (In Vitro Strip)  1 (one) Strip Strip check blood sugar once daily for 0 days  Quantity: 50;  Refills: 12   Ordered :02-Oct-2013  [name];  Started 03-Jul-2013 Active 07/03/13   [provider]  NON FORMULARY Take 1 tablet by mouth daily. Cholestoff 2 tablets daily.    [provider]  Sodium Chloride-Sodium Bicarb (AYR SALINE NASAL NETI RINSE) 1.57 G PACK Place into the nose as needed.     [provider]    Allergies as of 11/17/2016 - Review Complete 11/07/2016  Allergen Reaction Noted  . Dust mite extract  08/09/2014  . Other  08/09/2014    Family History  Problem Relation Age of Onset  . Hypertension Father     Social History   Social History  . Marital status: Married    Spouse name: N/A  . Number of children: N/A  . Years of education: N/A   Occupational History  . Not on file.   Social History Main Topics  . Smoking status: Never Smoker  . Smokeless tobacco: Never Used  . Alcohol use 0.6 oz/week    1 Glasses of wine per week     Comment: occasionally   . Drug use: No  . Sexual activity: Not on file   Other Topics Concern  . Not  on file   Social History Narrative  . No narrative on file    Review of Systems: See HPI, otherwise negative ROS  Physical Exam: BP (!) 147/86   Pulse 76   Temp (!) 96.3 F (35.7 C)   Resp 18   Ht 5\' 8"  (1.727 m)   Wt 78 kg (172 lb)   SpO2 99%   BMI 26.15 kg/m  General:   Alert,  pleasant and cooperative in NAD Head:  Normocephalic and atraumatic. Neck:  Supple; no masses or thyromegaly. Lungs:  Clear throughout to auscultation.    Heart:  Regular rate and rhythm. Abdomen:  Soft, nontender and nondistended. Normal bowel sounds, without guarding, and without rebound.   Neurologic:  Alert and  oriented x4;  grossly normal neurologically.  Impression/Plan: Kyle Kung. is here for an colonoscopy to be performed for [facility]  colon polyps.  Risks, benefits, limitations, and alternatives regarding  colonoscopy have been reviewed with the patient.  Questions have been answered.  All parties agreeable.   Kyle Cheers, MD  02/08/2017, 9:40 AM

## 2017-02-08 NOTE — Op Note (Signed)
[facility] Gastroenterology Patient Name: Kyle Vargas Procedure Date: 02/08/2017 9:29 AM MRN: [id number] Account #: [id number] Date of Birth: January 22, 1941 Admit Type: Outpatient Age: 76 Room: [facility] ENDO ROOM 3 Gender: Male Note Status: Finalized Procedure:            Colonoscopy Indications:          High risk colon cancer surveillance: Personal history                        of colonic polyps Providers:            Manya Silvas, MD Medicines:            Propofol per Anesthesia Complications:        No immediate complications. Procedure:            Pre-Anesthesia Assessment:                       - After reviewing the risks and benefits, the patient                        was deemed in satisfactory condition to undergo the                        procedure.                       After obtaining informed consent, the colonoscope was                        passed under direct vision. Throughout the procedure,                        the patient's blood pressure, pulse, and oxygen                        saturations were monitored continuously. The                        Colonoscope was introduced through the anus and                        advanced to the the ileocolonic anastomosis. The                        colonoscopy was performed without difficulty. The                        patient tolerated the procedure well. The quality of                        the bowel preparation was excellent. Findings:      PROSTATE WAS SIGNIFICANTLY ENLARGED, NO NODULES.      A diminutive polyp was found in the transverse colon. The polyp was       sessile. The polyp was removed with a hot snare. Resection and retrieval       were complete.      A diminutive polyp was found in the splenic flexure. The polyp was       sessile. The polyp was removed with a jumbo cold forceps. Resection and       retrieval were complete.  Two sessile polyps were found in the rectum. The polyps  were diminutive       in size. These polyps were removed with a jumbo cold forceps. Resection       and retrieval were complete.      Internal hemorrhoids were found during endoscopy. The hemorrhoids were       small and Grade I (internal hemorrhoids that do not prolapse).      The exam was otherwise without abnormality. Impression:           - One diminutive polyp in the transverse colon, removed                        with a hot snare. Resected and retrieved.                       - One diminutive polyp at the splenic flexure, removed                        with a jumbo cold forceps. Resected and retrieved.                       - Two diminutive polyps in the rectum, removed with a                        jumbo cold forceps. Resected and retrieved.                       - Internal hemorrhoids.                       - The examination was otherwise normal. Recommendation:       - The findings and recommendations were discussed with                        the patient's family. SEE UR0LOGIST. Manya Silvas, MD 02/08/2017 10:10:15 AM This report has been signed electronically. Number of Addenda: 0 Note Initiated On: 02/08/2017 9:29 AM Scope Withdrawal Time: 0 hours 14 minutes 43 seconds  Total Procedure Duration: 0 hours 18 minutes 0 seconds       [facility]

## 2017-02-08 NOTE — Anesthesia Preprocedure Evaluation (Signed)
Anesthesia Evaluation  Patient identified by MRN, date of birth, ID band Patient awake    Reviewed: Allergy & Precautions, NPO status , Patient's Chart, lab work & pertinent test results  Airway Mallampati: II       Dental  (+) Teeth Intact   Pulmonary neg pulmonary ROS,    breath sounds clear to auscultation       Cardiovascular Exercise Tolerance: Good hypertension, Pt. on medications and Pt. on home beta blockers + dysrhythmias  Rhythm:Regular Rate:Normal     Neuro/Psych negative neurological ROS  negative psych ROS   GI/Hepatic Neg liver ROS, GERD  Medicated,  Endo/Other  negative endocrine ROS  Renal/GU negative Renal ROS     Musculoskeletal negative musculoskeletal ROS (+)   Abdominal Normal abdominal exam  (+)   Peds negative pediatric ROS (+)  Hematology negative hematology ROS (+)   Anesthesia Other Findings   Reproductive/Obstetrics                             Anesthesia Physical Anesthesia Plan  ASA: II  Anesthesia Plan: General   Post-op Pain Management:    Induction: Intravenous  PONV Risk Score and Plan: 0  Airway Management Planned: Natural Airway and Nasal Cannula  Additional Equipment:   Intra-op Plan:   Post-operative Plan:   Informed Consent: I have reviewed the patients History and Physical, chart, labs and discussed the procedure including the risks, benefits and alternatives for the proposed anesthesia with the patient or authorized representative who has indicated his/her understanding and acceptance.     Plan Discussed with: Surgeon  Anesthesia Plan Comments:         Anesthesia Quick Evaluation

## 2017-02-09 ENCOUNTER — Encounter: Payer: Self-pay | Admitting: Unknown Physician Specialty

## 2017-02-09 LAB — SURGICAL PATHOLOGY

## 2017-02-10 ENCOUNTER — Ambulatory Visit (INDEPENDENT_AMBULATORY_CARE_PROVIDER_SITE_OTHER): Payer: PPO | Admitting: Cardiovascular Disease

## 2017-02-10 ENCOUNTER — Encounter: Payer: Self-pay | Admitting: Cardiovascular Disease

## 2017-02-10 VITALS — BP 150/70 | HR 68 | Ht 68.0 in | Wt 168.5 lb

## 2017-02-10 DIAGNOSIS — I493 Ventricular premature depolarization: Secondary | ICD-10-CM

## 2017-02-10 DIAGNOSIS — I10 Essential (primary) hypertension: Secondary | ICD-10-CM

## 2017-02-10 NOTE — Progress Notes (Signed)
Cardiology Office Note   Date:  02/10/2017   ID:  Kyle Kung., DOB 1941-04-24, MRN [id number]  PCP:  [name], DO  Cardiologist:   Kathlyn Sacramento, MD   Chief Complaint  Patient presents with  . other    12 month follow up. Meds reviewed by the pt. verbally. "doing well."       History of Present Illness: Kyle Kehl. is a 76 y.o. male who presents for a followup visit regarding palpitations and PVCs. The patient has prolonged history of palpitations thought to be due to PVCs. Negative stress test in the past.  Echocardiogram in 02/14 showed normal LV systolic function without significant valvular abnormalities. There was mild left ventricular hypertrophy with mild diastolic dysfunction. Holter monitor showed excessive monomorphic PVCs of RVOT origin. He has been treated with diltiazem with excellent control of symptoms. He has been doing very well with no chest pain or shortness of breath. He has brief palpitations. He underwent colonoscopy recently with removal of 4 polyps. He also had skin cancer surgery on his nose. He continues to be very active and enjoys babysitting his grandkids.     Past Medical History:  Diagnosis Date  . Allergic rhinitis, cause unspecified   . Diarrhea   . Dysrhythmia   . Esophageal reflux   . H/O diverticulitis of colon   . History of kidney stones   . Hypertension   . Other and unspecified hyperlipidemia   . PVC's (premature ventricular contractions)    Right ventricular outflow tract    Past Surgical History:  Procedure Laterality Date  . BASAL CELL CARCINOMA EXCISION    . COLONOSCOPY    . COLONOSCOPY WITH PROPOFOL N/A 02/08/2017   Procedure: COLONOSCOPY WITH PROPOFOL;  Surgeon: Manya Silvas, MD;  Location: [facility] ENDOSCOPY;  Service: Endoscopy;  Laterality: N/A;  . REPAIR IMPERFORATE ANUS / ANORECTOPLASTY    . TRANSURETHRAL RESECTION OF PROSTATE       Current Outpatient Prescriptions  Medication Sig  Dispense Refill  . aspirin 81 MG tablet Take 81 mg by mouth daily.    Marland Kitchen azelastine (ASTELIN) 0.1 % nasal spray Place 2 sprays into both nostrils 2 (two) times daily. Use in each nostril as directed    . diltiazem (CARDIZEM CD) 240 MG 24 hr capsule Take 1 capsule (240 mg total) by mouth every morning. 90 capsule 3  . fluticasone (FLONASE) 50 MCG/ACT nasal spray Place 2 sprays into the nose once.    Marland Kitchen glucose blood test strip ONETOUCH ULTRA BLUE (In Vitro Strip)  1 (one) Strip Strip check blood sugar once daily for 0 days  Quantity: 50;  Refills: 12   Ordered :02-Oct-2013  [name];  Started 03-Jul-2013 Active    . hydrochlorothiazide (HYDRODIURIL) 12.5 MG tablet Take 1 tablet (12.5 mg total) by mouth daily. 90 tablet 3  . losartan (COZAAR) 100 MG tablet Take 1 tablet (100 mg total) by mouth daily. 90 tablet 3  . metoprolol tartrate (LOPRESSOR) 25 MG tablet Take 1.5 tabs twice daily 270 tablet 3  . NON FORMULARY Take 1 tablet by mouth daily. Cholestoff 2 tablets daily.    . Omega-3 Fatty Acids (FISH OIL OMEGA-3) 1000 MG CAPS Take 2,000 mg by mouth 2 (two) times daily. 90 capsule   . Sodium Chloride-Sodium Bicarb (AYR SALINE NASAL NETI RINSE) 1.57 G PACK Place into the nose as needed.      No current facility-administered medications for this visit.  Allergies:   Dust mite extract and Other    Social History:  The patient  reports that he has never smoked. He has never used smokeless tobacco. He reports that he drinks about 0.6 oz of alcohol per week . He reports that he does not use drugs.   Family History:  The patient's family history includes Hypertension in his father.    ROS:  Please see the history of present illness.   Otherwise, review of systems are positive for none.   All other systems are reviewed and negative.    PHYSICAL EXAM: VS:  BP (!) 150/70 (BP Location: Left Arm, Patient Position: Sitting, Cuff Size: Normal)   Pulse 68   Ht 5\' 8"  (1.727 m)   Wt 168 lb 8 oz  (76.4 kg)   BMI 25.62 kg/m  , BMI Body mass index is 25.62 kg/m. GEN: Well nourished, well developed, in no acute distress  HEENT: normal  Neck: no JVD, carotid bruits, or masses Cardiac: RRR; no murmurs, rubs, or gallops,no edema  Respiratory:  clear to auscultation bilaterally, normal work of breathing GI: soft, nontender, nondistended, + BS MS: no deformity or atrophy  Skin: warm and dry, no rash Neuro:  Strength and sensation are intact Psych: euthymic mood, full affect   EKG:  EKG is ordered today. The ekg ordered today demonstrates normal sinus rhythm with right bundle branch block.   Recent Labs: 07/06/2016: ALT 25; BUN 18; Creat 1.02; Hemoglobin 14.6; Platelets 191; Potassium 3.6; Sodium 142    Lipid Panel    Component Value Date/Time   CHOL 145 07/06/2016 0001   TRIG 227 (H) 07/06/2016 0001   HDL 37 (L) 07/06/2016 0001   CHOLHDL 3.9 07/06/2016 0001   VLDL 45 (H) 07/06/2016 0001   LDLCALC 63 07/06/2016 0001      Wt Readings from Last 3 Encounters:  02/10/17 168 lb 8 oz (76.4 kg)  02/08/17 172 lb (78 kg)  12/13/16 172 lb 9.6 oz (78.3 kg)       No flowsheet data found.    ASSESSMENT AND PLAN:  1.  Symptomatic PVCs: Well controlled on current dose of diltiazem and metoprolol with no side effects. Normal ejection fraction. Continue same medications.  2. Essential hypertension: Blood pressure is mildly elevated today but usually is more controlled at home. I made no changes today.   Disposition:   FU with me in 1 year  Signed,  Kathlyn Sacramento, MD  02/10/2017 8:20 AM    Kyle Vargas

## 2017-02-10 NOTE — Patient Instructions (Signed)
Medication Instructions: Continue same medications.   Labwork: None.   Procedures/Testing: None.   Follow-Up: 1 year with Dr. Derian Dimalanta.   Any Additional Special Instructions Will Be Listed Below (If Applicable).     If you need a refill on your cardiac medications before your next appointment, please call your pharmacy.   

## 2017-02-15 ENCOUNTER — Ambulatory Visit (INDEPENDENT_AMBULATORY_CARE_PROVIDER_SITE_OTHER): Payer: PPO | Admitting: Family Medicine

## 2017-02-15 ENCOUNTER — Other Ambulatory Visit: Payer: Self-pay | Admitting: Family Medicine

## 2017-02-15 ENCOUNTER — Encounter: Payer: Self-pay | Admitting: Family Medicine

## 2017-02-15 VITALS — BP 138/76 | HR 67 | Temp 98.4°F | Resp 16 | Ht 68.0 in | Wt 169.0 lb

## 2017-02-15 DIAGNOSIS — Z125 Encounter for screening for malignant neoplasm of prostate: Secondary | ICD-10-CM

## 2017-02-15 DIAGNOSIS — H6123 Impacted cerumen, bilateral: Secondary | ICD-10-CM

## 2017-02-15 DIAGNOSIS — S00412A Abrasion of left ear, initial encounter: Secondary | ICD-10-CM | POA: Diagnosis not present

## 2017-02-15 DIAGNOSIS — I10 Essential (primary) hypertension: Secondary | ICD-10-CM | POA: Diagnosis not present

## 2017-02-15 DIAGNOSIS — E785 Hyperlipidemia, unspecified: Secondary | ICD-10-CM | POA: Insufficient documentation

## 2017-02-15 DIAGNOSIS — N401 Enlarged prostate with lower urinary tract symptoms: Secondary | ICD-10-CM

## 2017-02-15 DIAGNOSIS — R7303 Prediabetes: Secondary | ICD-10-CM

## 2017-02-15 DIAGNOSIS — N138 Other obstructive and reflux uropathy: Secondary | ICD-10-CM

## 2017-02-15 DIAGNOSIS — Z23 Encounter for immunization: Secondary | ICD-10-CM | POA: Diagnosis not present

## 2017-02-15 DIAGNOSIS — E782 Mixed hyperlipidemia: Secondary | ICD-10-CM | POA: Diagnosis not present

## 2017-02-15 DIAGNOSIS — E1169 Type 2 diabetes mellitus with other specified complication: Secondary | ICD-10-CM | POA: Insufficient documentation

## 2017-02-15 DIAGNOSIS — Z85828 Personal history of other malignant neoplasm of skin: Secondary | ICD-10-CM

## 2017-02-15 LAB — POCT GLYCOSYLATED HEMOGLOBIN (HGB A1C): Hemoglobin A1C: 5.5 (ref ?–5.7)

## 2017-02-15 MED ORDER — FISH OIL OMEGA-3 1000 MG PO CAPS: 1200.0000 mg | ORAL_CAPSULE | Freq: Three times a day (TID) | ORAL | Status: DC

## 2017-02-15 MED ORDER — LOSARTAN POTASSIUM 100 MG PO TABS: 100.0000 mg | ORAL_TABLET | Freq: Every day | ORAL | 3 refills | Status: DC

## 2017-02-15 MED ORDER — AMOXICILLIN 500 MG PO CAPS: 500.0000 mg | ORAL_CAPSULE | Freq: Two times a day (BID) | ORAL | 0 refills | Status: DC

## 2017-02-15 NOTE — Assessment & Plan Note (Signed)
Controlled HTN. Mild elevated today improved on re-check - Home BP readings seem improved control 130-140, rare elevated No known complications except history of PVCs   Plan:  1. Continue current BP regimen - Losartan 100mg , HCTZ 12.5mg , Metoprolol 37.5mg  BID (1.5 tabs of 25mg ) 2. Encourage improved lifestyle - low sodium diet, continue regular exercise 3. Continue monitor BP outside office, bring readings to next visit, if persistently >140/90 or new symptoms notify office sooner 4. Follow-up 6 months annual phys + labs

## 2017-02-15 NOTE — Patient Instructions (Addendum)
Thank you for coming to the clinic today.  1. A1c 5.5 - keep the great work, last time 5.7  2. BP mildly elevated, will re-check today, keep monitoring the BP outside office.  3. Refilled Losartan, no other med changes today  Flu Shot today  You have thick impacted ear wax (cerumen) blocking ear canals and ear drums. This is the most likely cause of reduced hearing and ear pain and discomfort. - We were able to remove almost all of the ear wax with flushing in the office today  Recommend using the same ear drops at home in the future if ear wax builds up again, over the counter Debrox (Carbamide peroxide), use on both sides following instructions on bottle, pharmacist will direct you to the appropriate ear drops if you need help. May take a week or more.  Avoid using Q-tips inside ears, as this can push wax deeper, but you can try to use rolled up kleenex as a wick to absorb fluid and wax as well.  DUE for FASTING BLOOD WORK (no food or drink after midnight before the lab appointment, only water or coffee without cream/sugar on the morning of)  SCHEDULE "Lab Only" visit in the morning at the clinic for lab draw in 6 MONTHS  - Make sure Lab Only appointment is at about 1 week before your next appointment, so that results will be available  For Lab Results, once available within 2-3 days of blood draw, you can can log in to MyChart online to view your results and a brief explanation. Also, we can discuss results at next follow-up visit.  Please schedule a Follow-up Appointment to: Return in about 6 months (around 08/16/2017) for Medicare Physical.  If you have any other questions or concerns, please feel free to call the clinic or send a message through [place]. You may also schedule an earlier appointment if necessary.  Additionally, you may be receiving a survey about your experience at our clinic within a few days to 1 week by e-mail or mail. We value your feedback.  Nobie Putnam, DO [place]

## 2017-02-15 NOTE — Assessment & Plan Note (Signed)
Stable Followed by [place] u[facility] Dr Maryan Puls Yearly PSA, last 1.8 (07/2016)

## 2017-02-15 NOTE — Assessment & Plan Note (Signed)
Well-controlled Pre-DM A1c 5.5, previous 5.7 Concern with HTN  Plan:  1. Not on any therapy currently 2. Encourage improved lifestyle - low carb, low sugar diet, reduce portion size, continue improving regular exercise 3. Follow-up 6 months labs/A1c + annual then can space out to q 6-12 months in future

## 2017-02-15 NOTE — Progress Notes (Signed)
Subjective:    Patient ID: Kyle Kung., male    DOB: August 12, 1940, 76 y.o.   MRN: [id number]  Kyle Symes [place] [name]. is a 76 y.o. male presenting on 02/15/2017 for Hypertension   HPI   Bilateral Impacted Cerumen, Ears - Reports concern with recent worsening sensation of fullness of Left ear and actually feels both ears have ear wax, has been >1-2 years since last time he had ears cleaned, had similar problem before. - Today denies any ear pain, reduced hearing or hearing loss, drainage, sinus pain, congestion  Pre-Diabetes Reports no concerns CBGs: Avg 110-115, Low none, High none. Checks CBGs x 2-3 weeks Meds: Never Currently on ARB Lifestyle: - Diet (Still improve diet, watch what he eats, inc vegetables, also takes vinegar daily, reduced sodas, inc water) - Exercise (walks 30 min daily 5 x weekly, works in garden, yard, active at home) Denies hypoglycemia, polyuria, visual changes, numbness or tingling.  CHRONIC HTN: BP home readings 120-130/ 50-60, HR 60-70s, with very rare elevated SBP 150-160 only with pain, recent derm procedure Current Meds - Losartan 100mg , HCTZ 12.5mg , Metoprolol 37.5mg  twice daily (25mg  tabs, 1.5 per dose), Diltiazem-24 hour 240mg  Reports good compliance, took meds today. Tolerating well, w/o complaints. - Taking ASA 81mg  x 5 weekly, skips weekends.No known CAD or CVA - Followed by Dr [facility] for PVC in past, controlled on medicines - He takes Fish Oil as well for HyperTG, wants to reduce to 1200 TID instead of 1000 QID Denies CP, dyspnea, HA, edema, dizziness / lightheadedness  Additional history of some difficulty with insomnia and unable to fall back asleep after waking up several times overnight Colonoscopy last week  History of BCC Skin Cancer tip of nose - Followed by Lajuana Ripple for MOHS surgery, last seen 12/2016  Health Maintenance: - Due for Flu Shot, will receive today - UTD on Pneumonia Vaccine, TDap - Colon CA Screening: Last  Colonoscopy 02/08/17 (done by [facility] Dr Vira Agar), results with x 4 polyps resected (x 1 tubular adenoma, x 1 hyperplastic, other unremarkable) and internal hemorrhoids. Currently asymptomatic. No known family history of colon CA.  Depression screen [facility] 2/9 02/15/2017 12/13/2016 06/07/2016  Decreased Interest 0 0 0  Down, Depressed, Hopeless 0 0 0  PHQ - 2 Score 0 0 0    Social History  Substance Use Topics  . Smoking status: Never Smoker  . Smokeless tobacco: Never Used  . Alcohol use 0.6 oz/week    1 Glasses of wine per week     Comment: occasionally     Review of Systems Per HPI unless specifically indicated above     Objective:    BP 138/76 (BP Location: Left Arm, Cuff Size: Normal)   Pulse 67   Temp 98.4 F (36.9 C) (Oral)   Resp 16   Ht 5\' 8"  (1.727 m)   Wt 169 lb (76.7 kg)   BMI 25.70 kg/m   Wt Readings from Last 3 Encounters:  02/15/17 169 lb (76.7 kg)  02/10/17 168 lb 8 oz (76.4 kg)  02/08/17 172 lb (78 kg)    Physical Exam  Constitutional: He is oriented to person, place, and time. He appears well-developed and well-nourished. No distress.  Well-appearing, comfortable, cooperative, pleasant  HENT:  Head: Normocephalic and atraumatic.  Mouth/Throat: Oropharynx is clear and moist.  Frontal / maxillary sinuses non-tender. Nares patent without purulence or edema.  Bilateral TMs impacted with cerumen, see procedure  Oropharynx clear without erythema, exudates, edema  or asymmetry.  Eyes: Conjunctivae are normal. Right eye exhibits no discharge. Left eye exhibits no discharge.  Neck: Normal range of motion. Neck supple. No thyromegaly present.  Cardiovascular: Normal rate, regular rhythm, normal heart sounds and intact distal pulses.   No murmur heard. Pulmonary/Chest: Effort normal and breath sounds normal. No respiratory distress. He has no wheezes. He has no rales.  Musculoskeletal: Normal range of motion. He exhibits no edema.  Lymphadenopathy:    He has no  cervical adenopathy.  Neurological: He is alert and oriented to person, place, and time.  Skin: Skin is warm and dry. No rash noted. He is not diaphoretic. No erythema.  Tip of nose with healing scar from prior skin cancer treatment  Psychiatric: He has a normal mood and affect. His behavior is normal.  Well groomed, good eye contact, normal speech and thoughts  Nursing note and vitals reviewed.    ________________________________________________________ PROCEDURE NOTE Date: 02/15/17 Bilateral Ear Lavage / Cerumen Removal Discussed benefits and risks (including pain / discomforts, dizziness, minor abrasion of ear canal). Verbal consent given by patient. Medication:  carbamide peroxide ear drops, Ear Lavage Solution (warm water + hydrogen peroxide) Performed by Dr Parks Ranger, assisted by Frederich Cha CMA Several drops of carbamide peroxide placed in each ear, allowed to sit for few minutes. Ear lavage solution flushed into one ear at a time in attempt to dislodge and remove ear wax. Additional difficulty with Left ear required use of Bionix Articulating cerumen removal device to scoop out impacted cerumen, some difficulty with long adhered cerumen with resulting minor canal abrasion.  Repeat Ear Exam: - Completely removed cerumen now, with clear ear canals and visible TMs clear and normal appearance. Left side with small abrasion of ear canal with slight bleeding, no injury to TM.  Results for orders placed or performed in visit on 02/15/17  PSA  Result Value Ref Range   PSA 1.8   POCT HgB A1C  Result Value Ref Range   Hemoglobin A1C 5.5 5.7      Assessment & Plan:   Problem List Items Addressed This Visit    BPH with obstruction/lower urinary tract symptoms    Stable Followed by  urology Dr Maryan Puls Yearly PSA, last 1.8 (07/2016)      Essential hypertension    Controlled HTN. Mild elevated today improved on re-check - Home BP readings seem improved control  130-140, rare elevated No known complications except history of PVCs   Plan:  1. Continue current BP regimen - Losartan 100mg , HCTZ 12.5mg , Metoprolol 37.5mg  BID (1.5 tabs of 25mg ) 2. Encourage improved lifestyle - low sodium diet, continue regular exercise 3. Continue monitor BP outside office, bring readings to next visit, if persistently >140/90 or new symptoms notify office sooner 4. Follow-up 6 months annual phys + labs      Relevant Medications   losartan (COZAAR) 100 MG tablet   Hyperlipidemia    Previous elevated TG >200 On Fish Oil, not on statin Agree to switch to Fish Oil 1200 TID instead of 1000 QID Due for fasting lipids 08/2017      Relevant Medications   losartan (COZAAR) 100 MG tablet   Pre-diabetes - Primary    Well-controlled Pre-DM A1c 5.5, previous 5.7 Concern with HTN  Plan:  1. Not on any therapy currently 2. Encourage improved lifestyle - low carb, low sugar diet, reduce portion size, continue improving regular exercise 3. Follow-up 6 months labs/A1c + annual then can space out to q 6-12  months in future      Relevant Orders   POCT HgB A1C (Completed)    Other Visit Diagnoses    Needs flu shot       Relevant Orders   Flu vaccine HIGH DOSE PF (Completed)   Bilateral impacted cerumen       Ear canal abrasion, left, initial encounter      Significant amount of large thick impacted cerumen bilaterally ears, likely present for long time - No complication of hearing loss  Plan: 1. Successful office ear lavage cerumen removal today, re-evaluated with clear ear canals and normal TMs - Complication with L ear minor canal abrasion with minimal bleeding after removing impacted cerumen - Start Amoxicillin 500mg  BID x 7 days for prophylaxis given how deep abrasion is, cannot reach with topical ointment, and agree to avoid ear drops 2. Counseled on avoiding Q-tips and may use Kleenex as wick, use OTC Debrox as needed 3. Follow-up as needed     Relevant  Medications   amoxicillin (AMOXIL) 500 MG capsule      Meds ordered this encounter  Medications  . losartan (COZAAR) 100 MG tablet    Sig: Take 1 tablet (100 mg total) by mouth daily.    Dispense:  90 tablet    Refill:  3  . Omega-3 Fatty Acids (FISH OIL OMEGA-3) 1000 MG CAPS    Sig: Take 1,200 mg by mouth 3 (three) times daily.    Dispense:  90 capsule  . amoxicillin (AMOXIL) 500 MG capsule    Sig: Take 1 capsule (500 mg total) by mouth 2 (two) times daily. For 7 days    Dispense:  14 capsule    Refill:  0   Follow up plan: Return in about 6 months (around 08/16/2017) for Medicare Physical.  Ordered labs for future visit in 08/2017. He will return to see me for physical, and we will decide future follow-up either q 6-12 months at that time. He is due for Medicare AMW in 12/2017.  Nobie Putnam, DO [facility] 02/15/2017, 10:47 AM

## 2017-02-15 NOTE — Assessment & Plan Note (Addendum)
Previous elevated TG >200 On Fish Oil, not on statin Agree to switch to Fish Oil 1200 TID instead of 1000 QID Due for fasting lipids 08/2017

## 2017-03-22 DIAGNOSIS — Z872 Personal history of diseases of the skin and subcutaneous tissue: Secondary | ICD-10-CM | POA: Diagnosis not present

## 2017-03-22 DIAGNOSIS — L570 Actinic keratosis: Secondary | ICD-10-CM | POA: Diagnosis not present

## 2017-03-22 DIAGNOSIS — Z859 Personal history of malignant neoplasm, unspecified: Secondary | ICD-10-CM | POA: Diagnosis not present

## 2017-03-22 DIAGNOSIS — Z85828 Personal history of other malignant neoplasm of skin: Secondary | ICD-10-CM | POA: Diagnosis not present

## 2017-06-16 ENCOUNTER — Ambulatory Visit (INDEPENDENT_AMBULATORY_CARE_PROVIDER_SITE_OTHER): Payer: Medicare HMO | Admitting: Family Medicine

## 2017-06-16 ENCOUNTER — Encounter: Payer: Self-pay | Admitting: Family Medicine

## 2017-06-16 VITALS — BP 159/79 | HR 111 | Temp 99.3°F | Resp 16 | Ht 68.0 in | Wt 174.0 lb

## 2017-06-16 DIAGNOSIS — J0100 Acute maxillary sinusitis, unspecified: Secondary | ICD-10-CM

## 2017-06-16 DIAGNOSIS — R509 Fever, unspecified: Secondary | ICD-10-CM | POA: Diagnosis not present

## 2017-06-16 LAB — POCT INFLUENZA A/B
Influenza A, POC: NEGATIVE
Influenza B, POC: NEGATIVE

## 2017-06-16 MED ORDER — AMOXICILLIN-POT CLAVULANATE 875-125 MG PO TABS: 1.0000 | ORAL_TABLET | Freq: Two times a day (BID) | ORAL | 0 refills | Status: DC

## 2017-06-16 MED ORDER — BENZONATATE 100 MG PO CAPS: 100.0000 mg | ORAL_CAPSULE | Freq: Three times a day (TID) | ORAL | 0 refills | Status: DC | PRN

## 2017-06-16 MED ORDER — IPRATROPIUM BROMIDE 0.06 % NA SOLN: 2.0000 | Freq: Four times a day (QID) | NASAL | 0 refills | Status: DC

## 2017-06-16 NOTE — Patient Instructions (Addendum)
Thank you for coming to the office today.  1.  Your flu test was NEGATIVE, this is not 100% though, and you can still have the flu with a negative test, otherwise it could be a different viral syndrome.  OR as discussed most likely this is a Bacterial Sinusitis infection  - Start Augmentin 1 pill twice daily (breakfast and dinner, with food and plenty of water) for 10 days, complete entire course, do not stop early even if feeling better  - Recommend to keep using Neti pot or Nasal Saline spray multiple times a day to help flush out congestion and clear sinuses - Improve hydration by drinking plenty of clear fluids (water, gatorade) to reduce secretions and thin congestion  If you develop persistent fever >101F for at least 3 consecutive days, headaches with sinus pain or pressure or persistent earache, please schedule a follow-up evaluation within next few days to week.   - Wash hands and cover cough very well to avoid spread of infection - For symptom control:      - Take Ibuprofen / Advil 400-600mg  every 6-8 hours as needed for fever / muscle aches, and may also take Tylenol 500-1000mg  per dose every 6-8 hours or 3 times a day, can alternate dosing      - Start Tessalon perls one every 8 hours or 3 times a day as needed for cough      - Start Atrovent nasal spray decongestant 2 sprays in each nostril up to 4 times daily for 7 days      - Start OTC Mucinex-DM for cough and congestion for up to 7 days - Improve hydration with plenty of clear fluids  If significant worsening with poor fluid intake, worsening fever, difficulty breathing due to coughing, worsening body aches, weakness, or other more concerning symptoms difficulty breathing you can seek treatment at Emergency Department. Also if improved flu symptoms and then worsening days to week later with concerns for bronchitis, productive cough fever chills again we may need to check for possible pneumonia that can occur after the  flu  Please schedule a Follow-up Appointment to: Return in about 2 years (around 06/17/2019), or if symptoms worsen or fail to improve, for Sinusitis.  If you have any other questions or concerns, please feel free to call the office or send a message through [place]. You may also schedule an earlier appointment if necessary.  Additionally, you may be receiving a survey about your experience at our office within a few days to 1 week by e-mail or mail. We value your feedback.  Nobie Putnam, DO [facility]

## 2017-06-16 NOTE — Progress Notes (Signed)
Subjective:    Patient ID: Kyle Vargas., male    DOB: 08-29-1940, 77 y.o.   MRN: [id number]  Avin Gibbons Nygren [name]. is a 77 y.o. male presenting on 06/16/2017 for Fever (sinus pressure, cough, HA onset week getting worst OTC taken)  Patient presents for a same day appointment.  HPI  SINUSITIS / Flu-like symptoms Reports symptoms started 1 week ago with sneezing and sinus congestion and coughing (without productive sputum), gradually worsening throughout week without improvement, now had worsening to fever and chills last night - Sick contacts with two grandsons with intermittent URI symptoms cough cold, have been checked out with negative strep and flu testing - Taking Tylenol / Ibuprofen PRN with mild relief. Occasional benadryl - Using neti pot with some relief from flushing sinuses - No longer using Flonase or Azelastine, ineffective - Admits frontal / maxillary sinus pain and pressure - Admits sore throat and dry throat - Denies abdominal pain, nausea vomiting, diarrhea, body aches muscle aches  Health Maintenance: UTD Flu Shot this season  Depression screen [facility] 2/9 02/15/2017 12/13/2016 06/07/2016  Decreased Interest 0 0 0  Down, Depressed, Hopeless 0 0 0  PHQ - 2 Score 0 0 0    Social History   Tobacco Use  . Smoking status: Never Smoker  . Smokeless tobacco: Never Used  Substance Use Topics  . Alcohol use: Yes    Alcohol/week: 0.6 oz    Types: 1 Glasses of wine per week    Comment: occasionally   . Drug use: No    Review of Systems Per HPI unless specifically indicated above     Objective:    BP (!) 159/79   Pulse (!) 111   Temp 99.3 F (37.4 C) (Oral)   Resp 16   Ht 5\' 8"  (1.727 m)   Wt 174 lb (78.9 kg)   SpO2 98%   BMI 26.46 kg/m   Wt Readings from Last 3 Encounters:  06/16/17 174 lb (78.9 kg)  02/15/17 169 lb (76.7 kg)  02/10/17 168 lb 8 oz (76.4 kg)    Physical Exam  Constitutional: He is oriented to person, place, and time. He appears  well-developed and well-nourished. No distress.  Mildly ill and tired appearing, cooperative  HENT:  Head: Normocephalic and atraumatic.  Frontal / maxillary sinuses mild tender. Nares mild turbinate edema with congestion. Bilateral TMs only partial view due to cerumen, without erythema, effusion or bulging. Oropharynx mild posterior pharyngeal drainage without erythema, exudates, edema or asymmetry.  Eyes: Conjunctivae are normal. Right eye exhibits no discharge. Left eye exhibits no discharge.  Neck: Normal range of motion. Neck supple.  Cardiovascular: Regular rhythm, normal heart sounds and intact distal pulses.  No murmur heard. Tachycardic  Pulmonary/Chest: Effort normal and breath sounds normal. No respiratory distress. He has no wheezes. He has no rales.  Musculoskeletal: Normal range of motion. He exhibits no edema.  Lymphadenopathy:    He has no cervical adenopathy.  Neurological: He is alert and oriented to person, place, and time.  Skin: Skin is warm and dry. No rash noted. He is not diaphoretic. No erythema.  Psychiatric: His behavior is normal.  Nursing note and vitals reviewed.  Results for orders placed or performed in visit on 06/16/17  POCT Influenza A/B  Result Value Ref Range   Influenza A, POC Negative Negative   Influenza B, POC Negative Negative      Assessment & Plan:   Problem List Items Addressed This Visit  None    Visit Diagnoses    Acute non-recurrent maxillary sinusitis    -  Primary   Relevant Medications   amoxicillin-clavulanate (AUGMENTIN) 875-125 MG tablet   ipratropium (ATROVENT) 0.06 % nasal spray   benzonatate (TESSALON) 100 MG capsule   Fever chills       Relevant Orders   POCT Influenza A/B (Completed)      Consistent with acute frontal/maxillary sinusitis, likely initially viral URI component with worsening concern for bacterial infection, duration >1 week now some fevers. No other focal infection ears, throat, lungs clear - Sick  contacts at home, no known flu exposure - S/p flu shot - RAPID FLU NEGATIVE  Plan: 1. Start Augmentin 875-125mg  PO BID x 10 days 2. Start Atrovent nasal spray decongestant 2 sprays in each nostril up to 4 times daily for 7 days - Flonase/Azelastine ineffective - Start Tessalon Perls take 1 capsule up to 3 times a day as needed for cough 3. Supportive care with nasal saline OTC, hydration, neti pot, tylenol NSAID 4. Return criteria reviewed   Meds ordered this encounter  Medications  . amoxicillin-clavulanate (AUGMENTIN) 875-125 MG tablet    Sig: Take 1 tablet by mouth 2 (two) times daily. For 10 days    Dispense:  20 tablet    Refill:  0  . ipratropium (ATROVENT) 0.06 % nasal spray    Sig: Place 2 sprays into both nostrils 4 (four) times daily. For up to 5-7 days then stop.    Dispense:  15 mL    Refill:  0  . benzonatate (TESSALON) 100 MG capsule    Sig: Take 1 capsule (100 mg total) by mouth 3 (three) times daily as needed for cough.    Dispense:  30 capsule    Refill:  0     Follow up plan: Return in about 2 years (around 06/17/2019), or if symptoms worsen or fail to improve, for Sinusitis.  Nobie Putnam, [facility] 06/16/2017, 8:42 AM

## 2017-06-21 ENCOUNTER — Other Ambulatory Visit: Payer: Self-pay

## 2017-06-28 DIAGNOSIS — D0462 Carcinoma in situ of skin of left upper limb, including shoulder: Secondary | ICD-10-CM | POA: Diagnosis not present

## 2017-06-28 DIAGNOSIS — D485 Neoplasm of uncertain behavior of skin: Secondary | ICD-10-CM | POA: Diagnosis not present

## 2017-06-28 DIAGNOSIS — L570 Actinic keratosis: Secondary | ICD-10-CM | POA: Diagnosis not present

## 2017-07-18 ENCOUNTER — Telehealth: Payer: Self-pay

## 2017-07-18 DIAGNOSIS — N401 Enlarged prostate with lower urinary tract symptoms: Secondary | ICD-10-CM | POA: Diagnosis not present

## 2017-07-18 DIAGNOSIS — N400 Enlarged prostate without lower urinary tract symptoms: Secondary | ICD-10-CM | POA: Diagnosis not present

## 2017-07-18 DIAGNOSIS — I493 Ventricular premature depolarization: Secondary | ICD-10-CM

## 2017-07-18 DIAGNOSIS — Z125 Encounter for screening for malignant neoplasm of prostate: Secondary | ICD-10-CM | POA: Diagnosis not present

## 2017-07-18 DIAGNOSIS — I10 Essential (primary) hypertension: Secondary | ICD-10-CM

## 2017-07-18 DIAGNOSIS — R3914 Feeling of incomplete bladder emptying: Secondary | ICD-10-CM | POA: Diagnosis not present

## 2017-07-18 DIAGNOSIS — R3129 Other microscopic hematuria: Secondary | ICD-10-CM | POA: Diagnosis not present

## 2017-07-18 MED ORDER — METOPROLOL TARTRATE 25 MG PO TABS: ORAL_TABLET | ORAL | 3 refills | Status: DC

## 2017-07-18 NOTE — Telephone Encounter (Signed)
Refill on metoprolol tartrate 25mg 

## 2017-07-26 DIAGNOSIS — D0462 Carcinoma in situ of skin of left upper limb, including shoulder: Secondary | ICD-10-CM | POA: Diagnosis not present

## 2017-07-26 DIAGNOSIS — C44629 Squamous cell carcinoma of skin of left upper limb, including shoulder: Secondary | ICD-10-CM | POA: Diagnosis not present

## 2017-08-07 DIAGNOSIS — L0390 Cellulitis, unspecified: Secondary | ICD-10-CM | POA: Diagnosis not present

## 2017-08-15 ENCOUNTER — Other Ambulatory Visit: Payer: Medicare HMO

## 2017-08-15 DIAGNOSIS — I10 Essential (primary) hypertension: Secondary | ICD-10-CM | POA: Diagnosis not present

## 2017-08-15 DIAGNOSIS — Z125 Encounter for screening for malignant neoplasm of prostate: Secondary | ICD-10-CM

## 2017-08-15 DIAGNOSIS — E782 Mixed hyperlipidemia: Secondary | ICD-10-CM

## 2017-08-15 DIAGNOSIS — R7303 Prediabetes: Secondary | ICD-10-CM

## 2017-08-15 DIAGNOSIS — Z85828 Personal history of other malignant neoplasm of skin: Secondary | ICD-10-CM | POA: Diagnosis not present

## 2017-08-16 LAB — COMPLETE METABOLIC PANEL WITH GFR
AG Ratio: 1.6 (calc) (ref 1.0–2.5)
ALBUMIN MSPROF: 4.1 g/dL (ref 3.6–5.1)
ALKALINE PHOSPHATASE (APISO): 121 U/L — AB (ref 40–115)
ALT: 23 U/L (ref 9–46)
AST: 17 U/L (ref 10–35)
BUN: 15 mg/dL (ref 7–25)
CALCIUM: 9.4 mg/dL (ref 8.6–10.3)
CO2: 31 mmol/L (ref 20–32)
CREATININE: 1.01 mg/dL (ref 0.70–1.18)
Chloride: 106 mmol/L (ref 98–110)
GFR, EST NON AFRICAN AMERICAN: 72 mL/min/{1.73_m2} (ref >=60)
GFR, Est African American: 84 mL/min/{1.73_m2} (ref >=60)
GLUCOSE: 100 mg/dL — AB (ref 65–99)
Globulin: 2.5 g/dL (calc) (ref 1.9–3.7)
Potassium: 4.1 mmol/L (ref 3.5–5.3)
Sodium: 143 mmol/L (ref 135–146)
Total Bilirubin: 0.4 mg/dL (ref 0.2–1.2)
Total Protein: 6.6 g/dL (ref 6.1–8.1)

## 2017-08-16 LAB — LIPID PANEL
Cholesterol: 140 mg/dL (ref <200)
HDL: 36 mg/dL — ABNORMAL LOW (ref >40)
LDL CHOLESTEROL (CALC): 61 mg/dL
Non-HDL Cholesterol (Calc): 104 mg/dL (calc) (ref <130)
Total CHOL/HDL Ratio: 3.9 (calc) (ref <5.0)
Triglycerides: 353 mg/dL — ABNORMAL HIGH (ref <150)

## 2017-08-16 LAB — CBC WITH DIFFERENTIAL/PLATELET
BASOS PCT: 0.9 %
Basophils Absolute: 59 cells/uL (ref 0–200)
Eosinophils Absolute: 163 cells/uL (ref 15–500)
Eosinophils Relative: 2.5 %
HCT: 40.8 % (ref 38.5–50.0)
Hemoglobin: 14.4 g/dL (ref 13.2–17.1)
Lymphs Abs: 1495 cells/uL (ref 850–3900)
MCH: 32.2 pg (ref 27.0–33.0)
MCHC: 35.3 g/dL (ref 32.0–36.0)
MCV: 91.3 fL (ref 80.0–100.0)
MONOS PCT: 8.7 %
MPV: 9.9 fL (ref 7.5–12.5)
Neutro Abs: 4219 cells/uL (ref 1500–7800)
Neutrophils Relative %: 64.9 %
PLATELETS: 233 10*3/uL (ref 140–400)
RBC: 4.47 10*6/uL (ref 4.20–5.80)
RDW: 13.6 % (ref 11.0–15.0)
TOTAL LYMPHOCYTE: 23 %
WBC: 6.5 10*3/uL (ref 3.8–10.8)
WBCMIX: 566 {cells}/uL (ref 200–950)

## 2017-08-16 LAB — HEMOGLOBIN A1C
EAG (MMOL/L): 7 (calc)
HEMOGLOBIN A1C: 6 %{Hb} — AB (ref <5.7)
Mean Plasma Glucose: 126 (calc)

## 2017-08-16 LAB — PSA, TOTAL WITH REFLEX TO PSA, FREE: PSA, TOTAL: 1.9 ng/mL (ref <=4.0)

## 2017-08-22 ENCOUNTER — Ambulatory Visit (INDEPENDENT_AMBULATORY_CARE_PROVIDER_SITE_OTHER): Payer: Medicare HMO | Admitting: Family Medicine

## 2017-08-22 ENCOUNTER — Other Ambulatory Visit: Payer: Self-pay

## 2017-08-22 ENCOUNTER — Encounter: Payer: Self-pay | Admitting: Family Medicine

## 2017-08-22 ENCOUNTER — Other Ambulatory Visit: Payer: Self-pay | Admitting: Family Medicine

## 2017-08-22 ENCOUNTER — Ambulatory Visit: Payer: PPO

## 2017-08-22 VITALS — BP 141/68 | HR 64 | Temp 97.9°F | Resp 16 | Ht 67.0 in | Wt 173.0 lb

## 2017-08-22 DIAGNOSIS — E782 Mixed hyperlipidemia: Secondary | ICD-10-CM

## 2017-08-22 DIAGNOSIS — R7303 Prediabetes: Secondary | ICD-10-CM

## 2017-08-22 DIAGNOSIS — Z85828 Personal history of other malignant neoplasm of skin: Secondary | ICD-10-CM | POA: Diagnosis not present

## 2017-08-22 DIAGNOSIS — N401 Enlarged prostate with lower urinary tract symptoms: Secondary | ICD-10-CM | POA: Diagnosis not present

## 2017-08-22 DIAGNOSIS — I10 Essential (primary) hypertension: Secondary | ICD-10-CM | POA: Diagnosis not present

## 2017-08-22 DIAGNOSIS — Z0000 Encounter for general adult medical examination without abnormal findings: Secondary | ICD-10-CM

## 2017-08-22 DIAGNOSIS — N138 Other obstructive and reflux uropathy: Secondary | ICD-10-CM | POA: Diagnosis not present

## 2017-08-22 NOTE — Progress Notes (Signed)
Subjective:    Patient ID: Kyle Kung., male    DOB: [birth year], 77 y.o.   MRN: [id number]  Kyle Vargas. is a 77 y.o. male presenting on 08/22/2017 for Annual Exam; Hypertension; and Pre-Diabetes   HPI   Pre-Diabetes Reports no concerns.  CBGs: Avg 110-120, Low none, High none. Checks CBGs x 2-3 weeks Meds: Never Currently on ARB Lifestyle: - Diet (Not much changed, still improve diet, inc vegetables, also takes vinegar daily, reduced sodas, inc water) - Exercise (walks 30 min daily 5 x weekly, works in garden, yard, active at home) Denies hypoglycemia  CHRONIC HTN: Home BP log improved, 135 avg, 55-60s, HR 70-80 Current Meds - Losartan 100mg , HCTZ 12.5mg , Metoprolol 37.5mg  twice daily (25mg  tabs, 1.5 per dose), Diltiazem-24 hour 240mg  Reports good compliance, took meds today. Tolerating well, w/o complaints. - Taking ASA 81mg  x 5 days weekly, skips weekends.No known CAD or CVA - Followed by Dr [facility] for PVC in past, controlled on medicines  HYPERLIPIDEMIA: - Reports concerns with chronic elevated TG for reportedly >20 years and fam history of same. Last lipid panel 08/2017, mixed result with controlled LDL and slightly low HDL, but elevated TG >350 - Currently taking Fish Oil Omega 3, 1200 TID - he has not been on Statin or Fibrate in past by his report - he has started Garlique supplement OTC  BPH Followed by Dr Maryan Puls [facility]), yearly visit for BPH with prostate check and PSA, and UA, also in past he has history of kidney stones, this time he had small amount of blood on urine sample, and had X-ray showed small kidney stone did not think it was an issue thought it would pass. He is not taking any medicine for BPH. Not complaining of significant change in LUTS.  History of non melanoma skin cancer Followed by Dr Aubery Lapping ([facility] in [place]), SCC treated on Left hand, history of similar spot on nose, treated by Mohs  surgery in [facility] in past for [facility].  Health Maintenance: UTD vaccines, Flu, TDap, Pneumonia series (prevnar-13, pneumovax-23)  UTD Colonoscopy per Dr Gaylyn Cheers [facility] (02/08/17)   Depression screen [facility] 2/9 08/22/2017 02/15/2017 12/13/2016  Decreased Interest 0 0 0  Down, Depressed, Hopeless 0 0 0  PHQ - 2 Score 0 0 0  Altered sleeping 0 - -  Tired, decreased energy 0 - -  Change in appetite 0 - -  Feeling bad or failure about yourself  0 - -  Trouble concentrating 0 - -  Suicidal thoughts 0 - -  PHQ-9 Score 0 - -  Difficult doing work/chores Not difficult at all - -    Past Medical History:  Diagnosis Date  . Allergic rhinitis, cause unspecified   . Diarrhea   . Dysrhythmia   . Esophageal reflux   . H/O diverticulitis of colon   . History of kidney stones   . Hypertension   . Other and unspecified hyperlipidemia   . PVC's (premature ventricular contractions)    Right ventricular outflow tract   Past Surgical History:  Procedure Laterality Date  . BASAL CELL CARCINOMA EXCISION    . COLONOSCOPY    . COLONOSCOPY WITH PROPOFOL N/A 02/08/2017   Procedure: COLONOSCOPY WITH PROPOFOL;  Surgeon: Manya Silvas, MD;  Location: [facility] ENDOSCOPY;  Service: Endoscopy;  Laterality: N/A;  . REPAIR IMPERFORATE ANUS / ANORECTOPLASTY    . TRANSURETHRAL RESECTION OF PROSTATE     Social History  Socioeconomic History  . Marital status: Married    Spouse name: Not on file  . Number of children: Not on file  . Years of education: Not on file  . Highest education level: Not on file  Occupational History  . Not on file  Social Needs  . Financial resource strain: Not on file  . Food insecurity:    Worry: Not on file    Inability: Not on file  . Transportation needs:    Medical: Not on file    Non-medical: Not on file  Tobacco Use  . Smoking status: Never Smoker  . Smokeless tobacco: Never Used  Substance and Sexual Activity  . Alcohol use: Yes    Alcohol/week: 0.6 oz     Types: 1 Glasses of wine per week    Comment: occasionally   . Drug use: No  . Sexual activity: Not on file  Lifestyle  . Physical activity:    Days per week: Not on file    Minutes per session: Not on file  . Stress: Not on file  Relationships  . Social connections:    Talks on phone: Not on file    Gets together: Not on file    Attends religious service: Not on file    Active member of club or organization: Not on file    Attends meetings of clubs or organizations: Not on file    Relationship status: Not on file  . Intimate partner violence:    Fear of current or ex partner: Not on file    Emotionally abused: Not on file    Physically abused: Not on file    Forced sexual activity: Not on file  Other Topics Concern  . Not on file  Social History Narrative  . Not on file   Family History  Problem Relation Age of Onset  . Hypertension Father    Current Outpatient Medications on File Prior to Visit  Medication Sig  . aspirin 81 MG tablet Take 81 mg by mouth daily.  Marland Kitchen diltiazem (CARDIZEM CD) 240 MG 24 hr capsule Take 1 capsule (240 mg total) by mouth every morning.  Marland Kitchen glucose blood test strip ONETOUCH ULTRA BLUE (In Vitro Strip)  1 (one) Strip Strip check blood sugar once daily for 0 days  Quantity: 50;  Refills: 12   Ordered :02-Oct-2013  [name];  Started 03-Jul-2013 Active  . hydrochlorothiazide (HYDRODIURIL) 12.5 MG tablet Take 1 tablet (12.5 mg total) by mouth daily.  Marland Kitchen losartan (COZAAR) 100 MG tablet Take 1 tablet (100 mg total) by mouth daily.  . metoprolol tartrate (LOPRESSOR) 25 MG tablet Take 1.5 tabs twice daily  . NON FORMULARY Take 1 tablet by mouth daily. Cholestoff 2 tablets daily.  . Sodium Chloride-Sodium Bicarb (AYR SALINE NASAL NETI RINSE) 1.57 G PACK Place into the nose as needed.    No current facility-administered medications on file prior to visit.     Review of Systems  Constitutional: Negative for activity change, appetite change, chills,  diaphoresis, fatigue and fever.  HENT: Negative for congestion, hearing loss and sinus pressure.   Eyes: Negative for visual disturbance.  Respiratory: Negative for apnea, cough, choking, chest tightness, shortness of breath and wheezing.   Cardiovascular: Negative for chest pain, palpitations and leg swelling.  Gastrointestinal: Negative for abdominal pain, anal bleeding, blood in stool, constipation, diarrhea, nausea and vomiting.  Endocrine: Negative for cold intolerance.  Genitourinary: Negative for decreased urine volume, difficulty urinating, dysuria, frequency, hematuria, scrotal swelling,  testicular pain and urgency.  Musculoskeletal: Negative for arthralgias, back pain and neck pain.  Skin: Negative for rash.  Allergic/Immunologic: Negative for environmental allergies.  Neurological: Negative for dizziness, weakness, light-headedness, numbness and headaches.  Hematological: Negative for adenopathy.  Psychiatric/Behavioral: Negative for behavioral problems, dysphoric mood and sleep disturbance. The patient is not nervous/anxious.    Per HPI unless specifically indicated above     Objective:    BP (!) 141/68 (BP Location: Left Arm, Cuff Size: Normal)   Pulse 64   Temp 97.9 F (36.6 C)   Resp 16   Ht 5\' 7"  (1.702 m)   Wt 173 lb (78.5 kg)   BMI 27.10 kg/m   Wt Readings from Last 3 Encounters:  08/22/17 173 lb (78.5 kg)  06/16/17 174 lb (78.9 kg)  02/15/17 169 lb (76.7 kg)    Physical Exam  Constitutional: He is oriented to person, place, and time. He appears well-developed and well-nourished. No distress.  Well-appearing, comfortable, cooperative  HENT:  Head: Normocephalic and atraumatic.  Mouth/Throat: Oropharynx is clear and moist.  Frontal / maxillary sinuses non-tender. Nares patent without purulence or edema. Bilateral TMs clear without erythema, effusion or bulging. Oropharynx clear without erythema, exudates, edema or asymmetry.  Eyes: Pupils are equal, round,  and reactive to light. Conjunctivae and EOM are normal. Right eye exhibits no discharge. Left eye exhibits no discharge.  Neck: Normal range of motion. Neck supple. No thyromegaly present.  Cardiovascular: Normal rate, regular rhythm, normal heart sounds and intact distal pulses.  No murmur heard. Pulmonary/Chest: Effort normal and breath sounds normal. No respiratory distress. He has no wheezes. He has no rales.  Abdominal: Soft. Bowel sounds are normal. He exhibits no distension and no mass. There is no tenderness.  Genitourinary:  Genitourinary Comments: Deferred DRE today, done by Urology recently  Musculoskeletal: Normal range of motion. He exhibits no edema or tenderness.  Upper / Lower Extremities: - Normal muscle tone, strength bilateral upper extremities 5/5, lower extremities 5/5  Lymphadenopathy:    He has no cervical adenopathy.  Neurological: He is alert and oriented to person, place, and time.  Distal sensation intact to light touch all extremities  Skin: Skin is warm and dry. No rash noted. He is not diaphoretic. No erythema.  Several areas of sun damaged skin with pigment changes face, arms and upper body.  S/p Mohs surgery nose, well healed. S/p treatment on Left dorsal hand for SCC has some residual redness and irritated skin.  Psychiatric: He has a normal mood and affect. His behavior is normal.  Well groomed, good eye contact, normal speech and thoughts  Nursing note and vitals reviewed.  Results for orders placed or performed in visit on 08/15/17  PSA, Total with Reflex to PSA, Free  Result Value Ref Range   PSA, Total 1.9 < OR = 4.0 ng/mL  CBC with Differential/Platelet  Result Value Ref Range   WBC 6.5 3.8 - 10.8 Thousand/uL   RBC 4.47 4.20 - 5.80 Million/uL   Hemoglobin 14.4 13.2 - 17.1 g/dL   HCT 40.8 38.5 - 50.0 %   MCV 91.3 80.0 - 100.0 fL   MCH 32.2 27.0 - 33.0 pg   MCHC 35.3 32.0 - 36.0 g/dL   RDW 13.6 11.0 - 15.0 %   Platelets 233 140 - 400  Thousand/uL   MPV 9.9 7.5 - 12.5 fL   Neutro Abs 4,219 1,500 - 7,800 cells/uL   Lymphs Abs 1,495 850 - 3,900 cells/uL   WBC  mixed population 566 200 - 950 cells/uL   Eosinophils Absolute 163 15 - 500 cells/uL   Basophils Absolute 59 0 - 200 cells/uL   Neutrophils Relative % 64.9 %   Total Lymphocyte 23.0 %   Monocytes Relative 8.7 %   Eosinophils Relative 2.5 %   Basophils Relative 0.9 %  Lipid panel  Result Value Ref Range   Cholesterol 140 <200 mg/dL   HDL 36 (L) >40 mg/dL   Triglycerides 353 (H) <150 mg/dL   LDL Cholesterol (Calc) 61 mg/dL (calc)   Total CHOL/HDL Ratio 3.9 <5.0 (calc)   Non-HDL Cholesterol (Calc) 104 <130 mg/dL (calc)  Hemoglobin A1c  Result Value Ref Range   Hgb A1c MFr Bld 6.0 (H) <5.7 % of total Hgb   Mean Plasma Glucose 126 (calc)   eAG (mmol/L) 7.0 (calc)  COMPLETE METABOLIC PANEL WITH GFR  Result Value Ref Range   Glucose, Bld 100 (H) 65 - 99 mg/dL   BUN 15 7 - 25 mg/dL   Creat 1.01 0.70 - 1.18 mg/dL   GFR, Est Non African American 72 > OR = 60 mL/min/1.62m2   GFR, Est African American 84 > OR = 60 mL/min/1.53m2   BUN/Creatinine Ratio NOT APPLICABLE 6 - 22 (calc)   Sodium 143 135 - 146 mmol/L   Potassium 4.1 3.5 - 5.3 mmol/L   Chloride 106 98 - 110 mmol/L   CO2 31 20 - 32 mmol/L   Calcium 9.4 8.6 - 10.3 mg/dL   Total Protein 6.6 6.1 - 8.1 g/dL   Albumin 4.1 3.6 - 5.1 g/dL   Globulin 2.5 1.9 - 3.7 g/dL (calc)   AG Ratio 1.6 1.0 - 2.5 (calc)   Total Bilirubin 0.4 0.2 - 1.2 mg/dL   Alkaline phosphatase (APISO) 121 (H) 40 - 115 U/L   AST 17 10 - 35 U/L   ALT 23 9 - 46 U/L      Assessment & Plan:   Problem List Items Addressed This Visit    BPH with obstruction/lower urinary tract symptoms    Stable without change in LUTS Not on therapy rx Followed by [place] u[facility] Dr Maryan Puls Recent PSA trend per Urology reported 2.1, now result from our office was 1.9 (08/2017) - Will follow-up as needed, defer future PSA if he checks at urology        Essential hypertension    Controlled HTN on average. Mild elevated today improved on re-check - Home BP readings seem improved control still No known complications except history of PVCs    Plan:  1. No change today - Continue current BP regimen - Losartan 100mg , HCTZ 12.5mg , Metoprolol 37.5mg  BID (1.5 tabs of 25mg ) 2. Encourage improved lifestyle - low sodium diet, continue regular exercise 3. Continue monitor BP outside office, bring readings to next visit, if persistently >140/90 or new symptoms notify office sooner 4. Follow-up 6 months with labs and f/u - will review BP control, may consider adjust meds can inc either HCTZ or Metoprolol if needed in future      History of nonmelanoma skin cancer    Followed by Dermatology (Dr Phillip Heal, in [facility]) S/p recent treatment Left hand/wrist SCC      Hyperlipidemia    Mostly controlled lipids except HyperTG, chronic issue significant elevated, seems genetic component Last lipid panel 08/2017 - TG >350 Calculated ASCVD 10 yr risk score 28.8%  Plan: 1. Discussion on ASCVD risk - he understands risks and prefers to avoid new meds such as Statin,  we agree emphasis on lifestyle to help improve his cholesterol and reduce risk. - Limited or no improvement on Fish Oil - he may go ahead and STOP taking Omega 3 fish oil for now, and we will re-check cholesterol TG in 6 months, if >300-500 still then strongly consider new rx Fibrate or Statin in future - he may take supplements as needed, Garlique etc 2. Continue ASA 81mg  for primary ASCVD risk reduction 3. Encourage improved lifestyle - low carb/cholesterol, reduce portion size, continue improving regular exercise 4. Follow-up 6 months lipids fasting      Pre-diabetes    Well-controlled Pre-DM A1c up to 6.0, from prior 5.5 to 5.9 Concern with HTN, HLD  Plan:  1. Not on any therapy currently - again defer new med today 2. Encourage improved lifestyle - low carb, low sugar diet, reduce  portion size, continue improving regular exercise 3. Follow-up 6 months labs/A1c       Other Visit Diagnoses    Annual physical exam    -  Primary Up to date on health maintenance Reviewed labs Encourage improve lifestyle diet / exercise with goals as discussed       No orders of the defined types were placed in this encounter.   Follow up plan: Return in about 6 months (around 02/21/2018) for PreDM, HTN, HLD lab follow-up.  Future orders placed for 02/14/18  Nobie Putnam, [facility] 08/22/2017, 12:06 PM

## 2017-08-22 NOTE — Assessment & Plan Note (Signed)
Controlled HTN on average. Mild elevated today improved on re-check - Home BP readings seem improved control still No known complications except history of PVCs    Plan:  1. No change today - Continue current BP regimen - Losartan 100mg , HCTZ 12.5mg , Metoprolol 37.5mg  BID (1.5 tabs of 25mg ) 2. Encourage improved lifestyle - low sodium diet, continue regular exercise 3. Continue monitor BP outside office, bring readings to next visit, if persistently >140/90 or new symptoms notify office sooner 4. Follow-up 6 months with labs and f/u - will review BP control, may consider adjust meds can inc either HCTZ or Metoprolol if needed in future

## 2017-08-22 NOTE — Assessment & Plan Note (Signed)
Followed by Dermatology (Dr Phillip Heal, in [place]) S/p recent treatment Left hand/wrist SCC

## 2017-08-22 NOTE — Assessment & Plan Note (Signed)
Stable without change in LUTS Not on therapy rx Followed by [facility] urology Dr Maryan Puls Recent PSA trend per Urology reported 2.1, now result from our office was 1.9 (08/2017) - Will follow-up as needed, defer future PSA if he checks at urology

## 2017-08-22 NOTE — Assessment & Plan Note (Signed)
Mostly controlled lipids except HyperTG, chronic issue significant elevated, seems genetic component Last lipid panel 08/2017 - TG >350 Calculated ASCVD 10 yr risk score 28.8%  Plan: 1. Discussion on ASCVD risk - he understands risks and prefers to avoid new meds such as Statin, we agree emphasis on lifestyle to help improve his cholesterol and reduce risk. - Limited or no improvement on Fish Oil - he may go ahead and STOP taking Omega 3 fish oil for now, and we will re-check cholesterol TG in 6 months, if >300-500 still then strongly consider new rx Fibrate or Statin in future - he may take supplements as needed, Garlique etc 2. Continue ASA 81mg  for primary ASCVD risk reduction 3. Encourage improved lifestyle - low carb/cholesterol, reduce portion size, continue improving regular exercise 4. Follow-up 6 months lipids fasting

## 2017-08-22 NOTE — Assessment & Plan Note (Signed)
Well-controlled Pre-DM A1c up to 6.0, from prior 5.5 to 5.9 Concern with HTN, HLD  Plan:  1. Not on any therapy currently - again defer new med today 2. Encourage improved lifestyle - low carb, low sugar diet, reduce portion size, continue improving regular exercise 3. Follow-up 6 months labs/A1c

## 2017-08-22 NOTE — Patient Instructions (Addendum)
Thank you for coming to the office today.  For Triglyercides - I am not sure if fish oil is helping enough - Try stopping fish oil completely, we will see what result is in 6 months to see if you need it - May continue the other supplements - As discussed if TG >500 or persistent elevated we may consider rx medicines in future consider Statin or Fibrate medicine  A1c 6.0, slightly elevated, but still near goal, within pre diabetes - keep working on improving diet and exercise  DUE for FASTING BLOOD WORK (no food or drink after midnight before the lab appointment, only water or coffee without cream/sugar on the morning of)  SCHEDULE "Lab Only" visit in the morning at the clinic for lab draw in 6* MONTHS   - Make sure Lab Only appointment is at about 1 week before your next appointment, so that results will be available  For Lab Results, once available within 2-3 days of blood draw, you can can log in to MyChart online to view your results and a brief explanation. Also, we can discuss results at next follow-up visit.   Please schedule a Follow-up Appointment to: Return in about 6 months (around 02/21/2018) for PreDM, HTN, HLD lab follow-up.  If you have any other questions or concerns, please feel free to call the office or send a message through [place]. You may also schedule an earlier appointment if necessary.  Additionally, you may be receiving a survey about your experience at our office within a few days to 1 week by e-mail or mail. We value your feedback.  Nobie Putnam, DO [place]

## 2017-08-29 ENCOUNTER — Encounter: Payer: Self-pay | Admitting: Family Medicine

## 2017-08-29 ENCOUNTER — Ambulatory Visit (INDEPENDENT_AMBULATORY_CARE_PROVIDER_SITE_OTHER): Payer: Medicare HMO | Admitting: Family Medicine

## 2017-08-29 VITALS — BP 156/65 | HR 88 | Temp 101.1°F | Resp 16 | Ht 67.0 in | Wt 171.8 lb

## 2017-08-29 DIAGNOSIS — J0100 Acute maxillary sinusitis, unspecified: Secondary | ICD-10-CM | POA: Diagnosis not present

## 2017-08-29 MED ORDER — IPRATROPIUM BROMIDE 0.06 % NA SOLN
2.0000 | Freq: Four times a day (QID) | NASAL | 0 refills | Status: DC
Start: 2017-08-29 — End: 2018-01-30

## 2017-08-29 MED ORDER — BENZONATATE 100 MG PO CAPS: 100.0000 mg | ORAL_CAPSULE | Freq: Three times a day (TID) | ORAL | 0 refills | Status: DC | PRN

## 2017-08-29 MED ORDER — AMOXICILLIN-POT CLAVULANATE 875-125 MG PO TABS: 1.0000 | ORAL_TABLET | Freq: Two times a day (BID) | ORAL | 0 refills | Status: DC

## 2017-08-29 MED ORDER — GUAIFENESIN-CODEINE 100-10 MG/5ML PO SYRP: 5.0000 mL | ORAL_SOLUTION | Freq: Four times a day (QID) | ORAL | 0 refills | Status: DC | PRN

## 2017-08-29 NOTE — Patient Instructions (Addendum)
Thank you for coming to the office today.  1. It sounds like you have a Sinusitis (Bacterial Infection) - this most likely started as an Upper Respiratory Virus that has settled into an infection. Allergies can also cause this.  - Start Augmentin 1 pill twice daily (breakfast and dinner, with food and plenty of water) for 10 days, complete entire course, do not stop early even if feeling better - Start Loratadine (Claritin) 10mg  daily  Start Atrovent nasal spray decongestant 2 sprays in each nostril up to 4 times daily for 7 days  Start Tessalon Perls take 1 capsule up to 3 times a day as needed for cough  May use Codeine Cough Syrup as needed if cough is severe and not improving  Recommend to start taking Tylenol Extra Strength 500mg  tabs - take 1 to 2 tabs per dose (max 1000mg ) every 6-8 hours for pain (take regularly, don't skip a dose for next 7 days), max 24 hour daily dose is 6 tablets or 3000mg . In the future you can repeat the same everyday Tylenol course for 1-2 weeks at a time.   - Recommend to keep using Nasal Saline spray multiple times a day to help flush out congestion and clear sinuses - Improve hydration by drinking plenty of clear fluids (water, gatorade) to reduce secretions and thin congestion - Congestion draining down throat can cause irritation. May try warm herbal tea with honey, cough drops  If you develop persistent fever >101F for at least 3 consecutive days, headaches with sinus pain or pressure or persistent earache, please schedule a follow-up evaluation within next few days to week.   Please schedule a Follow-up Appointment to: Return in about 2 weeks (around 09/12/2017), or if symptoms worsen or fail to improve, for sinusitis.  If you have any other questions or concerns, please feel free to call the office or send a message through [place]. You may also schedule an earlier appointment if necessary.  Additionally, you may be receiving a survey about your  experience at our office within a few days to 1 week by e-mail or mail. We value your feedback.  Nobie Putnam, DO [place]

## 2017-08-29 NOTE — Progress Notes (Signed)
Subjective:    Patient ID: Kyle Kung., male    DOB: 25-Nov-1940, 77 y.o.   MRN: [id number]  Kyle Vargas [name]. is a 77 y.o. male presenting on 08/29/2017 for Sinus Problem (nasal congestion, cough, sneezing, watery eyes, chills, coughing makes sore on stomach onset 6 days)  Patient presents for a same day appointment.  HPI   ACUTE SINUSITIS Last seen here for Annual Physical 08/22/17 he was feeling well at that time. Symptoms now started onset 5-6 days ago with sinus congestion and cough, sneezing. Now cough is worsening with persistent spells more forceful hard to catch breath at times without thicker productive cough - Last dose Tylenol 0530 this AM, it was helping control fever, he was taking x 2 tablets 500mg  each every 6 hours with some relief - No known sick contacts - Used some Afrin nasal spray PRN for past 4 days - Admits intermittent episodes of fever with chills, measured up to >101F previously, today has fever. - Admits some stomach and rib muscle aches with coughing and sneezing - Denies chest pain dyspnea, ear pain, sore throat, headache   Depression screen [facility] 2/9 08/22/2017 02/15/2017 12/13/2016  Decreased Interest 0 0 0  Down, Depressed, Hopeless 0 0 0  PHQ - 2 Score 0 0 0  Altered sleeping 0 - -  Tired, decreased energy 0 - -  Change in appetite 0 - -  Feeling bad or failure about yourself  0 - -  Trouble concentrating 0 - -  Suicidal thoughts 0 - -  PHQ-9 Score 0 - -  Difficult doing work/chores Not difficult at all - -    Social History   Tobacco Use  . Smoking status: Never Smoker  . Smokeless tobacco: Never Used  Substance Use Topics  . Alcohol use: Yes    Alcohol/week: 0.6 oz    Types: 1 Glasses of wine per week    Comment: occasionally   . Drug use: No    Review of Systems Per HPI unless specifically indicated above     Objective:    BP (!) 156/65   Pulse 88   Temp (!) 101.1 F (38.4 C) (Oral)   Resp 16   Ht 5\' 7"  (1.702 m)   Wt  171 lb 12.8 oz (77.9 kg)   SpO2 97%   BMI 26.91 kg/m   Wt Readings from Last 3 Encounters:  08/29/17 171 lb 12.8 oz (77.9 kg)  08/22/17 173 lb (78.5 kg)  06/16/17 174 lb (78.9 kg)    Physical Exam  Constitutional: He is oriented to person, place, and time. He appears well-developed and well-nourished. No distress.  Well-appearing, comfortable, cooperative  HENT:  Head: Normocephalic and atraumatic.  Mouth/Throat: Oropharynx is clear and moist.  Bilateral maxillary sinuses tender. Nares with some turbinate edema without purulence. Bilateral TMs clear without erythema, effusion or bulging. Oropharynx clear without erythema, exudates, edema or asymmetry.  Eyes: Conjunctivae are normal. Right eye exhibits no discharge. Left eye exhibits no discharge.  Neck: Normal range of motion. Neck supple. No thyromegaly present.  Cardiovascular: Normal rate, regular rhythm, normal heart sounds and intact distal pulses.  No murmur heard. Pulmonary/Chest: Effort normal and breath sounds normal. No respiratory distress. He has no wheezes. He has no rales.  Good air movement. Occasional coughing. No wheezing or crackles.  Musculoskeletal: Normal range of motion. He exhibits no edema.  Lymphadenopathy:    He has no cervical adenopathy.  Neurological: He is alert and oriented  to person, place, and time.  Skin: Skin is warm and dry. No rash noted. He is not diaphoretic. No erythema.  Psychiatric: He has a normal mood and affect. His behavior is normal.  Well groomed, good eye contact, normal speech and thoughts  Nursing note and vitals reviewed.  Results for orders placed or performed in visit on 08/15/17  PSA, Total with Reflex to PSA, Free  Result Value Ref Range   PSA, Total 1.9 < OR = 4.0 ng/mL  CBC with Differential/Platelet  Result Value Ref Range   WBC 6.5 3.8 - 10.8 Thousand/uL   RBC 4.47 4.20 - 5.80 Million/uL   Hemoglobin 14.4 13.2 - 17.1 g/dL   HCT 40.8 38.5 - 50.0 %   MCV 91.3 80.0 -  100.0 fL   MCH 32.2 27.0 - 33.0 pg   MCHC 35.3 32.0 - 36.0 g/dL   RDW 13.6 11.0 - 15.0 %   Platelets 233 140 - 400 Thousand/uL   MPV 9.9 7.5 - 12.5 fL   Neutro Abs 4,219 1,500 - 7,800 cells/uL   Lymphs Abs 1,495 850 - 3,900 cells/uL   WBC mixed population 566 200 - 950 cells/uL   Eosinophils Absolute 163 15 - 500 cells/uL   Basophils Absolute 59 0 - 200 cells/uL   Neutrophils Relative % 64.9 %   Total Lymphocyte 23.0 %   Monocytes Relative 8.7 %   Eosinophils Relative 2.5 %   Basophils Relative 0.9 %  Lipid panel  Result Value Ref Range   Cholesterol 140 <200 mg/dL   HDL 36 (L) >40 mg/dL   Triglycerides 353 (H) <150 mg/dL   LDL Cholesterol (Calc) 61 mg/dL (calc)   Total CHOL/HDL Ratio 3.9 <5.0 (calc)   Non-HDL Cholesterol (Calc) 104 <130 mg/dL (calc)  Hemoglobin A1c  Result Value Ref Range   Hgb A1c MFr Bld 6.0 (H) <5.7 % of total Hgb   Mean Plasma Glucose 126 (calc)   eAG (mmol/L) 7.0 (calc)  COMPLETE METABOLIC PANEL WITH GFR  Result Value Ref Range   Glucose, Bld 100 (H) 65 - 99 mg/dL   BUN 15 7 - 25 mg/dL   Creat 1.01 0.70 - 1.18 mg/dL   GFR, Est Non African American 72 > OR = 60 mL/min/1.4m2   GFR, Est African American 84 > OR = 60 mL/min/1.28m2   BUN/Creatinine Ratio NOT APPLICABLE 6 - 22 (calc)   Sodium 143 135 - 146 mmol/L   Potassium 4.1 3.5 - 5.3 mmol/L   Chloride 106 98 - 110 mmol/L   CO2 31 20 - 32 mmol/L   Calcium 9.4 8.6 - 10.3 mg/dL   Total Protein 6.6 6.1 - 8.1 g/dL   Albumin 4.1 3.6 - 5.1 g/dL   Globulin 2.5 1.9 - 3.7 g/dL (calc)   AG Ratio 1.6 1.0 - 2.5 (calc)   Total Bilirubin 0.4 0.2 - 1.2 mg/dL   Alkaline phosphatase (APISO) 121 (H) 40 - 115 U/L   AST 17 10 - 35 U/L   ALT 23 9 - 46 U/L      Assessment & Plan:   Problem List Items Addressed This Visit    None    Visit Diagnoses    Acute non-recurrent maxillary sinusitis    -  Primary   Relevant Medications   amoxicillin-clavulanate (AUGMENTIN) 875-125 MG tablet   ipratropium (ATROVENT)  0.06 % nasal spray   guaiFENesin-codeine (ROBITUSSIN AC) 100-10 MG/5ML syrup   benzonatate (TESSALON) 100 MG capsule   loratadine (CLARITIN) 10 MG  tablet      Consistent with acute maxillary sinusitis, likely initially viral URI vs allergic rhinitis component with worsening concern for bacterial infection now w/ persistent higher fever. - No focal sign of pulmonary infection - Not entirely consistent w/ flu, declines test - Previously had similar sinusitis about 2 months ago  Plan: 1. Start Augmentin 875-125mg  PO BID x 10 days - Tylenol 1g TID 2. Resume Loratadine (Claritin) 10mg  daily  3. Start Atrovent nasal spray decongestant 2 sprays in each nostril up to 4 times daily for 7 days - Hold afrin may use rarely avoid rebound 4. Start Tessalon Perls take 1 capsule up to 3 times a day as needed for cough - then offered virtussin cough syrup w/ codeine given significant pain with coughing spells now for PRN use - Improve hydration, may use OTC meds Return criteria reviewed   Meds ordered this encounter  Medications  . amoxicillin-clavulanate (AUGMENTIN) 875-125 MG tablet    Sig: Take 1 tablet by mouth 2 (two) times daily.    Dispense:  20 tablet    Refill:  0  . ipratropium (ATROVENT) 0.06 % nasal spray    Sig: Place 2 sprays into both nostrils 4 (four) times daily. For up to 5-7 days then stop.    Dispense:  15 mL    Refill:  0  . guaiFENesin-codeine (ROBITUSSIN AC) 100-10 MG/5ML syrup    Sig: Take 5-10 mLs by mouth 4 (four) times daily as needed for cough.    Dispense:  118 mL    Refill:  0  . benzonatate (TESSALON) 100 MG capsule    Sig: Take 1 capsule (100 mg total) by mouth 3 (three) times daily as needed for cough.    Dispense:  30 capsule    Refill:  0     Follow up plan: Return in about 2 weeks (around 09/12/2017), or if symptoms worsen or fail to improve, for sinusitis.   Nobie Putnam, [facility] 08/29/2017,  10:06 PM

## 2017-09-04 ENCOUNTER — Telehealth: Payer: Self-pay | Admitting: Family Medicine

## 2017-09-04 NOTE — Telephone Encounter (Signed)
Pt was seen in 04/23 still has same Sxs runny nose, sinusitis and now cough is getting worst taken all the meds as it was Rx but no improvement. Does he needs to ne seen please suggest.

## 2017-09-04 NOTE — Telephone Encounter (Signed)
Called patient back to review. He was seen by me on 08/29/17 for recurrent sinusitis, see note for details.  Now he calls complains of still sinus congestion and some worse cough. Denies fevers or chills or sinus pain. No other significant worse symptoms, seems like cough is bothering him most, will have some occasional shallow breath and coughing spells. He is not asthmatic never smoker. Not on inhaler and not interested now.  I advised not sure what else to change in his treatment, if after 1 week so far on Augmentin it did not clear up, most likely not bacterial as cause, likely allergic.  He will finish current course of Augmentin. Continues Atrovent PRN, and Loratadine 10mg  daily.  I advised him he can continue with nasal saline, he may switch atrovent to a Flonase in future. Limit Afrin.  If cough and breathing worse he can schedule to follow-up in office we can consider re-eval lungs vs x-ray vs albuterol may have reactive bronchospasm.  If sinus worse or signs of infection fever or pain or purulence, then we could switch antibiotic. Or refer him to ENT if not improving.  He will wait few more days and check back with [place]  Kyle Vargas, [facility] 09/04/2017, 12:33 PM

## 2017-09-06 ENCOUNTER — Telehealth: Payer: Self-pay | Admitting: Family Medicine

## 2017-09-06 DIAGNOSIS — J0101 Acute recurrent maxillary sinusitis: Secondary | ICD-10-CM

## 2017-09-06 NOTE — Telephone Encounter (Signed)
Referral placed to [facility] ENT for recurrent sinusitis, limited improvement on multiple therapies including antibiotics Augmentin, Atrovent nasal, among others.  See last office visit note, 08/29/17.  He may need to follow-up sooner if he is not able to get into ENT if he has any worsening breathing before they can see him.  Nobie Putnam, [facility] 09/06/2017, 6:21 PM

## 2017-09-06 NOTE — Telephone Encounter (Signed)
Pt. Called requesting a referral to  ENT

## 2017-09-22 DIAGNOSIS — H6123 Impacted cerumen, bilateral: Secondary | ICD-10-CM | POA: Diagnosis not present

## 2017-09-22 DIAGNOSIS — J301 Allergic rhinitis due to pollen: Secondary | ICD-10-CM | POA: Diagnosis not present

## 2017-10-19 ENCOUNTER — Other Ambulatory Visit: Payer: Self-pay

## 2017-10-19 MED ORDER — GLUCOSE BLOOD VI STRP: ORAL_STRIP | 12 refills | Status: AC

## 2017-12-11 ENCOUNTER — Other Ambulatory Visit: Payer: Self-pay | Admitting: Family Medicine

## 2017-12-11 DIAGNOSIS — I10 Essential (primary) hypertension: Secondary | ICD-10-CM

## 2017-12-12 ENCOUNTER — Other Ambulatory Visit: Payer: Self-pay

## 2017-12-12 MED ORDER — DILTIAZEM HCL ER COATED BEADS 240 MG PO CP24: 240.0000 mg | ORAL_CAPSULE | Freq: Every morning | ORAL | 3 refills | Status: DC

## 2017-12-14 DIAGNOSIS — H2513 Age-related nuclear cataract, bilateral: Secondary | ICD-10-CM | POA: Diagnosis not present

## 2017-12-19 ENCOUNTER — Other Ambulatory Visit: Payer: Self-pay | Admitting: Nurse Practitioner

## 2017-12-19 ENCOUNTER — Ambulatory Visit (INDEPENDENT_AMBULATORY_CARE_PROVIDER_SITE_OTHER): Payer: Medicare HMO

## 2017-12-19 VITALS — BP 150/80 | HR 64 | Temp 97.6°F | Resp 16 | Ht 67.0 in | Wt 171.4 lb

## 2017-12-19 DIAGNOSIS — I10 Essential (primary) hypertension: Secondary | ICD-10-CM

## 2017-12-19 DIAGNOSIS — Z0000 Encounter for general adult medical examination without abnormal findings: Secondary | ICD-10-CM

## 2017-12-19 MED ORDER — BLOOD PRESSURE CUFF MISC: 1.0000 | Freq: Every day | 0 refills | Status: AC

## 2017-12-19 NOTE — Progress Notes (Addendum)
Subjective:   Kyle Vargas. is a 77 y.o. male who presents for Medicare Annual/Subsequent preventive examination.  Review of Systems:    Cardiac Risk Factors include: advanced age (>19men, >49 women);dyslipidemia;hypertension;male gender     Objective:    Vitals: BP (!) 150/80 (BP Location: Left Arm, Cuff Size: Normal)   Pulse 64   Temp 97.6 F (36.4 C) (Oral)   Resp 16   Ht 5\' 7"  (1.702 m)   Wt 171 lb 6.4 oz (77.7 kg)   BMI 26.85 kg/m   Body mass index is 26.85 kg/m.  Advanced Directives 12/19/2017 02/08/2017 12/13/2016  Does Patient Have a Medical Advance Directive? Yes Yes Yes  Type of Paramedic of [place];Living will [place];Living will [place];Living will  Copy of [place] in Chart? No - copy requested No - copy requested No - copy requested    Tobacco Social History   Tobacco Use  Smoking Status Never Smoker  Smokeless Tobacco Never Used     Counseling given: Not Answered   Clinical Intake:  Pre-visit preparation completed: Yes  Pain : No/denies pain     Nutritional Status: BMI 25 -29 Overweight Nutritional Risks: None Diabetes: No  How often do you need to have someone help you when you read instructions, pamphlets, or other written materials from your doctor or pharmacy?: 1 - Never What is the last grade level you completed in school?: some college   Interpreter Needed?: No  Information entered by :: Tiffany Hill,LPN   Past Medical History:  Diagnosis Date  . Allergic rhinitis, cause unspecified   . Diarrhea   . Dysrhythmia   . Esophageal reflux   . H/O diverticulitis of colon   . History of kidney stones   . Hypertension   . Other and unspecified hyperlipidemia   . PVC's (premature ventricular contractions)    Right ventricular outflow tract   Past Surgical History:  Procedure Laterality Date  . BASAL CELL CARCINOMA EXCISION    . COLONOSCOPY      . COLONOSCOPY WITH PROPOFOL N/A 02/08/2017   Procedure: COLONOSCOPY WITH PROPOFOL;  Surgeon: Manya Silvas, MD;  Location: [facility] ENDOSCOPY;  Service: Endoscopy;  Laterality: N/A;  . REPAIR IMPERFORATE ANUS / ANORECTOPLASTY    . TRANSURETHRAL RESECTION OF PROSTATE     Family History  Problem Relation Age of Onset  . Hypertension Father    Social History   Socioeconomic History  . Marital status: Married    Spouse name: Not on file  . Number of children: Not on file  . Years of education: Not on file  . Highest education level: Some college, no degree  Occupational History  . Not on file  Social Needs  . Financial resource strain: Not hard at all  . Food insecurity:    Worry: Never true    Inability: Never true  . Transportation needs:    Medical: No    Non-medical: No  Tobacco Use  . Smoking status: Never Smoker  . Smokeless tobacco: Never Used  Substance and Sexual Activity  . Alcohol use: Yes    Comment: occasionally  2 glasses of wine a month   . Drug use: No  . Sexual activity: Not on file  Lifestyle  . Physical activity:    Days per week: 5 days    Minutes per session: 30 min  . Stress: Not at all  Relationships  . Social connections:  Talks on phone: Once a week    Gets together: More than three times a week    Attends religious service: More than 4 times per year    Active member of club or organization: No    Attends meetings of clubs or organizations: Never    Relationship status: Married  Other Topics Concern  . Not on file  Social History Narrative  . Not on file    Outpatient Encounter Medications as of 12/19/2017  Medication Sig  . aspirin 81 MG tablet Take 81 mg by mouth daily.  Marland Kitchen diltiazem (CARDIZEM CD) 240 MG 24 hr capsule Take 1 capsule (240 mg total) by mouth every morning.  . Garlic 10 MG CAPS Take by mouth. Unsure of dose  . glucose blood test strip ONETOUCH ULTRA BLUE (In Vitro Strip)  1 (one) Strip Strip check blood sugar once daily  for 0 days  Quantity: 50;  Refills: 12   Ordered :02-Oct-2013  [name];  Started 03-Jul-2013 Active  . glucose blood test strip Use as instructed  . hydrochlorothiazide (HYDRODIURIL) 12.5 MG tablet Take 1 tablet (12.5 mg total) by mouth daily.  Marland Kitchen loratadine (CLARITIN) 10 MG tablet Take 10 mg by mouth daily.  Marland Kitchen losartan (COZAAR) 100 MG tablet Take 1 tablet (100 mg total) by mouth daily.  . metoprolol tartrate (LOPRESSOR) 25 MG tablet Take 1.5 tabs twice daily  . omega-3 acid ethyl esters (LOVAZA) 1 g capsule Take by mouth.  . Sodium Chloride-Sodium Bicarb (AYR SALINE NASAL NETI RINSE) 1.57 G PACK Place into the nose as needed.   Marland Kitchen amoxicillin-clavulanate (AUGMENTIN) 875-125 MG tablet Take 1 tablet by mouth 2 (two) times daily. (Patient not taking: Reported on 12/19/2017)  . benzonatate (TESSALON) 100 MG capsule Take 1 capsule (100 mg total) by mouth 3 (three) times daily as needed for cough. (Patient not taking: Reported on 12/19/2017)  . guaiFENesin-codeine (ROBITUSSIN AC) 100-10 MG/5ML syrup Take 5-10 mLs by mouth 4 (four) times daily as needed for cough. (Patient not taking: Reported on 12/19/2017)  . ipratropium (ATROVENT) 0.06 % nasal spray Place 2 sprays into both nostrils 4 (four) times daily. For up to 5-7 days then stop. (Patient not taking: Reported on 12/19/2017)  . NON FORMULARY Take 1 tablet by mouth daily. Cholestoff 2 tablets daily.   No facility-administered encounter medications on file as of 12/19/2017.     Activities of Daily Living In your present state of health, do you have any difficulty performing the following activities: 12/19/2017  Hearing? N  Vision? N  Difficulty concentrating or making decisions? N  Walking or climbing stairs? N  Dressing or bathing? N  Doing errands, shopping? N  Preparing Food and eating ? N  Using the Toilet? N  In the past six months, have you accidently leaked urine? N  Do you have problems with loss of bowel control? N  Managing  your Medications? N  Managing your Finances? N  Housekeeping or managing your Housekeeping? N  Some recent data might be hidden    Patient Care Team: [name], DO as PCP - General (Family Medicine) Jannet Mantis, MD (Dermatology) Royston Cowper, MD as Consulting Physician (Urology) Wellington Hampshire, MD as Consulting Physician (Cardiology) Anell Barr, OD (Optometry)   Assessment:   This is a routine wellness examination for [place].  Exercise Activities and Dietary recommendations Current Exercise Habits: Home exercise routine, Type of exercise: walking, Time (Minutes): 30, Frequency (Times/Week): 5, Weekly Exercise (Minutes/Week):  150, Intensity: Moderate, Exercise limited by: None identified  Goals    . Exercise 3x per week (30 min per time)     Patient wants to continue walking daily to stay healthy. He currently walks 30 mins five days a week.    . Increase water intake     Recommend drinking at least 6-8 glasses of water a day        Fall Risk Fall Risk  12/19/2017 02/15/2017 12/13/2016 06/07/2016 04/25/2016  Falls in the past year? No No No No No   Is the patient's home free of loose throw rugs in walkways, pet beds, electrical cords, etc?   yes       Grab bars in the bathroom? no      Handrails on the stairs?   yes      Adequate lighting?   yes  Timed Get Up and Go Performed: Completed in 8 seconds with no use of assistive devices, steady gait. No intervention needed at this time.   Depression Screen PHQ 2/9 Scores 12/19/2017 08/22/2017 02/15/2017 12/13/2016  PHQ - 2 Score 0 0 0 0  PHQ- 9 Score - 0 - -    Cognitive Function MMSE - Mini Mental State Exam 01/23/2015  Orientation to time 5  Orientation to Place 5  Registration 3  Attention/ Calculation 5  Recall 2  Language- name 2 objects 2  Language- repeat 1  Language- follow 3 step command 3  Language- read & follow direction 1  Write a sentence 1  Copy design 1  Total score 29      6CIT Screen 12/19/2017 12/13/2016  What Year? 0 points 0 points  What month? 0 points 0 points  What time? 0 points 0 points  Count back from 20 0 points 0 points  Months in reverse 0 points 0 points  Repeat phrase 0 points 0 points  Total Score 0 0    Immunization History  Administered Date(s) Administered  . Influenza, High Dose Seasonal PF 02/11/2014, 01/23/2015, 02/04/2016, 02/15/2017  . Influenza-Unspecified 03/09/2012, 03/14/2014  . Pneumococcal Conjugate-13 01/21/2014  . Pneumococcal Polysaccharide-23 03/09/2008  . Tdap 01/21/2014, 11/29/2016  . Zoster 01/23/2015    Qualifies for Shingles Vaccine? Yes, discussed shingrix vaccine   Screening Tests Health Maintenance  Topic Date Due  . INFLUENZA VACCINE  12/07/2017  . TETANUS/TDAP  11/30/2026  . COLONOSCOPY  02/09/2027  . PNA vac Low Risk Adult  Completed   Cancer Screenings: Lung: Low Dose CT Chest recommended if Age 66-80 years, 30 pack-year currently smoking OR have quit w/in 15years. Patient does not qualify. Colorectal: completed 02/08/2017  Additional Screenings:  Hepatitis C Screening: not indicated       Plan:    I have personally reviewed and addressed the Medicare Annual Wellness questionnaire and have noted the following in the patient's chart:  A. Medical and social history B. Use of alcohol, tobacco or illicit drugs  C. Current medications and supplements D. Functional ability and status E.  Nutritional status F.  Physical activity G. Advance directives H. List of other physicians I.  Hospitalizations, surgeries, and ER visits in previous 12 months J.  [place] such as hearing and vision if needed, cognitive and depression L. Referrals and appointments   In addition, I have reviewed and discussed with patient certain preventive protocols, quality metrics, and best practice recommendations. A written personalized care plan for preventive services as well as general preventive health  recommendations were provided to patient.  Signed,  Tyler Aas, LPN Nurse Health Advisor   Nurse Notes:  Patient brought in home blood pressure readings as listed below  12/05/17: 8pm 106/46 HR 73 12/14/2017 5pm 94/46 HR 62 12/18/2017 5pm 101/49 HR 60 12/18/2017 10pm 101/45 HR 67 12/19/2017 8:45am 96/48 Hr 72  BP was checked today at the office at 172/80, rechecked 20 min after and it was 150/80. Patient states it was about 140/70 at dentist office the other day as well. Mr.Beem states his BP cuff is probably about 76 years old so he is unsure how accurate it is. He does state he has been extremely tired when he  Decides to check it but declines any dizziness or headaches.   Discussed with Dr.Karamalegos- patient is on hctz that he just started taking at night instead of in the morning this week, losartan at night, metoprolol 1.5 tabs twice a day and diltiazem in the morning. Dr.Karamalegos would like patient to reach out to cardiologist- Dr.Arida to discuss his tiredness as he is taking metoprolol and diltiazem. Patient verbalized understanding.  Mr.Agcaoili is scheduled for follow up with Dr.Karamalegos and Dr.Arida in October. Patient was advised to bring BP cuff to office or pharmacy to check to make sure it is accurate. He states he will bring BP cuff back today to check it.    Addended 8/13/2019at 1144- patient brought home BP cuff in. Patient checked BP on right upper arm with his home BP cuff which read 117/60. I then checked BP on right upper arm manually with normal size cuff and reading obtained for 152/82. Patient advised home BP cuff was likely inaccurate and not calibrated and a new rx would be sent over to his pharmacy for new BP cuff. Pt verbalized understanding and will call with any abnormal readings with new BP cuff once received.

## 2017-12-19 NOTE — Patient Instructions (Addendum)
Kyle Vargas , Thank you for taking time to come for your Medicare Wellness Visit. I appreciate your ongoing commitment to your health goals. Please review the following plan we discussed and let me know if I can assist you in the future.   Screening recommendations/referrals: Colonoscopy: completed 02/08/2017 Recommended yearly ophthalmology/optometry visit for glaucoma screening and checkup Recommended yearly dental visit for hygiene and checkup  Vaccinations: Influenza vaccine: due 01/2018 Pneumococcal vaccine: completed series Tdap vaccine: up to date Shingles vaccine: shingrix eligible, check with your insurance company for coverage    Advanced directives: Please bring a copy of your health care power of attorney and living will to the office at your convenience.  Conditions/risks identified: recommend drinking at least 6-8 glasses of water a day.  Next appointment:  Follow up on 02/14/2018 at 8:00am for lab work at [facility]. Follow up on 02/21/2018 at 8:00 with Dr.Karamalegos.  Follow up in one year for your annual wellness exam.   Preventive Care 76 Years and Older, Male Preventive care refers to lifestyle choices and visits with your health care provider that can promote health and wellness. What does preventive care include?  A yearly physical exam. This is also called an annual well check.  Dental exams once or twice a year.  Routine eye exams. Ask your health care provider how often you should have your eyes checked.  Personal lifestyle choices, including:  Daily care of your teeth and gums.  Regular physical activity.  Eating a healthy diet.  Avoiding tobacco and drug use.  Limiting alcohol use.  Practicing safe sex.  Taking low doses of aspirin every day.  Taking vitamin and mineral supplements as recommended by your health care provider. What happens during an annual well check? The services and screenings done by your health care provider during your annual  well check will depend on your age, overall health, lifestyle risk factors, and family history of disease. Counseling  Your health care provider may ask you questions about your:  Alcohol use.  Tobacco use.  Drug use.  Emotional well-being.  Home and relationship well-being.  Sexual activity.  Eating habits.  History of falls.  Memory and ability to understand (cognition).  Work and work Statistician. Screening  You may have the following tests or measurements:  Height, weight, and BMI.  Blood pressure.  Lipid and cholesterol levels. These may be checked every 5 years, or more frequently if you are over 76 years old.  Skin check.  Lung cancer screening. You may have this screening every year starting at age 76 if you have a 30-pack-year history of smoking and currently smoke or have quit within the past 15 years.  Fecal occult blood test (FOBT) of the stool. You may have this test every year starting at age 76.  Flexible sigmoidoscopy or colonoscopy. You may have a sigmoidoscopy every 5 years or a colonoscopy every 10 years starting at age 76.  Prostate cancer screening. Recommendations will vary depending on your family history and other risks.  Hepatitis C blood test.  Hepatitis B blood test.  Sexually transmitted disease (STD) testing.  Diabetes screening. This is done by checking your blood sugar (glucose) after you have not eaten for a while (fasting). You may have this done every 1-3 years.  Abdominal aortic aneurysm (AAA) screening. You may need this if you are a current or former smoker.  Osteoporosis. You may be screened starting at age 76 if you are at high risk. Talk with your health  care provider about your test results, treatment options, and if necessary, the need for more tests. Vaccines  Your health care provider may recommend certain vaccines, such as:  Influenza vaccine. This is recommended every year.  Tetanus, diphtheria, and acellular  pertussis (Tdap, Td) vaccine. You may need a Td booster every 10 years.  Zoster vaccine. You may need this after age 76.  Pneumococcal 13-valent conjugate (PCV13) vaccine. One dose is recommended after age 76.  Pneumococcal polysaccharide (PPSV23) vaccine. One dose is recommended after age 76. Talk to your health care provider about which screenings and vaccines you need and how often you need them. This information is not intended to replace advice given to you by your health care provider. Make sure you discuss any questions you have with your health care provider. Document Released: 05/22/2015 Document Revised: 01/13/2016 Document Reviewed: 02/24/2015 Elsevier Interactive Patient Education  2017 [place] Prevention in the Home Falls can cause injuries. They can happen to people of all ages. There are many things you can do to make your home safe and to help prevent falls. What can I do on the outside of my home?  Regularly fix the edges of walkways and driveways and fix any cracks.  Remove anything that might make you trip as you walk through a door, such as a raised step or threshold.  Trim any bushes or trees on the path to your home.  Use bright outdoor lighting.  Clear any walking paths of anything that might make someone trip, such as rocks or tools.  Regularly check to see if handrails are loose or broken. Make sure that both sides of any steps have handrails.  Any raised decks and porches should have guardrails on the edges.  Have any leaves, snow, or ice cleared regularly.  Use sand or salt on walking paths during winter.  Clean up any spills in your garage right away. This includes oil or grease spills. What can I do in the bathroom?  Use night lights.  Install grab bars by the toilet and in the tub and shower. Do not use towel bars as grab bars.  Use non-skid mats or decals in the tub or shower.  If you need to sit down in the shower, use a plastic,  non-slip stool.  Keep the floor dry. Clean up any water that spills on the floor as soon as it happens.  Remove soap buildup in the tub or shower regularly.  Attach bath mats securely with double-sided non-slip rug tape.  Do not have throw rugs and other things on the floor that can make you trip. What can I do in the bedroom?  Use night lights.  Make sure that you have a light by your bed that is easy to reach.  Do not use any sheets or blankets that are too big for your bed. They should not hang down onto the floor.  Have a firm chair that has side arms. You can use this for support while you get dressed.  Do not have throw rugs and other things on the floor that can make you trip. What can I do in the kitchen?  Clean up any spills right away.  Avoid walking on wet floors.  Keep items that you use a lot in easy-to-reach places.  If you need to reach something above you, use a strong step stool that has a grab bar.  Keep electrical cords out of the way.  Do not use floor  polish or wax that makes floors slippery. If you must use wax, use non-skid floor wax.  Do not have throw rugs and other things on the floor that can make you trip. What can I do with my stairs?  Do not leave any items on the stairs.  Make sure that there are handrails on both sides of the stairs and use them. Fix handrails that are broken or loose. Make sure that handrails are as long as the stairways.  Check any carpeting to make sure that it is firmly attached to the stairs. Fix any carpet that is loose or worn.  Avoid having throw rugs at the top or bottom of the stairs. If you do have throw rugs, attach them to the floor with carpet tape.  Make sure that you have a light switch at the top of the stairs and the bottom of the stairs. If you do not have them, ask someone to add them for you. What else can I do to help prevent falls?  Wear shoes that:  Do not have high heels.  Have rubber  bottoms.  Are comfortable and fit you well.  Are closed at the toe. Do not wear sandals.  If you use a stepladder:  Make sure that it is fully opened. Do not climb a closed stepladder.  Make sure that both sides of the stepladder are locked into place.  Ask someone to hold it for you, if possible.  Clearly mark and make sure that you can see:  Any grab bars or handrails.  First and last steps.  Where the edge of each step is.  Use tools that help you move around (mobility aids) if they are needed. These include:  Canes.  Walkers.  Scooters.  Crutches.  Turn on the lights when you go into a dark area. Replace any light bulbs as soon as they burn out.  Set up your furniture so you have a clear path. Avoid moving your furniture around.  If any of your floors are uneven, fix them.  If there are any pets around you, be aware of where they are.  Review your medicines with your doctor. Some medicines can make you feel dizzy. This can increase your chance of falling. Ask your doctor what other things that you can do to help prevent falls. This information is not intended to replace advice given to you by your health care provider. Make sure you discuss any questions you have with your health care provider. Document Released: 02/19/2009 Document Revised: 10/01/2015 Document Reviewed: 05/30/2014 Elsevier Interactive Patient Education  2017 [employer].

## 2018-01-30 ENCOUNTER — Ambulatory Visit (INDEPENDENT_AMBULATORY_CARE_PROVIDER_SITE_OTHER): Payer: Medicare HMO | Admitting: Family Medicine

## 2018-01-30 ENCOUNTER — Encounter: Payer: Self-pay | Admitting: Family Medicine

## 2018-01-30 VITALS — BP 152/65 | HR 73 | Temp 98.6°F | Resp 16 | Ht 67.0 in | Wt 172.0 lb

## 2018-01-30 DIAGNOSIS — I10 Essential (primary) hypertension: Secondary | ICD-10-CM

## 2018-01-30 DIAGNOSIS — J0100 Acute maxillary sinusitis, unspecified: Secondary | ICD-10-CM

## 2018-01-30 MED ORDER — AMOXICILLIN-POT CLAVULANATE 875-125 MG PO TABS: 1.0000 | ORAL_TABLET | Freq: Two times a day (BID) | ORAL | 0 refills | Status: DC

## 2018-01-30 MED ORDER — IPRATROPIUM BROMIDE 0.06 % NA SOLN: 2.0000 | Freq: Four times a day (QID) | NASAL | 0 refills | Status: DC

## 2018-01-30 NOTE — Patient Instructions (Addendum)
Thank you for coming to the office today.  Likely elevated BP due to sinuses  1. It sounds like you have a Sinusitis (Bacterial Infection) - this most likely started as an Upper Respiratory Virus that has settled into an infection. Allergies can also cause this.  - Start Augmentin 1 pill twice daily (breakfast and dinner, with food and plenty of water) for 10 days, complete entire course, do not stop early even if feeling better - Continue Loratadine (Claritin) 10mg  daily  Start Atrovent nasal spray decongestant 2 sprays in each nostril up to 4 times daily for 7 days  AFTER CONGESTION IMPROVES - MAY restart nasal steroid Flonase 2 sprays in each nostril daily for 4-6 weeks, may repeat course seasonally or as needed  May take Coricidin HBP for cough if need  Recommend to start taking Tylenol Extra Strength 500mg  tabs - take 1 to 2 tabs per dose (max 1000mg ) every 6-8 hours for pain (take regularly, don't skip a dose for next 7 days), max 24 hour daily dose is 6 tablets or 3000mg . In the future you can repeat the same everyday Tylenol course for 1-2 weeks at a time.   - Recommend to keep using Nasal Saline spray multiple times a day to help flush out congestion and clear sinuses - Improve hydration by drinking plenty of clear fluids (water, gatorade) to reduce secretions and thin congestion - Congestion draining down throat can cause irritation. May try warm herbal tea with honey, cough drops  If you develop persistent fever >101F for at least 3 consecutive days, headaches with sinus pain or pressure or persistent earache, please schedule a follow-up evaluation within next few days to week.   Please schedule a Follow-up Appointment to: Return in about 1 week (around 02/06/2018), or if symptoms worsen or fail to improve, for sinusitis sooner if needed, as scheduled in October 2019.  If you have any other questions or concerns, please feel free to call the office or send a message through  Welcome. You may also schedule an earlier appointment if necessary.  Additionally, you may be receiving a survey about your experience at our office within a few days to 1 week by e-mail or mail. We value your feedback.  Nobie Putnam, DO [place]

## 2018-01-30 NOTE — Progress Notes (Signed)
Subjective:    Patient ID: Kyle Vargas., male    DOB: 01-05-1941, 77 y.o.   MRN: [id number]  Kyle Vargas. is a 77 y.o. male presenting on 01/30/2018 for Hypertension and Sinusitis  Patient presents for a same day appointment.  HPI   SINUSITIS / HYPERTENSION - Brief background information, patient has been seen for similar sinusitis in past, last 08/2017 by me treated with course of antibiotic among other therapy for allergy symptoms, referred to [facility] in 09/2017 and ultimately they did not have much additional treatment, given Astelin nose spray anti histamine, offered future CT if recurrences. - Now he reports elevated BP in past few days 4-5 days. Initially elevated in range of 140s-160s / 80, and he had a temporary elevated BP recently up to 180-190 on SBP. He was concerned, without new symptoms related to this. - Recent sick contact grand-daugther had a cold URI, then his wife got URI and then he got it. He states about 8 days ago he developed sinus congestion and pressure. Now without any improvement, seems to have some worsening sinus pain and pressure, with sinus headaches. Takes AM Tylenol regularly PRN with some relief. Takes Tylenol PM.  - Admits post pharyngeal drainage - Denies any fever, chills, sweats, nausea vomiting, abdominal pain, headache, loss of vision, chest pain, dyspnea, near syncope or dizzy lightheaded  Health Maintenance: Due Flu vaccine, given current URI sinusitis, will defer for now. Return 2-3 weeks.  Depression screen [facility] 2/9 01/30/2018 12/19/2017 08/22/2017  Decreased Interest 0 0 0  Down, Depressed, Hopeless 0 0 0  PHQ - 2 Score 0 0 0  Altered sleeping - - 0  Tired, decreased energy - - 0  Change in appetite - - 0  Feeling bad or failure about yourself  - - 0  Trouble concentrating - - 0  Suicidal thoughts - - 0  PHQ-9 Score - - 0  Difficult doing work/chores - - Not difficult at all    Social History   Tobacco Use  . Smoking  status: Never Smoker  . Smokeless tobacco: Never Used  Substance Use Topics  . Alcohol use: Yes    Comment: occasionally  2 glasses of wine a month   . Drug use: No    Review of Systems Per HPI unless specifically indicated above     Objective:    BP (!) 152/65   Pulse 73   Temp 98.6 F (37 C) (Oral)   Resp 16   Ht 5\' 7"  (1.702 m)   Wt 172 lb (78 kg)   BMI 26.94 kg/m   Wt Readings from Last 3 Encounters:  01/30/18 172 lb (78 kg)  12/19/17 171 lb 6.4 oz (77.7 kg)  08/29/17 171 lb 12.8 oz (77.9 kg)    Physical Exam  Constitutional: He is oriented to person, place, and time. He appears well-developed and well-nourished. No distress.  Well-appearing, comfortable, cooperative  HENT:  Head: Normocephalic and atraumatic.  Mouth/Throat: Oropharynx is clear and moist.  Maxillary sinus L side mild tender. Nares patent with deeper turbinate edema without purulence. Bilateral TMs clear without erythema, only mild effusion, no bulging. Oropharynx clear without erythema, exudates, edema or asymmetry.  Eyes: Conjunctivae are normal. Right eye exhibits no discharge. Left eye exhibits no discharge.  Cardiovascular: Normal rate, regular rhythm, normal heart sounds and intact distal pulses.  No murmur heard. Pulmonary/Chest: Effort normal and breath sounds normal. No respiratory distress. He has no wheezes. He has  no rales.  Good air movement.  Musculoskeletal: He exhibits no edema.  Neurological: He is alert and oriented to person, place, and time.  Skin: Skin is warm and dry. No rash noted. He is not diaphoretic. No erythema.  Psychiatric: He has a normal mood and affect. His behavior is normal.  Well groomed, good eye contact, normal speech and thoughts  Nursing note and vitals reviewed.  Results for orders placed or performed in visit on 08/15/17  PSA, Total with Reflex to PSA, Free  Result Value Ref Range   PSA, Total 1.9 < OR = 4.0 ng/mL  CBC with Differential/Platelet  Result  Value Ref Range   WBC 6.5 3.8 - 10.8 Thousand/uL   RBC 4.47 4.20 - 5.80 Million/uL   Hemoglobin 14.4 13.2 - 17.1 g/dL   HCT 40.8 38.5 - 50.0 %   MCV 91.3 80.0 - 100.0 fL   MCH 32.2 27.0 - 33.0 pg   MCHC 35.3 32.0 - 36.0 g/dL   RDW 13.6 11.0 - 15.0 %   Platelets 233 140 - 400 Thousand/uL   MPV 9.9 7.5 - 12.5 fL   Neutro Abs 4,219 1,500 - 7,800 cells/uL   Lymphs Abs 1,495 850 - 3,900 cells/uL   WBC mixed population 566 200 - 950 cells/uL   Eosinophils Absolute 163 15 - 500 cells/uL   Basophils Absolute 59 0 - 200 cells/uL   Neutrophils Relative % 64.9 %   Total Lymphocyte 23.0 %   Monocytes Relative 8.7 %   Eosinophils Relative 2.5 %   Basophils Relative 0.9 %  Lipid panel  Result Value Ref Range   Cholesterol 140 <200 mg/dL   HDL 36 (L) >40 mg/dL   Triglycerides 353 (H) <150 mg/dL   LDL Cholesterol (Calc) 61 mg/dL (calc)   Total CHOL/HDL Ratio 3.9 <5.0 (calc)   Non-HDL Cholesterol (Calc) 104 <130 mg/dL (calc)  Hemoglobin A1c  Result Value Ref Range   Hgb A1c MFr Bld 6.0 (H) <5.7 % of total Hgb   Mean Plasma Glucose 126 (calc)   eAG (mmol/L) 7.0 (calc)  COMPLETE METABOLIC PANEL WITH GFR  Result Value Ref Range   Glucose, Bld 100 (H) 65 - 99 mg/dL   BUN 15 7 - 25 mg/dL   Creat 1.01 0.70 - 1.18 mg/dL   GFR, Est Non African American 72 > OR = 60 mL/min/1.39m2   GFR, Est African American 84 > OR = 60 mL/min/1.93m2   BUN/Creatinine Ratio NOT APPLICABLE 6 - 22 (calc)   Sodium 143 135 - 146 mmol/L   Potassium 4.1 3.5 - 5.3 mmol/L   Chloride 106 98 - 110 mmol/L   CO2 31 20 - 32 mmol/L   Calcium 9.4 8.6 - 10.3 mg/dL   Total Protein 6.6 6.1 - 8.1 g/dL   Albumin 4.1 3.6 - 5.1 g/dL   Globulin 2.5 1.9 - 3.7 g/dL (calc)   AG Ratio 1.6 1.0 - 2.5 (calc)   Total Bilirubin 0.4 0.2 - 1.2 mg/dL   Alkaline phosphatase (APISO) 121 (H) 40 - 115 U/L   AST 17 10 - 35 U/L   ALT 23 9 - 46 U/L      Assessment & Plan:   Problem List Items Addressed This Visit    Essential hypertension     Other Visit Diagnoses    Acute non-recurrent maxillary sinusitis    -  Primary   Relevant Medications   ipratropium (ATROVENT) 0.06 % nasal spray   amoxicillin-clavulanate (AUGMENTIN) 875-125 MG tablet  Consistent with new episode acute maxillary sinusitis, likely initially viral URI w/ sick contacts  vs allergic rhinitis component with worsening concern for bacterial infection now with persistent symptoms similar to prior illness. Seems may be catching it slightly earlier now. - No focal sign of pulmonary infection - Previously had similar sinusitis about 4-5 months ago. Has seen [facility] they did not recommend other treatment testing yet, future maybe Sinus CT if continued recurrent, he has not adhered to Astelin anti histamine spray  Also elevated BP up to 160-180 by report. Improved here today.  Plan: 1. Start Augmentin 875-125mg  PO BID x 10 days - Tylenol 1g TID 2. Resume Loratadine (Claritin) 10mg  daily  3. Start Atrovent nasal spray decongestant 2 sprays in each nostril up to 4 times daily for 7 days - Hold afrin may use rarely avoid rebound - Hold offering Tessalon, did not help him last time - Improve hydration, may use OTC meds Return criteria reviewed - if not improving again -reconsider ENT. May retry Flonase again as well.  Additionally regarding HTN elevated BP readings, most likely secondary to current sinus infection respiratory illness symptoms. He is not taking OTC that would raise BP. Cautioned on this. Reviewed meds. Recommend now that BP improving continue to monitor, no changes today. Reviewed return criteria when to seek more immediate medical attention if HTN urgency or severe symptoms.  Meds ordered this encounter  Medications  . ipratropium (ATROVENT) 0.06 % nasal spray    Sig: Place 2 sprays into both nostrils 4 (four) times daily. For up to 5-7 days then stop.    Dispense:  15 mL    Refill:  0  . amoxicillin-clavulanate (AUGMENTIN) 875-125 MG  tablet    Sig: Take 1 tablet by mouth 2 (two) times daily. For 10 days    Dispense:  20 tablet    Refill:  0    Follow up plan: Return in about 1 week (around 02/06/2018), or if symptoms worsen or fail to improve, for sinusitis sooner if needed, as scheduled in October 2019.  Nobie Putnam, [facility] 01/30/2018, 1:30 PM

## 2018-02-13 ENCOUNTER — Ambulatory Visit: Payer: PPO | Admitting: Cardiovascular Disease

## 2018-02-14 ENCOUNTER — Other Ambulatory Visit: Payer: Medicare HMO

## 2018-02-14 DIAGNOSIS — E782 Mixed hyperlipidemia: Secondary | ICD-10-CM | POA: Diagnosis not present

## 2018-02-14 DIAGNOSIS — R7303 Prediabetes: Secondary | ICD-10-CM | POA: Diagnosis not present

## 2018-02-14 DIAGNOSIS — I10 Essential (primary) hypertension: Secondary | ICD-10-CM | POA: Diagnosis not present

## 2018-02-15 ENCOUNTER — Ambulatory Visit: Payer: Medicare HMO | Admitting: Cardiovascular Disease

## 2018-02-15 ENCOUNTER — Encounter: Payer: Self-pay | Admitting: Cardiovascular Disease

## 2018-02-15 VITALS — BP 162/72 | HR 62 | Ht 68.0 in | Wt 172.0 lb

## 2018-02-15 DIAGNOSIS — I10 Essential (primary) hypertension: Secondary | ICD-10-CM | POA: Diagnosis not present

## 2018-02-15 DIAGNOSIS — I493 Ventricular premature depolarization: Secondary | ICD-10-CM

## 2018-02-15 LAB — LIPID PANEL
CHOL/HDL RATIO: 4.3 (calc) (ref <5.0)
Cholesterol: 143 mg/dL (ref <200)
HDL: 33 mg/dL — ABNORMAL LOW (ref >40)
LDL CHOLESTEROL (CALC): 77 mg/dL
NON-HDL CHOLESTEROL (CALC): 110 mg/dL (ref <130)
TRIGLYCERIDES: 238 mg/dL — AB (ref <150)

## 2018-02-15 LAB — HEMOGLOBIN A1C
Hgb A1c MFr Bld: 5.7 % of total Hgb — ABNORMAL HIGH (ref <5.7)
Mean Plasma Glucose: 117 (calc)
eAG (mmol/L): 6.5 (calc)

## 2018-02-15 LAB — BASIC METABOLIC PANEL WITH GFR
BUN: 15 mg/dL (ref 7–25)
CALCIUM: 9 mg/dL (ref 8.6–10.3)
CHLORIDE: 104 mmol/L (ref 98–110)
CO2: 30 mmol/L (ref 20–32)
CREATININE: 0.88 mg/dL (ref 0.70–1.18)
GFR, EST AFRICAN AMERICAN: 97 mL/min/{1.73_m2} (ref >=60)
GFR, EST NON AFRICAN AMERICAN: 83 mL/min/{1.73_m2} (ref >=60)
Glucose, Bld: 100 mg/dL — ABNORMAL HIGH (ref 65–99)
Potassium: 3.8 mmol/L (ref 3.5–5.3)
Sodium: 141 mmol/L (ref 135–146)

## 2018-02-15 MED ORDER — HYDROCHLOROTHIAZIDE 25 MG PO TABS: 25.0000 mg | ORAL_TABLET | Freq: Every day | ORAL | 3 refills | Status: DC

## 2018-02-15 MED ORDER — DILTIAZEM HCL ER COATED BEADS 240 MG PO CP24: 240.0000 mg | ORAL_CAPSULE | Freq: Every morning | ORAL | 3 refills | Status: DC

## 2018-02-15 NOTE — Patient Instructions (Addendum)
Medication Instructions:  Your physician has recommended you make the following change in your medication:  1. INCREASE Hydrochlorothiazide to 25 mg once daily  If you need a refill on your cardiac medications before your next appointment, please call your pharmacy.   Lab work: None at this time If you have labs (blood work) drawn today and your tests are completely normal, you will receive your results only by: Marland Kitchen MyChart Message (if you have MyChart) OR . A paper copy in the mail If you have any lab test that is abnormal or we need to change your treatment, we will call you to review the results.  Testing/Procedures: Your physician has requested that you have a renal artery duplex. During this test, an ultrasound is used to evaluate blood flow to the kidneys. Allow one hour for this exam. Do not eat after midnight the day before and avoid carbonated beverages. Take your medications as you usually do.    Follow-Up: At [facility], you and your health needs are our priority.  As part of our continuing mission to provide you with exceptional heart care, we have created designated Provider Care Teams.  These Care Teams include your primary Cardiologist (physician) and Advanced Practice Providers (APPs -  Physician Assistants and Nurse Practitioners) who all work together to provide you with the care you need, when you need it. You will need a follow up appointment in 12 months.  Please call our office 2 months in advance to schedule this appointment.  You may see Dr. Fletcher Anon or one of the following Advanced Practice Providers on your designated Care Team:   Murray Hodgkins, NP Christell Faith, PA-C . Marrianne Mood, PA-C

## 2018-02-15 NOTE — Progress Notes (Signed)
Cardiology Office Note   Date:  02/15/2018   ID:  Kyle Kung., DOB April 25, 1941, MRN [id number]  PCP:  [name], DO  Cardiologist:   Kathlyn Sacramento, MD   Chief Complaint  Patient presents with  . other    12 follow up. Medication reviewed verbally.       History of Present Illness: Kyle Mcbryar. is a 77 y.o. male who presents for a followup visit regarding palpitations and PVCs. The patient has prolonged history of palpitations thought to be due to PVCs. Negative stress test in the past.  Echocardiogram in 02/14 showed normal LV systolic function without significant valvular abnormalities. There was mild left ventricular hypertrophy with mild diastolic dysfunction. Holter monitor showed excessive monomorphic PVCs of RVOT origin. He has been treated with diltiazem and Metoprolol with excellent control of symptoms. He continues to be very active and enjoys babysitting his grandkids. His blood pressure has been elevated lately.  He brought his blood pressure readings with him.  His systolic blood pressure is universally above 140 mmHg and goes as high as 174 mmHg.  This is in spite of for antihypertensive medications.  He denies chest pain or shortness of breath.    Past Medical History:  Diagnosis Date  . Allergic rhinitis, cause unspecified   . Diarrhea   . Dysrhythmia   . Esophageal reflux   . H/O diverticulitis of colon   . History of kidney stones   . Hypertension   . Other and unspecified hyperlipidemia   . PVC's (premature ventricular contractions)    Right ventricular outflow tract    Past Surgical History:  Procedure Laterality Date  . BASAL CELL CARCINOMA EXCISION    . COLONOSCOPY    . COLONOSCOPY WITH PROPOFOL N/A 02/08/2017   Procedure: COLONOSCOPY WITH PROPOFOL;  Surgeon: Manya Silvas, MD;  Location: [facility] ENDOSCOPY;  Service: Endoscopy;  Laterality: N/A;  . REPAIR IMPERFORATE ANUS / ANORECTOPLASTY    . TRANSURETHRAL RESECTION  OF PROSTATE       Current Outpatient Medications  Medication Sig Dispense Refill  . amoxicillin-clavulanate (AUGMENTIN) 875-125 MG tablet Take 1 tablet by mouth 2 (two) times daily. For 10 days 20 tablet 0  . aspirin 81 MG tablet Take 81 mg by mouth daily.    . Blood Pressure Monitoring (BLOOD PRESSURE CUFF) MISC 1 Device by Does not apply route daily. Normal upper arm BP cuff 1 each 0  . diltiazem (CARDIZEM CD) 240 MG 24 hr capsule Take 1 capsule (240 mg total) by mouth every morning. 90 capsule 3  . Garlic 10 MG CAPS Take by mouth. Unsure of dose    . glucose blood test strip ONETOUCH ULTRA BLUE (In Vitro Strip)  1 (one) Strip Strip check blood sugar once daily for 0 days  Quantity: 50;  Refills: 12   Ordered :02-Oct-2013  [name];  Started 03-Jul-2013 Active    . glucose blood test strip Use as instructed 100 each 12  . ipratropium (ATROVENT) 0.06 % nasal spray Place 2 sprays into both nostrils 4 (four) times daily. For up to 5-7 days then stop. 15 mL 0  . loratadine (CLARITIN) 10 MG tablet Take 10 mg by mouth daily.    Marland Kitchen losartan (COZAAR) 100 MG tablet Take 1 tablet (100 mg total) by mouth daily. 90 tablet 3  . metoprolol tartrate (LOPRESSOR) 25 MG tablet Take 1.5 tabs twice daily 270 tablet 3  . NON FORMULARY Take 1 tablet  by mouth daily. Cholestoff 2 tablets daily.    Marland Kitchen omega-3 acid ethyl esters (LOVAZA) 1 g capsule Take by mouth.    . Sodium Chloride-Sodium Bicarb (AYR SALINE NASAL NETI RINSE) 1.57 G PACK Place into the nose as needed.      No current facility-administered medications for this visit.     Allergies:   Dust mite extract and Other    Social History:  The patient  reports that he has never smoked. He has never used smokeless tobacco. He reports that he drinks alcohol. He reports that he does not use drugs.   Family History:  The patient's family history includes Hypertension in his father.    ROS:  Please see the history of present illness.   Otherwise,  review of systems are positive for none.   All other systems are reviewed and negative.    PHYSICAL EXAM: VS:  BP (!) 162/72 (BP Location: Left Arm, Patient Position: Sitting, Cuff Size: Normal)   Pulse 62   Ht 5\' 8"  (1.727 m)   Wt 172 lb (78 kg)   BMI 26.15 kg/m  , BMI Body mass index is 26.15 kg/m. GEN: Well nourished, well developed, in no acute distress  HEENT: normal  Neck: no JVD, carotid bruits, or masses Cardiac: RRR; no murmurs, rubs, or gallops,no edema  Respiratory:  clear to auscultation bilaterally, normal work of breathing GI: soft, nontender, nondistended, + BS MS: no deformity or atrophy  Skin: warm and dry, no rash Neuro:  Strength and sensation are intact Psych: euthymic mood, full affect   EKG:  EKG is ordered today. The ekg ordered today demonstrates normal sinus rhythm with right bundle branch block.   Recent Labs: 08/15/2017: ALT 23; Hemoglobin 14.4; Platelets 233 02/14/2018: BUN 15; Creat 0.88; Potassium 3.8; Sodium 141    Lipid Panel    Component Value Date/Time   CHOL 143 02/14/2018 0759   TRIG 238 (H) 02/14/2018 0759   HDL 33 (L) 02/14/2018 0759   CHOLHDL 4.3 02/14/2018 0759   VLDL 45 (H) 07/06/2016 0001   LDLCALC 77 02/14/2018 0759      Wt Readings from Last 3 Encounters:  02/15/18 172 lb (78 kg)  01/30/18 172 lb (78 kg)  12/19/17 171 lb 6.4 oz (77.7 kg)       No flowsheet data found.    ASSESSMENT AND PLAN:  1.  Symptomatic PVCs: Well controlled on current dose of diltiazem and metoprolol with no side effects. Normal ejection fraction. Continue same medications.  2. Essential hypertension: Blood pressure is not well controlled in spite of 4 antihypertensive medications.  I reviewed his recent labs which showed normal kidney function and electrolytes.  I elected to increase hydrochlorothiazide to 25 mg once daily. Given that his blood pressure is not controlled on multiple antihypertensive medications, I requested renal artery  duplex to exclude renal artery stenosis as a culprit considering his age.   Disposition:   FU with me in 1 year  Signed,  Kathlyn Sacramento, MD  02/15/2018 3:56 PM    [place]

## 2018-02-16 ENCOUNTER — Telehealth: Payer: Self-pay | Admitting: Cardiovascular Disease

## 2018-02-16 DIAGNOSIS — R69 Illness, unspecified: Secondary | ICD-10-CM | POA: Diagnosis not present

## 2018-02-16 NOTE — Telephone Encounter (Signed)
Spoke with pt and explained why Dr. Fletcher Anon wanted pt to have renal US.  Pt verbalized understanding and was appreciative for call.

## 2018-02-16 NOTE — Telephone Encounter (Signed)
Please call to discuss why pt needs renal ultrasound

## 2018-02-20 ENCOUNTER — Ambulatory Visit (INDEPENDENT_AMBULATORY_CARE_PROVIDER_SITE_OTHER): Payer: Medicare HMO

## 2018-02-20 DIAGNOSIS — I10 Essential (primary) hypertension: Secondary | ICD-10-CM | POA: Diagnosis not present

## 2018-02-21 ENCOUNTER — Encounter: Payer: Self-pay | Admitting: Family Medicine

## 2018-02-21 ENCOUNTER — Ambulatory Visit (INDEPENDENT_AMBULATORY_CARE_PROVIDER_SITE_OTHER): Payer: Medicare HMO | Admitting: Family Medicine

## 2018-02-21 ENCOUNTER — Other Ambulatory Visit: Payer: Self-pay | Admitting: Family Medicine

## 2018-02-21 VITALS — BP 136/72 | HR 72 | Temp 98.5°F | Resp 16 | Ht 68.0 in | Wt 171.6 lb

## 2018-02-21 DIAGNOSIS — N138 Other obstructive and reflux uropathy: Secondary | ICD-10-CM

## 2018-02-21 DIAGNOSIS — I493 Ventricular premature depolarization: Secondary | ICD-10-CM

## 2018-02-21 DIAGNOSIS — E782 Mixed hyperlipidemia: Secondary | ICD-10-CM

## 2018-02-21 DIAGNOSIS — Z0000 Encounter for general adult medical examination without abnormal findings: Secondary | ICD-10-CM

## 2018-02-21 DIAGNOSIS — I10 Essential (primary) hypertension: Secondary | ICD-10-CM

## 2018-02-21 DIAGNOSIS — R7303 Prediabetes: Secondary | ICD-10-CM | POA: Diagnosis not present

## 2018-02-21 DIAGNOSIS — N401 Enlarged prostate with lower urinary tract symptoms: Secondary | ICD-10-CM

## 2018-02-21 MED ORDER — OMEGA-3 FISH OIL 1200 MG PO CAPS: 1200.0000 mg | ORAL_CAPSULE | Freq: Three times a day (TID) | ORAL | 0 refills | Status: AC

## 2018-02-21 NOTE — Assessment & Plan Note (Signed)
Followed by Cardiology [facility] On Diltiazem, BB Metoprolol

## 2018-02-21 NOTE — Assessment & Plan Note (Signed)
Mild elevated BP and home readings, somewhat improved after past 1 week on higher dose HCTZ - Home BP readings wide range sub-optimal control No known complications except history of PVCs Followed by Cardiology   Plan:  1. No change today since thiazide was just increased 1 week ago - Continue current BP regimen - Losartan 100mg , HCTZ 25mg , Metoprolol 37.5mg  BID (1.5 tabs of 25mg ) 2. Encourage improved lifestyle - low sodium diet, continue regular exercise 3. Continue monitor BP outside office, bring readings to next visit, if persistently >140/90 or new symptoms notify office sooner 4. Follow-up 6 months with labs / annual - may reconsider inc dose Metoprolol if needed for additional BP control

## 2018-02-21 NOTE — Assessment & Plan Note (Signed)
Significantly improved HyperTG now down from 300+ down to 200s Controlled HDL, LDL, not on statin Last lipid 02/2018 Calculated ASCVD 10 yr risk score 28.8%, elevated  Plan: 1. Discussion on ASCVD risk - he understands risks and prefers to avoid new meds such as Statin, we agree emphasis on lifestyle to help improve his cholesterol and reduce risk. - INCREASE Fish Oil Omega 3 from 1200 BID to TID up to 3600 daily, and continue other OTC herbal supplement garlique 2. Continue ASA 81mg  for primary ASCVD risk reduction 3. Encourage improved lifestyle - low carb/cholesterol, reduce portion size, continue improving regular exercise 4. Follow-up 6 months annual + lipids fasting

## 2018-02-21 NOTE — Patient Instructions (Addendum)
Thank you for coming to the office today.  BP improved on re-check, keep track of it. Notify if consistently >150/90  Try to reduce sodium in diet, stay active, reduce caffeine and improve hydration.  INCREASE Fish Oil to 1200 3 times a day  Continue other medications  Sugar improved A1c to 5.7  DUE for FASTING BLOOD WORK (no food or drink after midnight before the lab appointment, only water or coffee without cream/sugar on the morning of)  SCHEDULE "Lab Only" visit in the morning at the clinic for lab draw in 6 MONTHS   - Make sure Lab Only appointment is at about 1 week before your next appointment, so that results will be available  For Lab Results, once available within 2-3 days of blood draw, you can can log in to MyChart online to view your results and a brief explanation. Also, we can discuss results at next follow-up visit.   Please schedule a Follow-up Appointment to: Return in about 6 months (around 08/23/2018) for Annual Physical.  If you have any other questions or concerns, please feel free to call the office or send a message through [place]. You may also schedule an earlier appointment if necessary.  Additionally, you may be receiving a survey about your experience at our office within a few days to 1 week by e-mail or mail. We value your feedback.  Nobie Putnam, DO [place]

## 2018-02-21 NOTE — Progress Notes (Signed)
Subjective:    Patient ID: Kyle Vargas., male    DOB: [birth year], 77 y.o.   MRN: [id number]  Kyle Vargas [place] Kyle Vargas. is a 77 y.o. male presenting on 02/21/2018 for Hypertension   HPI  Pre-Diabetes Recent lab shows A1c improved from 6 down to 5.7 on lab. He attributes improved wt loss on diet CBGs: Avg 100-120, Low none, High none. Checks CBGs x 2-3weeks Meds: Never Currently on ARB Lifestyle: - Diet (improved low carb, still eating salt / caffeine) - Exercise (walks 30 min daily 5 x weekly, works in garden, yard, active at home) Denies hypoglycemia, vision change, polyuria  CHRONIC HTN / Intermittent PVC - controlled. Last seen [facility] Cardiology Dr Fletcher Anon 02/15/18, and due to elevated BP, his HCTZ was increased from 12.5 up to 25mg  daily, other meds continued. Also they checked Renal Artery [place] due to spikes or elevation in BP, results show normal without stenosis. - He admits still eating some poor options with higher salt and caffeine intake, stays active exercise - Home BP log shows mixed readings, some improved readings at times SBP 130-140, others up to 150-160, otherwise DBP controlled Current Meds - Losartan 100mg , HCTZ 25mg , Metoprolol 37.5mg  twice daily (25mg  tabs, 1.5 per dose), Diltiazem-24 hour 240mg  Reports good compliance, took meds today. Tolerating well, w/o complaints. - Taking ASA 81mg  x 5 days weekly, skips weekends. No known CAD or CVA - Followed by Dr Fletcher Anon Cardiology for PVC in past, controlled on medicines Denies CP, dyspnea, HA, edema, dizziness / lightheadedness  HYPERLIPIDEMIA: - Reports concerns with chronic elevated TG for reportedly >20 years and fam history of same. Last lipid panel 02/2018 with improved TG, and controlled LDL and HDL - Currently taking Fish Oil Omega 3, 1200 - now taking x 2 a day, asking about increasing dose up - he has not been on Statin or Fibrate in past by his report - he has started [place]  Maintenance: Updated Flu Vaccine today, received at [facility], on 02/07/18.  Depression screen [facility] 2/9 02/21/2018 01/30/2018 12/19/2017  Decreased Interest 0 0 0  Down, Depressed, Hopeless 0 0 0  PHQ - 2 Score 0 0 0  Altered sleeping - - -  Tired, decreased energy - - -  Change in appetite - - -  Feeling bad or failure about yourself  - - -  Trouble concentrating - - -  Suicidal thoughts - - -  PHQ-9 Score - - -  Difficult doing work/chores - - -    Social History   Tobacco Use  . Smoking status: Never Smoker  . Smokeless tobacco: Never Used  Substance Use Topics  . Alcohol use: Yes    Comment: occasionally  2 glasses of wine a month   . Drug use: No    Review of Systems Per HPI unless specifically indicated above     Objective:    BP 136/72 (BP Location: Left Arm, Cuff Size: Normal)   Pulse 72   Temp 98.5 F (36.9 C) (Oral)   Resp 16   Ht 5\' 8"  (1.727 m)   Wt 171 lb 9.6 oz (77.8 kg)   BMI 26.09 kg/m   Wt Readings from Last 3 Encounters:  02/21/18 171 lb 9.6 oz (77.8 kg)  02/15/18 172 lb (78 kg)  01/30/18 172 lb (78 kg)    Physical Exam  Constitutional: He is oriented to person, place, and time. He appears well-developed and well-nourished. No distress.  Well-appearing, comfortable, cooperative  HENT:  Head: Normocephalic and atraumatic.  Mouth/Throat: Oropharynx is clear and moist.  Eyes: Conjunctivae are normal. Right eye exhibits no discharge. Left eye exhibits no discharge.  Neck: Normal range of motion. Neck supple. No thyromegaly present.  Cardiovascular: Normal rate, regular rhythm, normal heart sounds and intact distal pulses.  No murmur heard. Pulmonary/Chest: Effort normal and breath sounds normal. No respiratory distress. He has no wheezes. He has no rales.  Musculoskeletal: Normal range of motion. He exhibits no edema.  Lymphadenopathy:    He has no cervical adenopathy.  Neurological: He is alert and oriented to person, place, and time.  Skin:  Skin is warm and dry. No rash noted. He is not diaphoretic. No erythema.  Psychiatric: He has a normal mood and affect. His behavior is normal.  Well groomed, good eye contact, normal speech and thoughts  Nursing note and vitals reviewed.     I have personally reviewed the radiology report from 02/20/18 on Vascular US Renal Artery.  ABDOMINAL VISCERAL  Indications: Resistant hypertension  High Risk Factors: Hypertension, hyperlipidemia, no history of smoking.  Other Factors: Patient denies abdominal or back pain. Normal labs on 02/14/18.        Last blood pressure was 162/72 mmHg        The patient reported having a long history of numerous kidney        stones.  Limitations: Air/bowel gas.  Comparison Study: No study on record for comparison  Performing Technologist: Pilar Jarvis RDMS, RVT, RDCS   Examination Guidelines: A complete evaluation includes B-mode imaging, spectral Doppler, color Doppler, and power Doppler as needed of all accessible portions of each vessel. Bilateral testing is considered an integral part of a complete examination. Limited examinations for reoccurring indications may be performed as noted.   Duplex Findings: +--------------------+--------+--------+------+--------+ Mesenteric     PSV cm/sEDV cm/sPlaqueComments +--------------------+--------+--------+------+--------+ Aorta at SMA      95    11          +--------------------+--------+--------+------+--------+ Aorta Mid       110              +--------------------+--------+--------+------+--------+ Aorta Distal     131              +--------------------+--------+--------+------+--------+ Celiac Artery Origin  82    11          +--------------------+--------+--------+------+--------+ SMA Proximal      86    12           +--------------------+--------+--------+------+--------+   +------------------+--------+--------+-------+ Right Renal ArteryPSV cm/sEDV cm/sComment +------------------+--------+--------+-------+ Origin        76    14       +------------------+--------+--------+-------+ Proximal       91    11       +------------------+--------+--------+-------+ Mid          52    11       +------------------+--------+--------+-------+ Distal        56    15       +------------------+--------+--------+-------+  +-----------------+--------+--------+-------+ Left Renal ArteryPSV cm/sEDV cm/sComment +-----------------+--------+--------+-------+ Origin       137    28       +-----------------+--------+--------+-------+ Proximal      142    19       +-----------------+--------+--------+-------+ Mid        141    25       +-----------------+--------+--------+-------+ Distal       105    24       +-----------------+--------+--------+-------+  Technologist observations Renal Artery(s):Diffusely increased echogenicity of the bilateral kidneys, with ill-defined borders. +------------+--------+--------+----+-----------+--------+--------+----+ Right KidneyPSV cm/sEDV cm/sRI Left KidneyPSV cm/sEDV cm/sRI  +------------+--------+--------+----+-----------+--------+--------+----+ Upper Pole 27   6    0.77Upper Pole 27   7    0.72 +------------+--------+--------+----+-----------+--------+--------+----+ Mid     31   8    0.74Mid    19   6    0.66 +------------+--------+--------+----+-----------+--------+--------+----+ Lower Pole 19   5    0.68Lower Pole 24   7    0.72 +------------+--------+--------+----+-----------+--------+--------+----+ Hilar    34   10    0.71Hilar   30   7    0.76 +------------+--------+--------+----+-----------+--------+--------+----+  +------------------+--------+------------------+--------+ Right Kidney       Left Kidney         +------------------+--------+------------------+--------+ RAR            RAR             +------------------+--------+------------------+--------+ RAR (manual)   0.96  RAR (manual)   1.5    +------------------+--------+------------------+--------+ Cortex          Cortex           +------------------+--------+------------------+--------+ Cortex thickness 10.00 mmCorex thickness  10.00 mm +------------------+--------+------------------+--------+ Kidney length (cm)12.41  Kidney length (cm)13.65   +------------------+--------+------------------+--------+   Summary: Largest Aortic Diameter: 2.0 cm  Renal:  Right: No evidence of right renal artery stenosis. Normal size right    kidney. Normal right Resisitive Index. Normal cortical    thickness of right kidney. Left: Normal size of left kidney. Normal left Resistive Index.    Normal cortical thickness of the left kidney. No evidence of    left renal artery stenosis. Mesenteric: Normal Celiac artery and Superior Mesenteric artery findings.  *See table(s) above for measurements and observations.  Suggest follow up study. Consider dedicated CT.  Diagnosing physician: Quay Burow MD  Electronically signed by Quay Burow MD on 02/20/2018 at 3:43:49 PM.  Recent Labs    08/15/17 0802 02/14/18 0759  HGBA1C 6.0* 5.7*    Results for orders placed or performed in visit on 65/46/50  BASIC METABOLIC PANEL WITH GFR  Result Value Ref Range   Glucose, Bld 100 (H) 65 - 99 mg/dL   BUN 15 7 - 25 mg/dL   Creat 0.88 0.70 - 1.18 mg/dL   GFR, Est Non African American 83 > OR = 60 mL/min/1.27m2   GFR, Est  African American 97 > OR = 60 mL/min/1.73m2   BUN/Creatinine Ratio NOT APPLICABLE 6 - 22 (calc)   Sodium 141 135 - 146 mmol/L   Potassium 3.8 3.5 - 5.3 mmol/L   Chloride 104 98 - 110 mmol/L   CO2 30 20 - 32 mmol/L   Calcium 9.0 8.6 - 10.3 mg/dL  Hemoglobin A1c  Result Value Ref Range   Hgb A1c MFr Bld 5.7 (H) <5.7 % of total Hgb   Mean Plasma Glucose 117 (calc)   eAG (mmol/L) 6.5 (calc)  Lipid panel  Result Value Ref Range   Cholesterol 143 <200 mg/dL   HDL 33 (L) >40 mg/dL   Triglycerides 238 (H) <150 mg/dL   LDL Cholesterol (Calc) 77 mg/dL (calc)   Total CHOL/HDL Ratio 4.3 <5.0 (calc)   Non-HDL Cholesterol (Calc) 110 <130 mg/dL (calc)      Assessment & Plan:   Problem List Items Addressed This Visit    Essential hypertension    Mild elevated BP and home readings, somewhat improved after past 1 week on higher dose HCTZ - Home BP readings wide range  sub-optimal control No known complications except history of PVCs Followed by Cardiology   Plan:  1. No change today since thiazide was just increased 1 week ago - Continue current BP regimen - Losartan 100mg , HCTZ 25mg , Metoprolol 37.5mg  BID (1.5 tabs of 25mg ) 2. Encourage improved lifestyle - low sodium diet, continue regular exercise 3. Continue monitor BP outside office, bring readings to next visit, if persistently >140/90 or new symptoms notify office sooner 4. Follow-up 6 months with labs / annual - may reconsider inc dose Metoprolol if needed for additional BP control      Hyperlipidemia - Primary    Significantly improved HyperTG now down from 300+ down to 200s Controlled HDL, LDL, not on statin Last lipid 02/2018 Calculated ASCVD 10 yr risk score 28.8%, elevated  Plan: 1. Discussion on ASCVD risk - he understands risks and prefers to avoid new meds such as Statin, we agree emphasis on lifestyle to help improve his cholesterol and reduce risk. - INCREASE Fish Oil Omega 3 from 1200 BID to TID up to 3600 daily, and  continue other OTC herbal supplement garlique 2. Continue ASA 81mg  for primary ASCVD risk reduction 3. Encourage improved lifestyle - low carb/cholesterol, reduce portion size, continue improving regular exercise 4. Follow-up 6 months annual + lipids fasting      Pre-diabetes    Well-controlled Pre-DM A1c with improvement A1c 6.0 down to 5.7 Concern with HTN, HLD  Plan:  1. Not on any therapy currently - again defer new med today - since improved 2. Encourage improved lifestyle - low carb, low sugar diet, reduce portion size, continue improving regular exercise 3. Follow-up 6 months annual / a1c      PVC's (premature ventricular contractions)    Followed by Cardiology [facility] On Diltiazem, BB Metoprolol         Meds ordered this encounter  Medications  . Omega-3 Fatty Acids (OMEGA-3 FISH OIL) 1200 MG CAPS    Sig: Take 1 capsule (1,200 mg total) by mouth 3 (three) times daily.    Dispense:  90 capsule    Refill:  0    Follow up plan: Return in about 6 months (around 08/23/2018) for Annual Physical.  Future labs ordered for 08/16/18  Nobie Putnam, [facility] 02/21/2018, 10:17 AM

## 2018-02-21 NOTE — Assessment & Plan Note (Signed)
Well-controlled Pre-DM A1c with improvement A1c 6.0 down to 5.7 Concern with HTN, HLD  Plan:  1. Not on any therapy currently - again defer new med today - since improved 2. Encourage improved lifestyle - low carb, low sugar diet, reduce portion size, continue improving regular exercise 3. Follow-up 6 months annual / a1c

## 2018-02-23 ENCOUNTER — Telehealth: Payer: Self-pay | Admitting: *Deleted

## 2018-02-23 NOTE — Telephone Encounter (Signed)
-----   Message from Wellington Hampshire, MD sent at 02/23/2018  2:00 PM EDT ----- no evidence of renal artery stenosis.

## 2018-02-23 NOTE — Telephone Encounter (Signed)
There was no evidence of aortic aneurysm. His aorta was normal in size.

## 2018-02-23 NOTE — Telephone Encounter (Signed)
Patient made aware of results and verbalized understanding.  The patient asked about his abdominal aneurysm. He was under the impression that the Renal Duplex would also look at this as well.

## 2018-02-26 NOTE — Telephone Encounter (Signed)
Patient made aware and verbalized his understanding.  

## 2018-03-15 DIAGNOSIS — D0421 Carcinoma in situ of skin of right ear and external auricular canal: Secondary | ICD-10-CM | POA: Diagnosis not present

## 2018-03-15 DIAGNOSIS — D2272 Melanocytic nevi of left lower limb, including hip: Secondary | ICD-10-CM | POA: Diagnosis not present

## 2018-03-15 DIAGNOSIS — D485 Neoplasm of uncertain behavior of skin: Secondary | ICD-10-CM | POA: Diagnosis not present

## 2018-03-15 DIAGNOSIS — D2271 Melanocytic nevi of right lower limb, including hip: Secondary | ICD-10-CM | POA: Diagnosis not present

## 2018-03-15 DIAGNOSIS — D2261 Melanocytic nevi of right upper limb, including shoulder: Secondary | ICD-10-CM | POA: Diagnosis not present

## 2018-03-15 DIAGNOSIS — L570 Actinic keratosis: Secondary | ICD-10-CM | POA: Diagnosis not present

## 2018-03-15 DIAGNOSIS — D2262 Melanocytic nevi of left upper limb, including shoulder: Secondary | ICD-10-CM | POA: Diagnosis not present

## 2018-03-15 DIAGNOSIS — L821 Other seborrheic keratosis: Secondary | ICD-10-CM | POA: Diagnosis not present

## 2018-03-15 DIAGNOSIS — Z08 Encounter for follow-up examination after completed treatment for malignant neoplasm: Secondary | ICD-10-CM | POA: Diagnosis not present

## 2018-03-15 DIAGNOSIS — X32XXXA Exposure to sunlight, initial encounter: Secondary | ICD-10-CM | POA: Diagnosis not present

## 2018-03-15 DIAGNOSIS — Z85828 Personal history of other malignant neoplasm of skin: Secondary | ICD-10-CM | POA: Diagnosis not present

## 2018-05-09 HISTORY — PX: CATARACT EXTRACTION, BILATERAL: SHX1313

## 2018-05-10 ENCOUNTER — Other Ambulatory Visit: Payer: Self-pay | Admitting: Family Medicine

## 2018-05-10 DIAGNOSIS — I10 Essential (primary) hypertension: Secondary | ICD-10-CM

## 2018-05-29 DIAGNOSIS — L570 Actinic keratosis: Secondary | ICD-10-CM | POA: Diagnosis not present

## 2018-05-29 DIAGNOSIS — L905 Scar conditions and fibrosis of skin: Secondary | ICD-10-CM | POA: Diagnosis not present

## 2018-05-29 DIAGNOSIS — D0421 Carcinoma in situ of skin of right ear and external auricular canal: Secondary | ICD-10-CM | POA: Diagnosis not present

## 2018-05-29 DIAGNOSIS — L578 Other skin changes due to chronic exposure to nonionizing radiation: Secondary | ICD-10-CM | POA: Diagnosis not present

## 2018-05-29 DIAGNOSIS — L814 Other melanin hyperpigmentation: Secondary | ICD-10-CM | POA: Diagnosis not present

## 2018-07-18 ENCOUNTER — Other Ambulatory Visit: Payer: Self-pay | Admitting: Nurse Practitioner

## 2018-07-18 DIAGNOSIS — I10 Essential (primary) hypertension: Secondary | ICD-10-CM

## 2018-07-18 DIAGNOSIS — I493 Ventricular premature depolarization: Secondary | ICD-10-CM

## 2018-07-19 ENCOUNTER — Other Ambulatory Visit: Payer: Self-pay | Admitting: Family Medicine

## 2018-07-23 DIAGNOSIS — R3916 Straining to void: Secondary | ICD-10-CM | POA: Diagnosis not present

## 2018-07-23 LAB — PSA: PSA: 2.1

## 2018-08-16 ENCOUNTER — Other Ambulatory Visit: Payer: Medicare HMO

## 2018-08-23 ENCOUNTER — Encounter: Payer: Medicare HMO | Admitting: Family Medicine

## 2018-10-10 DIAGNOSIS — R7303 Prediabetes: Secondary | ICD-10-CM | POA: Diagnosis not present

## 2018-10-10 DIAGNOSIS — I10 Essential (primary) hypertension: Secondary | ICD-10-CM | POA: Diagnosis not present

## 2018-10-10 DIAGNOSIS — I493 Ventricular premature depolarization: Secondary | ICD-10-CM | POA: Diagnosis not present

## 2018-10-10 DIAGNOSIS — Z0000 Encounter for general adult medical examination without abnormal findings: Secondary | ICD-10-CM | POA: Diagnosis not present

## 2018-10-10 DIAGNOSIS — E782 Mixed hyperlipidemia: Secondary | ICD-10-CM | POA: Diagnosis not present

## 2018-10-11 ENCOUNTER — Other Ambulatory Visit: Payer: Self-pay

## 2018-10-11 ENCOUNTER — Other Ambulatory Visit: Payer: Medicare Other

## 2018-10-11 DIAGNOSIS — Z0000 Encounter for general adult medical examination without abnormal findings: Secondary | ICD-10-CM

## 2018-10-11 DIAGNOSIS — R7303 Prediabetes: Secondary | ICD-10-CM

## 2018-10-11 DIAGNOSIS — I493 Ventricular premature depolarization: Secondary | ICD-10-CM

## 2018-10-11 DIAGNOSIS — I10 Essential (primary) hypertension: Secondary | ICD-10-CM

## 2018-10-11 DIAGNOSIS — E782 Mixed hyperlipidemia: Secondary | ICD-10-CM

## 2018-10-12 LAB — COMPLETE METABOLIC PANEL WITH GFR
AG Ratio: 1.5 (calc) (ref 1.0–2.5)
ALT: 39 U/L (ref 9–46)
AST: 26 U/L (ref 10–35)
Albumin: 4.1 g/dL (ref 3.6–5.1)
Alkaline phosphatase (APISO): 93 U/L (ref 35–144)
BUN: 17 mg/dL (ref 7–25)
CO2: 25 mmol/L (ref 20–32)
Calcium: 9 mg/dL (ref 8.6–10.3)
Chloride: 104 mmol/L (ref 98–110)
Creat: 1.07 mg/dL (ref 0.70–1.18)
GFR, Est African American: 78 mL/min/{1.73_m2} (ref >=60)
GFR, Est Non African American: 67 mL/min/{1.73_m2} (ref >=60)
Globulin: 2.7 g/dL (calc) (ref 1.9–3.7)
Glucose, Bld: 108 mg/dL — ABNORMAL HIGH (ref 65–99)
Potassium: 3.7 mmol/L (ref 3.5–5.3)
Sodium: 140 mmol/L (ref 135–146)
Total Bilirubin: 0.5 mg/dL (ref 0.2–1.2)
Total Protein: 6.8 g/dL (ref 6.1–8.1)

## 2018-10-12 LAB — CBC WITH DIFFERENTIAL/PLATELET
Absolute Monocytes: 640 cells/uL (ref 200–950)
Basophils Absolute: 53 cells/uL (ref 0–200)
Basophils Relative: 0.8 %
Eosinophils Absolute: 251 cells/uL (ref 15–500)
Eosinophils Relative: 3.8 %
HCT: 43 % (ref 38.5–50.0)
Hemoglobin: 14.7 g/dL (ref 13.2–17.1)
Lymphs Abs: 1604 cells/uL (ref 850–3900)
MCH: 31.9 pg (ref 27.0–33.0)
MCHC: 34.2 g/dL (ref 32.0–36.0)
MCV: 93.3 fL (ref 80.0–100.0)
MPV: 9.8 fL (ref 7.5–12.5)
Monocytes Relative: 9.7 %
Neutro Abs: 4052 cells/uL (ref 1500–7800)
Neutrophils Relative %: 61.4 %
Platelets: 229 10*3/uL (ref 140–400)
RBC: 4.61 10*6/uL (ref 4.20–5.80)
RDW: 13.7 % (ref 11.0–15.0)
Total Lymphocyte: 24.3 %
WBC: 6.6 10*3/uL (ref 3.8–10.8)

## 2018-10-12 LAB — LIPID PANEL
Cholesterol: 174 mg/dL (ref <200)
HDL: 36 mg/dL — ABNORMAL LOW (ref >=40)
LDL Cholesterol (Calc): 99 mg/dL (calc)
Non-HDL Cholesterol (Calc): 138 mg/dL (calc) — ABNORMAL HIGH (ref <130)
Total CHOL/HDL Ratio: 4.8 (calc) (ref <5.0)
Triglycerides: 294 mg/dL — ABNORMAL HIGH (ref <150)

## 2018-10-12 LAB — HEMOGLOBIN A1C
Hgb A1c MFr Bld: 5.9 % of total Hgb — ABNORMAL HIGH (ref <5.7)
Mean Plasma Glucose: 123 (calc)
eAG (mmol/L): 6.8 (calc)

## 2018-10-18 ENCOUNTER — Encounter: Payer: Self-pay | Admitting: Family Medicine

## 2018-10-18 ENCOUNTER — Ambulatory Visit (INDEPENDENT_AMBULATORY_CARE_PROVIDER_SITE_OTHER): Payer: Medicare Other | Admitting: Family Medicine

## 2018-10-18 ENCOUNTER — Other Ambulatory Visit: Payer: Self-pay

## 2018-10-18 ENCOUNTER — Other Ambulatory Visit: Payer: Self-pay | Admitting: Family Medicine

## 2018-10-18 VITALS — BP 137/55 | HR 61 | Temp 98.3°F | Ht 68.0 in | Wt 175.2 lb

## 2018-10-18 DIAGNOSIS — Z0000 Encounter for general adult medical examination without abnormal findings: Secondary | ICD-10-CM | POA: Diagnosis not present

## 2018-10-18 DIAGNOSIS — I10 Essential (primary) hypertension: Secondary | ICD-10-CM

## 2018-10-18 DIAGNOSIS — E782 Mixed hyperlipidemia: Secondary | ICD-10-CM

## 2018-10-18 DIAGNOSIS — R7303 Prediabetes: Secondary | ICD-10-CM | POA: Diagnosis not present

## 2018-10-18 DIAGNOSIS — N401 Enlarged prostate with lower urinary tract symptoms: Secondary | ICD-10-CM

## 2018-10-18 DIAGNOSIS — N138 Other obstructive and reflux uropathy: Secondary | ICD-10-CM

## 2018-10-18 NOTE — Progress Notes (Signed)
Subjective:    Patient ID: Kyle Kung., male    DOB: 05-17-1940, 78 y.o.   MRN: [id number]  Kyle Joshua Ruberg [name]. is a 78 y.o. male presenting on 10/18/2018 for Annual Exam   HPI   Here for Annual Physical and Lab Review.  Pre-Diabetes Lab shows A1c 5.9. Slightly higher than last result. Attributed to diet lately CBGs: Avg 100-120, Low none, High none. Checks CBGs x 2-3weeks Meds: Never Currently on ARB Lifestyle: - Weight 5 lbs gain in past several months, admits some changes with diet during coronavirus pandemic - Diet (improved low carb, still eating salt / caffeine) - Exercise (walks 30 min daily 5 x weekly, works in garden, yard, active at home) Denies hypoglycemia  CHRONIC HTN / Intermittent PVC - controlled. Last seen by [facility] Kyle Vargas 02/15/18, and due to elevated BP, his HCTZ was increased from 12.5 up to 25mg  daily, other meds continued. Also they checked Renal Artery [place] due to spikes or elevation in BP, results show normal without stenosis. Home BP improved Current Meds - Losartan 100mg , HCTZ 25mg , Metoprolol 37.5mg  twice daily (25mg  tabs, 1.5 per dose), Diltiazem-24 hour 240mg  Reports good compliance, took meds today. Tolerating well, w/o complaints. - Taking ASA 81mg  x 5daysweekly, skips weekends. No known CAD or CVA - Followed by [facility] for PVC in past, controlled on medicines  HYPERLIPIDEMIA: - Reports concernswith chronic elevated TG for reportedly >20 years and fam history of same. Last lipid panel 10/2018 slightly elevated again TG, but normal LDL HDL - Currently takingFish Oil Omega 3, 1200 - taking x 2 a day - he has not been on Statin or Fibrate in past by his report - he has been taking Garlique supplement OTC  Health Maintenance:  BPH and Prostate CA Screening: Followed by [facility]. Prior PSA / DRE reported normal. Last PSA 2.1 (07/23/18). Currently controlled BPH LUTS only mild. No known family history of  prostate CA.   Last colonoscopy 02/08/17, [facility], polyp x 1 tubular adenoma, negative, advised to consider repeat in 5 years.  Depression screen [facility] 2/9 02/21/2018 01/30/2018 12/19/2017  Decreased Interest 0 0 0  Down, Depressed, Hopeless 0 0 0  PHQ - 2 Score 0 0 0  Altered sleeping - - -  Tired, decreased energy - - -  Change in appetite - - -  Feeling bad or failure about yourself  - - -  Trouble concentrating - - -  Suicidal thoughts - - -  PHQ-9 Score - - -  Difficult doing work/chores - - -    Past Medical History:  Diagnosis Date  . Allergic rhinitis, cause unspecified   . Diarrhea   . Dysrhythmia   . Esophageal reflux   . H/O diverticulitis of colon   . History of kidney stones   . PVC's (premature ventricular contractions)    Right ventricular outflow tract   Past Surgical History:  Procedure Laterality Date  . BASAL CELL CARCINOMA EXCISION    . COLONOSCOPY    . COLONOSCOPY WITH PROPOFOL N/A 02/08/2017   Procedure: COLONOSCOPY WITH PROPOFOL;  Surgeon: Kyle Silvas, MD;  Location: [facility] ENDOSCOPY;  Service: Endoscopy;  Laterality: N/A;  . REPAIR IMPERFORATE ANUS / ANORECTOPLASTY    . TRANSURETHRAL RESECTION OF PROSTATE     Social History   Socioeconomic History  . Marital status: Married    Spouse name: Not on file  . Number of children: Not on  file  . Years of education: Not on file  . Highest education level: Some college, no degree  Occupational History  . Not on file  Social Needs  . Financial resource strain: Not hard at all  . Food insecurity    Worry: Never true    Inability: Never true  . Transportation needs    Medical: No    Non-medical: No  Tobacco Use  . Smoking status: Never Smoker  . Smokeless tobacco: Never Used  Substance and Sexual Activity  . Alcohol use: Yes    Comment: occasionally  2 glasses of wine a month   . Drug use: No  . Sexual activity: Not on file  Lifestyle  . Physical activity    Days per week: 5 days     Minutes per session: 30 min  . Stress: Not at all  Relationships  . Social Herbalist on phone: Once a week    Gets together: More than three times a week    Attends religious service: More than 4 times per year    Active member of club or organization: No    Attends meetings of clubs or organizations: Never    Relationship status: Married  . Intimate partner violence    Fear of current or ex partner: No    Emotionally abused: No    Physically abused: No    Forced sexual activity: No  Other Topics Concern  . Not on file  Social History Narrative  . Not on file   Family History  Problem Relation Age of Onset  . Hypertension Father    Current Outpatient Medications on File Prior to Visit  Medication Sig  . aspirin 81 MG tablet Take 81 mg by mouth daily.  . Blood Pressure Monitoring (BLOOD PRESSURE CUFF) MISC 1 Device by Does not apply route daily. Normal upper arm BP cuff  . diltiazem (CARDIZEM CD) 240 MG 24 hr capsule Take 1 capsule (240 mg total) by mouth every morning.  . Garlic 10 MG CAPS Take by mouth. Unsure of dose  . glucose blood test strip ONETOUCH ULTRA BLUE (In Vitro Strip)  1 (one) Strip Strip check blood sugar once daily for 0 days  Quantity: 50;  Refills: 12   Ordered :02-Oct-2013  [name];  Started 03-Jul-2013 Active  . glucose blood test strip Use as instructed  . loratadine (CLARITIN) 10 MG tablet Take 10 mg by mouth daily.  Marland Kitchen losartan (COZAAR) 100 MG tablet Take 1 tablet (100 mg total) by mouth daily.  . metoprolol tartrate (LOPRESSOR) 25 MG tablet Take 1.5 tabs twice daily  . NON FORMULARY Take 1 tablet by mouth daily. Cholestoff 2 tablets daily.  . Omega-3 Fatty Acids (OMEGA-3 FISH OIL) 1200 MG CAPS Take 1 capsule (1,200 mg total) by mouth 3 (three) times daily.  . Sodium Chloride-Sodium Bicarb (AYR SALINE NASAL NETI RINSE) 1.57 G PACK Place into the nose as needed.   . hydrochlorothiazide (HYDRODIURIL) 25 MG tablet Take 1 tablet (25 mg  total) by mouth daily.   No current facility-administered medications on file prior to visit.     Review of Systems  Constitutional: Negative for activity change, appetite change, chills, diaphoresis, fatigue and fever.  HENT: Negative for congestion and hearing loss.   Eyes: Negative for visual disturbance.  Respiratory: Negative for apnea, cough, choking, chest tightness, shortness of breath and wheezing.   Cardiovascular: Negative for chest pain, palpitations and leg swelling.  Gastrointestinal: Negative for  abdominal pain, constipation, diarrhea, nausea and vomiting.  Endocrine: Negative for cold intolerance.  Genitourinary: Negative for difficulty urinating, dysuria, frequency and hematuria.  Musculoskeletal: Negative for arthralgias and neck pain.  Skin: Negative for rash.  Allergic/Immunologic: Negative for environmental allergies.  Neurological: Negative for dizziness, weakness, light-headedness, numbness and headaches.  Hematological: Negative for adenopathy.  Psychiatric/Behavioral: Negative for behavioral problems, dysphoric mood and sleep disturbance. The patient is not nervous/anxious.    Per HPI unless specifically indicated above      Objective:    BP (!) 137/55 (BP Location: Right Arm, Patient Position: Sitting, Cuff Size: Normal)   Pulse 61   Temp 98.3 F (36.8 C) (Oral)   Ht 5\' 8"  (1.727 m)   Wt 175 lb 3.2 oz (79.5 kg)   BMI 26.64 kg/m   Wt Readings from Last 3 Encounters:  10/18/18 175 lb 3.2 oz (79.5 kg)  02/21/18 171 lb 9.6 oz (77.8 kg)  02/15/18 172 lb (78 kg)    Physical Exam Vitals signs and nursing note reviewed.  Constitutional:      General: He is not in acute distress.    Appearance: He is well-developed. He is not diaphoretic.     Comments: Well-appearing, comfortable, cooperative  HENT:     Head: Normocephalic and atraumatic.  Eyes:     General:        Right eye: No discharge.        Left eye: No discharge.     Conjunctiva/sclera:  Conjunctivae normal.     Pupils: Pupils are equal, round, and reactive to light.  Neck:     Musculoskeletal: Normal range of motion and neck supple.     Thyroid: No thyromegaly.     Comments: No carotid bruits heard Cardiovascular:     Rate and Rhythm: Normal rate and regular rhythm.     Heart sounds: Normal heart sounds. No murmur.  Pulmonary:     Effort: Pulmonary effort is normal. No respiratory distress.     Breath sounds: Normal breath sounds. No wheezing or rales.  Abdominal:     General: Bowel sounds are normal. There is no distension.     Palpations: Abdomen is soft. There is no mass.     Tenderness: There is no abdominal tenderness.  Musculoskeletal: Normal range of motion.        General: No tenderness.     Comments: Upper / Lower Extremities: - Normal muscle tone, strength bilateral upper extremities 5/5, lower extremities 5/5  Lymphadenopathy:     Cervical: No cervical adenopathy.  Skin:    General: Skin is warm and dry.     Findings: No erythema or rash.  Neurological:     Mental Status: He is alert and oriented to person, place, and time.     Comments: Distal sensation intact to light touch all extremities  Psychiatric:        Behavior: Behavior normal.     Comments: Well groomed, good eye contact, normal speech and thoughts     Recent Labs    02/14/18 0759 10/10/18 0845  HGBA1C 5.7* 5.9*   Lipid Panel     Component Value Date/Time   CHOL 174 10/10/2018 0845   TRIG 294 (H) 10/10/2018 0845   HDL 36 (L) 10/10/2018 0845   CHOLHDL 4.8 10/10/2018 0845   VLDL 45 (H) 07/06/2016 0001   LDLCALC 99 10/10/2018 0845     Chemistry      Component Value Date/Time   NA 140 10/10/2018 0845  NA 145 (H) 03/22/2013 0856   NA 139 06/02/2012 1447   K 3.7 10/10/2018 0845   K 3.9 06/02/2012 1447   CL 104 10/10/2018 0845   CL 103 06/02/2012 1447   CO2 25 10/10/2018 0845   CO2 29 06/02/2012 1447   BUN 17 10/10/2018 0845   BUN 15 03/22/2013 0856   BUN 17 06/02/2012  1447   CREATININE 1.07 10/10/2018 0845      Component Value Date/Time   CALCIUM 9.0 10/10/2018 0845   CALCIUM 8.8 06/02/2012 1447   ALKPHOS 111 07/06/2016 0001   ALKPHOS 161 (H) 06/02/2012 1447   AST 26 10/10/2018 0845   AST 46 (H) 06/02/2012 1447   ALT 39 10/10/2018 0845   ALT 53 06/02/2012 1447   BILITOT 0.5 10/10/2018 0845   BILITOT 0.5 06/02/2012 1447        Results for orders placed or performed in visit on 10/18/18  PSA  Result Value Ref Range   PSA 2.1       Assessment & Plan:   Problem List Items Addressed This Visit    BPH with obstruction/lower urinary tract symptoms    Stable without change in LUTS Not on therapy rx Followed by Kyle Vargas Abstracted most recent PSA - Will follow-up as needed, defer future PSA if he checks at urology      Essential hypertension    Improved BP control - Home BP readings wide range sub-optimal control No known complications except history of PVCs Followed by Cardiology   Plan:  1. Continue current BP regimen - Losartan 100mg , HCTZ 25mg , Metoprolol 37.5mg  BID (1.5 tabs of 25mg ) 2. Encourage improved lifestyle - low sodium diet, continue regular exercise 3. Continue monitor BP outside office, bring readings to next visit, if persistently >140/90 or new symptoms notify office sooner 4. Follow-up 1 year      Hyperlipidemia    Elevated again HyperTG due to diet Controlled HDL, LDL, not on statin Last lipid 10/2018 Calculated ASCVD 10 yr risk score 28.8%, elevated  Plan: 1. Discussion on ASCVD risk - he understands risks and prefers to avoid new meds such as Statin, we agree emphasis on lifestyle to help improve his cholesterol and reduce risk. - CONTINUE Fish Oil Omega 3 from 1200 BID to TID up to 3600 daily, and continue other OTC herbal supplement garlique 2. Continue ASA 81mg  for primary ASCVD risk reduction 3. Encourage improved lifestyle - low carb/cholesterol, reduce portion size, continue  improving regular exercise      Pre-diabetes    Stable PreDM A1c 5.9 Concern with HTN, HLD  Plan:  1. Not on any therapy currently - again defer new med today - since improved 2. Encourage improved lifestyle - low carb, low sugar diet, reduce portion size, continue improving regular exercise  yearly       Other Visit Diagnoses    Annual physical exam    -  Primary      Updated Health Maintenance information Reviewed recent lab results with patient Encouraged improvement to lifestyle with diet and exercise - Goal of weight loss   No orders of the defined types were placed in this encounter.   Follow up plan: Return in about 1 year (around 10/18/2019) for Annual Physical.  Future labs ordered for 1 year   Nobie Putnam, [facility] 10/18/2018, 8:36 AM

## 2018-10-18 NOTE — Assessment & Plan Note (Signed)
Stable without change in LUTS Not on therapy rx Followed by [facility] Dr Michael Wolff Abstracted most recent PSA - Will follow-up as needed, defer future PSA if he checks at urology 

## 2018-10-18 NOTE — Assessment & Plan Note (Signed)
Stable PreDM A1c 5.9 Concern with HTN, HLD  Plan:  1. Not on any therapy currently - again defer new med today - since improved 2. Encourage improved lifestyle - low carb, low sugar diet, reduce portion size, continue improving regular exercise  yearly

## 2018-10-18 NOTE — Assessment & Plan Note (Signed)
Improved BP control - Home BP readings wide range sub-optimal control No known complications except history of PVCs Followed by Cardiology   Plan:  1. Continue current BP regimen - Losartan 100mg , HCTZ 25mg , Metoprolol 37.5mg  BID (1.5 tabs of 25mg ) 2. Encourage improved lifestyle - low sodium diet, continue regular exercise 3. Continue monitor BP outside office, bring readings to next visit, if persistently >140/90 or new symptoms notify office sooner 4. Follow-up 1 year

## 2018-10-18 NOTE — Patient Instructions (Addendum)
Thank you for coming to the office today.  For gas, can try Omeprazole 20mg  OTC before breakfast for 2-4 weeks as a trial  Also try Gas-X if need  Cholesterol Triglyceride still elevated. Keep on fish oil. In future reconsider cholesterol med.  Otherwise doing well, slightly increased blood sugar again  Recent Labs    02/14/18 0759 10/10/18 0845  HGBA1C 5.7* 5.9*   Keep improving diet and exercise  DUE for FASTING BLOOD WORK (no food or drink after midnight before the lab appointment, only water or coffee without cream/sugar on the morning of)  SCHEDULE "Lab Only" visit in the morning at the clinic for lab draw in 1 YEAR  - Make sure Lab Only appointment is at about 1 week before your next appointment, so that results will be available  For Lab Results, once available within 2-3 days of blood draw, you can can log in to MyChart online to view your results and a brief explanation. Also, we can discuss results at next follow-up visit.   Please schedule a Follow-up Appointment to: Return in about 1 year (around 10/18/2019) for Annual Physical.  If you have any other questions or concerns, please feel free to call the office or send a message through [facility]. You may also schedule an earlier appointment if necessary.  Additionally, you may be receiving a survey about your experience at our office within a few days to 1 week by e-mail or mail. We value your feedback.  Nobie Putnam, DO [place]

## 2018-10-18 NOTE — Assessment & Plan Note (Signed)
Elevated again HyperTG due to diet Controlled HDL, LDL, not on statin Last lipid 10/2018 Calculated ASCVD 10 yr risk score 28.8%, elevated  Plan: 1. Discussion on ASCVD risk - he understands risks and prefers to avoid new meds such as Statin, we agree emphasis on lifestyle to help improve his cholesterol and reduce risk. - CONTINUE Fish Oil Omega 3 from 1200 BID to TID up to 3600 daily, and continue other OTC herbal supplement garlique 2. Continue ASA 81mg  for primary ASCVD risk reduction 3. Encourage improved lifestyle - low carb/cholesterol, reduce portion size, continue improving regular exercise

## 2018-12-12 DIAGNOSIS — H2513 Age-related nuclear cataract, bilateral: Secondary | ICD-10-CM | POA: Diagnosis not present

## 2018-12-24 NOTE — Progress Notes (Signed)
Subjective:   Kyle Filbert Getchell [name]. is a 78 y.o. male who presents for Medicare Annual (Subsequent) preventive examination.  This visit is being conducted via phone call  - after an attmept to do on video chat - due to the COVID-19 pandemic. This patient has given me verbal consent via phone to conduct this visit, patient states they are participating from their home address. Some vital signs may be absent or patient reported.   Patient identification: identified by name, DOB, and current address.    Review of Systems:   Cardiac Risk Factors include: advanced age (>70men, >27 women);male gender;dyslipidemia;hypertension     Objective:     Vitals: BP (!) 143/72 Comment: pt reported  Ht 5\' 8"  (1.727 m) Comment: pt reported  Wt 172 lb (78 kg) Comment: pt reported  BMI 26.15 kg/m   Body mass index is 26.15 kg/m.  Advanced Directives 12/25/2018 12/19/2017 02/08/2017 12/13/2016  Does Patient Have a Medical Advance Directive? Yes Yes Yes Yes  Type of Advance Directive Living will;Healthcare Power of [place];Living will [place];Living will [place];Living will  Copy of [place] in Chart? - No - copy requested No - copy requested No - copy requested    Tobacco Social History   Tobacco Use  Smoking Status Never Smoker  Smokeless Tobacco Never Used     Counseling given: Not Answered   Clinical Intake:  Pre-visit preparation completed: Yes  Pain : No/denies pain     Nutritional Status: BMI 25 -29 Overweight Nutritional Risks: None Diabetes: No  How often do you need to have someone help you when you read instructions, pamphlets, or other written materials from your doctor or pharmacy?: 1 - Never  Interpreter Needed?: No  Information entered by ::  ,LPN  Past Medical History:  Diagnosis Date  . Allergic rhinitis, cause unspecified   . Diarrhea   . Dysrhythmia   .  Esophageal reflux   . H/O diverticulitis of colon   . History of kidney stones   . PVC's (premature ventricular contractions)    Right ventricular outflow tract   Past Surgical History:  Procedure Laterality Date  . BASAL CELL CARCINOMA EXCISION    . COLONOSCOPY    . COLONOSCOPY WITH PROPOFOL N/A 02/08/2017   Procedure: COLONOSCOPY WITH PROPOFOL;  Surgeon: Manya Silvas, MD;  Location[facility] ENDOSCOPY;  Service: Endoscopy;  Laterality: N/A;  . REPAIR IMPERFORATE ANUS / ANORECTOPLASTY    . TRANSURETHRAL RESECTION OF PROSTATE     Family History  Problem Relation Age of Onset  . Hypertension Father    Social History   Socioeconomic History  . Marital status: Married    Spouse name: Not on file  . Number of children: Not on file  . Years of education: Not on file  . Highest education level: Some college, no degree  Occupational History  . Occupation: retired  Scientific laboratory technician  . Financial resource strain: Not hard at all  . Food insecurity    Worry: Never true    Inability: Never true  . Transportation needs    Medical: No    Non-medical: No  Tobacco Use  . Smoking status: Never Smoker  . Smokeless tobacco: Never Used  Substance and Sexual Activity  . Alcohol use: Yes    Comment: occasionally  2 glasses of wine a month   . Drug use: No  . Sexual activity: Not on file  Lifestyle  .  Physical activity    Days per week: 5 days    Minutes per session: 30 min  . Stress: Not at all  Relationships  . Social Herbalist on phone: Once a week    Gets together: More than three times a week    Attends religious service: More than 4 times per year    Active member of club or organization: No    Attends meetings of clubs or organizations: Never    Relationship status: Married  Other Topics Concern  . Not on file  Social History Narrative  . Not on file    Outpatient Encounter Medications as of 12/25/2018  Medication Sig  . aspirin 81 MG tablet Take 81 mg by mouth  daily.  . Blood Pressure Monitoring (BLOOD PRESSURE CUFF) MISC 1 Device by Does not apply route daily. Normal upper arm BP cuff  . diltiazem (CARDIZEM CD) 240 MG 24 hr capsule Take 1 capsule (240 mg total) by mouth every morning.  . Garlic 10 MG CAPS Take by mouth. Unsure of dose  . glucose blood test strip ONETOUCH ULTRA BLUE (In Vitro Strip)  1 (one) Strip Strip check blood sugar once daily for 0 days  Quantity: 50;  Refills: 12   Ordered :02-Oct-2013  [name];  Started 03-Jul-2013 Active  . glucose blood test strip Use as instructed  . hydrochlorothiazide (HYDRODIURIL) 25 MG tablet Take 1 tablet (25 mg total) by mouth daily.  Marland Kitchen losartan (COZAAR) 100 MG tablet Take 1 tablet (100 mg total) by mouth daily.  . metoprolol tartrate (LOPRESSOR) 25 MG tablet Take 1.5 tabs twice daily  . Omega-3 Fatty Acids (OMEGA-3 FISH OIL) 1200 MG CAPS Take 1 capsule (1,200 mg total) by mouth 3 (three) times daily.  . Sodium Chloride-Sodium Bicarb (AYR SALINE NASAL NETI RINSE) 1.57 G PACK Place into the nose as needed.   . loratadine (CLARITIN) 10 MG tablet Take 10 mg by mouth daily.  . [DISCONTINUED] NON FORMULARY Take 1 tablet by mouth daily. Cholestoff 2 tablets daily.   No facility-administered encounter medications on file as of 12/25/2018.     Activities of Daily Living In your present state of health, do you have any difficulty performing the following activities: 12/25/2018  Hearing? N  Comment no hearing aids  Vision? Y  Comment having cataract sx at [place] coming up.  Difficulty concentrating or making decisions? N  Walking or climbing stairs? N  Dressing or bathing? N  Doing errands, shopping? N  Preparing Food and eating ? N  Using the Toilet? N  In the past six months, have you accidently leaked urine? N  Do you have problems with loss of bowel control? N  Managing your Medications? N  Managing your Finances? N  Housekeeping or managing your Housekeeping? N  Some recent data might be  hidden    Patient Care Team: [name], DO as PCP - General (Family Medicine) Jannet Mantis, MD (Dermatology) Royston Cowper, MD as Consulting Physician (Urology) Wellington Hampshire, MD as Consulting Physician (Cardiology) Anell Barr, OD (Optometry)    Assessment:   This is a routine wellness examination for Kyle Vargas.  Exercise Activities and Dietary recommendations Current Exercise Habits: Home exercise routine, Type of exercise: walking, Time (Minutes): 30, Frequency (Times/Week): 5, Weekly Exercise (Minutes/Week): 150, Intensity: Mild, Exercise limited by: None identified  Goals    . Exercise 3x per week (30 min per time)     Patient wants  to continue walking daily to stay healthy. He currently walks 30 mins five days a week.    . Increase water intake     Recommend drinking at least 6-8 glasses of water a day        Fall Risk: Fall Risk  12/25/2018 02/21/2018 01/30/2018 12/19/2017 02/15/2017  Falls in the past year? 0 No No No No    FALL RISK PREVENTION PERTAINING TO THE HOME:  Any stairs in or around the home? Yes  If so, are there any without handrails? No   Home free of loose throw rugs in walkways, pet beds, electrical cords, etc? Yes  Adequate lighting in your home to reduce risk of falls? Yes   ASSISTIVE DEVICES UTILIZED TO PREVENT FALLS:  Life alert? No  Use of a cane, walker or w/c? No  Grab bars in the bathroom? No  Shower chair or bench in shower? No  Elevated toilet seat or a handicapped toilet? No   DME ORDERS:  DME order needed?  No   TIMED UP AND GO:  Unable to perform    Depression Screen PHQ 2/9 Scores 12/25/2018 02/21/2018 01/30/2018 12/19/2017  PHQ - 2 Score 0 0 0 0  PHQ- 9 Score - - - -     Cognitive Function MMSE - Mini Mental State Exam 01/23/2015  Orientation to time 5  Orientation to Place 5  Registration 3  Attention/ Calculation 5  Recall 2  Language- name 2 objects 2  Language- repeat 1  Language-  follow 3 step command 3  Language- read & follow direction 1  Write a sentence 1  Copy design 1  Total score 29     6CIT Screen 12/25/2018 12/19/2017 12/13/2016  What Year? 0 points 0 points 0 points  What month? 0 points 0 points 0 points  What time? 0 points 0 points 0 points  Count back from 20 0 points 0 points 0 points  Months in reverse 0 points 0 points 0 points  Repeat phrase 0 points 0 points 0 points  Total Score 0 0 0    Immunization History  Administered Date(s) Administered  . Influenza, High Dose Seasonal PF 02/11/2014, 01/23/2015, 02/04/2016, 02/15/2017  . Influenza-Unspecified 03/09/2012, 03/14/2014, 02/07/2018  . Pneumococcal Conjugate-13 01/21/2014  . Pneumococcal Polysaccharide-23 03/09/2008  . Tdap 01/21/2014, 11/29/2016  . Zoster 01/23/2015    Qualifies for Shingles Vaccine? Yes  Zostavax completed. Due for Shingrix. Education has been provided regarding the importance of this vaccine. Pt has been advised to call insurance company to determine out of pocket expense. Advised may also receive vaccine at local pharmacy or Health Dept. Verbalized acceptance and understanding.  Tdap: up to date .  Flu Vaccine: Due 01/2019  Pneumococcal Vaccine: up to date    Screening Tests Health Maintenance  Topic Date Due  . INFLUENZA VACCINE  12/08/2018  . TETANUS/TDAP  11/30/2026  . PNA vac Low Risk Adult  Completed    Cancer Screenings:  Colorectal Screening: no longer required  Lung Cancer Screening: (Low Dose CT Chest recommended if Age 66-80 years, 30 pack-year currently smoking OR have quit w/in 15years.) does not qualify.    Additional Screening:  Hepatitis C Screening: does not qualify  Vision Screening: Recommended annual ophthalmology exams for early detection of glaucoma and other disorders of the eye. Is the patient up to date with their annual eye exam?  Yes  Who is the provider or what is the name of the office in which the pt  attends annual eye  exams? [facility]   Dental Screening: Recommended annual dental exams for proper oral hygiene  Community Resource Referral:  CRR required this visit?  No       Plan:  I have personally reviewed and addressed the Medicare Annual Wellness questionnaire and have noted the following in the patient's chart:  A. Medical and social history B. Use of alcohol, tobacco or illicit drugs  C. Current medications and supplements D. Functional ability and status E.  Nutritional status F.  Physical activity G. Advance directives H. List of other physicians I.  Hospitalizations, surgeries, and ER visits in previous 12 months J.  [place] such as hearing and vision if needed, cognitive and depression L. Referrals and appointments   In addition, I have reviewed and discussed with patient certain preventive protocols, quality metrics, and best practice recommendations. A written personalized care plan for preventive services as well as general preventive health recommendations were provided to patient.  Signed,    Bevelyn Ngo, LPN  01/22/9449 Nurse Health Advisor   Nurse Notes: none

## 2018-12-25 ENCOUNTER — Ambulatory Visit: Payer: Medicare HMO

## 2018-12-25 ENCOUNTER — Ambulatory Visit (INDEPENDENT_AMBULATORY_CARE_PROVIDER_SITE_OTHER): Payer: Medicare Other

## 2018-12-25 VITALS — BP 143/72 | Ht 68.0 in | Wt 172.0 lb

## 2018-12-25 DIAGNOSIS — Z0000 Encounter for general adult medical examination without abnormal findings: Secondary | ICD-10-CM

## 2018-12-25 NOTE — Patient Instructions (Addendum)
[name] , Thank you for taking time to come for your Medicare Wellness Visit. I appreciate your ongoing commitment to your health goals. Please review the following plan we discussed and let me know if I can assist you in the future.   Screening recommendations/referrals: Colonoscopy: no longer required Recommended yearly ophthalmology/optometry visit for glaucoma screening and checkup Recommended yearly dental visit for hygiene and checkup  Vaccinations: Influenza vaccine: due 01/2019 Pneumococcal vaccine: up to date  Tdap vaccine: up to date Shingles vaccine: shingrix eligible, check with your insurance for coverage     Advanced directives: Please bring a copy of your health care power of attorney and living will to the office at your convenience.  Conditions/risks identified: none  Next appointment: Follow up in one year for your annual wellness visit.   Preventive Care 50 Years and Older, Male Preventive care refers to lifestyle choices and visits with your health care provider that can promote health and wellness. What does preventive care include?  A yearly physical exam. This is also called an annual well check.  Dental exams once or twice a year.  Routine eye exams. Ask your health care provider how often you should have your eyes checked.  Personal lifestyle choices, including:  Daily care of your teeth and gums.  Regular physical activity.  Eating a healthy diet.  Avoiding tobacco and drug use.  Limiting alcohol use.  Practicing safe sex.  Taking low doses of aspirin every day.  Taking vitamin and mineral supplements as recommended by your health care provider. What happens during an annual well check? The services and screenings done by your health care provider during your annual well check will depend on your age, overall health, lifestyle risk factors, and family history of disease. Counseling  Your health care provider may ask you questions about  your:  Alcohol use.  Tobacco use.  Drug use.  Emotional well-being.  Home and relationship well-being.  Sexual activity.  Eating habits.  History of falls.  Memory and ability to understand (cognition).  Work and work Statistician. Screening  You may have the following tests or measurements:  Height, weight, and BMI.  Blood pressure.  Lipid and cholesterol levels. These may be checked every 5 years, or more frequently if you are over 66 years old.  Skin check.  Lung cancer screening. You may have this screening every year starting at age 76 if you have a 30-pack-year history of smoking and currently smoke or have quit within the past 15 years.  Fecal occult blood test (FOBT) of the stool. You may have this test every year starting at age 37.  Flexible sigmoidoscopy or colonoscopy. You may have a sigmoidoscopy every 5 years or a colonoscopy every 10 years starting at age 19.  Prostate cancer screening. Recommendations will vary depending on your family history and other risks.  Hepatitis C blood test.  Hepatitis B blood test.  Sexually transmitted disease (STD) testing.  Diabetes screening. This is done by checking your blood sugar (glucose) after you have not eaten for a while (fasting). You may have this done every 1-3 years.  Abdominal aortic aneurysm (AAA) screening. You may need this if you are a current or former smoker.  Osteoporosis. You may be screened starting at age 61 if you are at high risk. Talk with your health care provider about your test results, treatment options, and if necessary, the need for more tests. Vaccines  Your health care provider may recommend certain vaccines, such  as:  Influenza vaccine. This is recommended every year.  Tetanus, diphtheria, and acellular pertussis (Tdap, Td) vaccine. You may need a Td booster every 10 years.  Zoster vaccine. You may need this after age 58.  Pneumococcal 13-valent conjugate (PCV13) vaccine.  One dose is recommended after age 48.  Pneumococcal polysaccharide (PPSV23) vaccine. One dose is recommended after age 52. Talk to your health care provider about which screenings and vaccines you need and how often you need them. This information is not intended to replace advice given to you by your health care provider. Make sure you discuss any questions you have with your health care provider. Document Released: 05/22/2015 Document Revised: 01/13/2016 Document Reviewed: 02/24/2015 Elsevier Interactive Patient Education  2017 [place] Prevention in the Home Falls can cause injuries. They can happen to people of all ages. There are many things you can do to make your home safe and to help prevent falls. What can I do on the outside of my home?  Regularly fix the edges of walkways and driveways and fix any cracks.  Remove anything that might make you trip as you walk through a door, such as a raised step or threshold.  Trim any bushes or trees on the path to your home.  Use bright outdoor lighting.  Clear any walking paths of anything that might make someone trip, such as rocks or tools.  Regularly check to see if handrails are loose or broken. Make sure that both sides of any steps have handrails.  Any raised decks and porches should have guardrails on the edges.  Have any leaves, snow, or ice cleared regularly.  Use sand or salt on walking paths during winter.  Clean up any spills in your garage right away. This includes oil or grease spills. What can I do in the bathroom?  Use night lights.  Install grab bars by the toilet and in the tub and shower. Do not use towel bars as grab bars.  Use non-skid mats or decals in the tub or shower.  If you need to sit down in the shower, use a plastic, non-slip stool.  Keep the floor dry. Clean up any water that spills on the floor as soon as it happens.  Remove soap buildup in the tub or shower regularly.  Attach bath  mats securely with double-sided non-slip rug tape.  Do not have throw rugs and other things on the floor that can make you trip. What can I do in the bedroom?  Use night lights.  Make sure that you have a light by your bed that is easy to reach.  Do not use any sheets or blankets that are too big for your bed. They should not hang down onto the floor.  Have a firm chair that has side arms. You can use this for support while you get dressed.  Do not have throw rugs and other things on the floor that can make you trip. What can I do in the kitchen?  Clean up any spills right away.  Avoid walking on wet floors.  Keep items that you use a lot in easy-to-reach places.  If you need to reach something above you, use a strong step stool that has a grab bar.  Keep electrical cords out of the way.  Do not use floor polish or wax that makes floors slippery. If you must use wax, use non-skid floor wax.  Do not have throw rugs and other things on the  floor that can make you trip. What can I do with my stairs?  Do not leave any items on the stairs.  Make sure that there are handrails on both sides of the stairs and use them. Fix handrails that are broken or loose. Make sure that handrails are as long as the stairways.  Check any carpeting to make sure that it is firmly attached to the stairs. Fix any carpet that is loose or worn.  Avoid having throw rugs at the top or bottom of the stairs. If you do have throw rugs, attach them to the floor with carpet tape.  Make sure that you have a light switch at the top of the stairs and the bottom of the stairs. If you do not have them, ask someone to add them for you. What else can I do to help prevent falls?  Wear shoes that:  Do not have high heels.  Have rubber bottoms.  Are comfortable and fit you well.  Are closed at the toe. Do not wear sandals.  If you use a stepladder:  Make sure that it is fully opened. Do not climb a closed  stepladder.  Make sure that both sides of the stepladder are locked into place.  Ask someone to hold it for you, if possible.  Clearly mark and make sure that you can see:  Any grab bars or handrails.  First and last steps.  Where the edge of each step is.  Use tools that help you move around (mobility aids) if they are needed. These include:  Canes.  Walkers.  Scooters.  Crutches.  Turn on the lights when you go into a dark area. Replace any light bulbs as soon as they burn out.  Set up your furniture so you have a clear path. Avoid moving your furniture around.  If any of your floors are uneven, fix them.  If there are any pets around you, be aware of where they are.  Review your medicines with your doctor. Some medicines can make you feel dizzy. This can increase your chance of falling. Ask your doctor what other things that you can do to help prevent falls. This information is not intended to replace advice given to you by your health care provider. Make sure you discuss any questions you have with your health care provider. Document Released: 02/19/2009 Document Revised: 10/01/2015 Document Reviewed: 05/30/2014 Elsevier Interactive Patient Education  2017 [employer].

## 2019-01-07 DIAGNOSIS — H43812 Vitreous degeneration, left eye: Secondary | ICD-10-CM | POA: Diagnosis not present

## 2019-01-07 DIAGNOSIS — H25813 Combined forms of age-related cataract, bilateral: Secondary | ICD-10-CM | POA: Diagnosis not present

## 2019-01-20 DIAGNOSIS — H25813 Combined forms of age-related cataract, bilateral: Secondary | ICD-10-CM | POA: Diagnosis not present

## 2019-01-22 DIAGNOSIS — H52221 Regular astigmatism, right eye: Secondary | ICD-10-CM | POA: Diagnosis not present

## 2019-01-22 DIAGNOSIS — K219 Gastro-esophageal reflux disease without esophagitis: Secondary | ICD-10-CM | POA: Diagnosis not present

## 2019-01-22 DIAGNOSIS — Z87891 Personal history of nicotine dependence: Secondary | ICD-10-CM | POA: Diagnosis not present

## 2019-01-22 DIAGNOSIS — I10 Essential (primary) hypertension: Secondary | ICD-10-CM | POA: Diagnosis not present

## 2019-01-22 DIAGNOSIS — E1136 Type 2 diabetes mellitus with diabetic cataract: Secondary | ICD-10-CM | POA: Diagnosis not present

## 2019-01-22 DIAGNOSIS — H25811 Combined forms of age-related cataract, right eye: Secondary | ICD-10-CM | POA: Diagnosis not present

## 2019-01-29 DIAGNOSIS — H2511 Age-related nuclear cataract, right eye: Secondary | ICD-10-CM | POA: Diagnosis not present

## 2019-02-03 DIAGNOSIS — H25813 Combined forms of age-related cataract, bilateral: Secondary | ICD-10-CM | POA: Diagnosis not present

## 2019-02-05 DIAGNOSIS — E1136 Type 2 diabetes mellitus with diabetic cataract: Secondary | ICD-10-CM | POA: Diagnosis not present

## 2019-02-05 DIAGNOSIS — Z9841 Cataract extraction status, right eye: Secondary | ICD-10-CM | POA: Diagnosis not present

## 2019-02-05 DIAGNOSIS — H25812 Combined forms of age-related cataract, left eye: Secondary | ICD-10-CM | POA: Diagnosis not present

## 2019-02-05 DIAGNOSIS — I10 Essential (primary) hypertension: Secondary | ICD-10-CM | POA: Diagnosis not present

## 2019-02-05 DIAGNOSIS — K219 Gastro-esophageal reflux disease without esophagitis: Secondary | ICD-10-CM | POA: Diagnosis not present

## 2019-02-05 DIAGNOSIS — Z87891 Personal history of nicotine dependence: Secondary | ICD-10-CM | POA: Diagnosis not present

## 2019-02-05 DIAGNOSIS — H52222 Regular astigmatism, left eye: Secondary | ICD-10-CM | POA: Diagnosis not present

## 2019-02-05 DIAGNOSIS — Z961 Presence of intraocular lens: Secondary | ICD-10-CM | POA: Diagnosis not present

## 2019-02-07 DIAGNOSIS — C44719 Basal cell carcinoma of skin of left lower limb, including hip: Secondary | ICD-10-CM | POA: Diagnosis not present

## 2019-02-12 DIAGNOSIS — H2512 Age-related nuclear cataract, left eye: Secondary | ICD-10-CM | POA: Diagnosis not present

## 2019-02-19 ENCOUNTER — Encounter: Payer: Self-pay | Admitting: Cardiovascular Disease

## 2019-02-19 ENCOUNTER — Other Ambulatory Visit: Payer: Self-pay

## 2019-02-19 ENCOUNTER — Ambulatory Visit (INDEPENDENT_AMBULATORY_CARE_PROVIDER_SITE_OTHER): Payer: Medicare Other | Admitting: Cardiovascular Disease

## 2019-02-19 VITALS — BP 138/60 | HR 63 | Temp 97.0°F | Ht 68.0 in | Wt 178.0 lb

## 2019-02-19 DIAGNOSIS — I10 Essential (primary) hypertension: Secondary | ICD-10-CM | POA: Diagnosis not present

## 2019-02-19 DIAGNOSIS — I493 Ventricular premature depolarization: Secondary | ICD-10-CM | POA: Diagnosis not present

## 2019-02-19 MED ORDER — DILTIAZEM HCL ER COATED BEADS 240 MG PO CP24: 240.0000 mg | ORAL_CAPSULE | Freq: Every morning | ORAL | 3 refills | Status: DC

## 2019-02-19 MED ORDER — HYDROCHLOROTHIAZIDE 25 MG PO TABS: 25.0000 mg | ORAL_TABLET | Freq: Every day | ORAL | 3 refills | Status: DC

## 2019-02-19 NOTE — Patient Instructions (Signed)
Medication Instructions:  Your physician recommends that you continue on your current medications as directed. Please refer to the Current Medication list given to you today.  If you need a refill on your cardiac medications before your next appointment, please call your pharmacy.   Lab work: None ordered If you have labs (blood work) drawn today and your tests are completely normal, you will receive your results only by: . MyChart Message (if you have MyChart) OR . A paper copy in the mail If you have any lab test that is abnormal or we need to change your treatment, we will call you to review the results.  Testing/Procedures: None ordered  Follow-Up: At [facility], you and your health needs are our priority.  As part of our continuing mission to provide you with exceptional heart care, we have created designated Provider Care Teams.  These Care Teams include your primary Cardiologist (physician) and Advanced Practice Providers (APPs -  Physician Assistants and Nurse Practitioners) who all work together to provide you with the care you need, when you need it. You will need a follow up appointment in 12 months.  Please call our office 2 months in advance to schedule this appointment.  You may see  Dr. Arida or one of the following Advanced Practice Providers on your designated Care Team:   Christopher Berge, NP Ryan Dunn, PA-C . Jacquelyn Visser, PA-C  Any Other Special Instructions Will Be Listed Below (If Applicable). N/A   

## 2019-02-19 NOTE — Progress Notes (Signed)
Cardiology Office Note   Date:  02/19/2019   ID:  Kyle Kung., DOB [birth year], MRN [id number]  PCP:  [name], DO  Cardiologist:   Kathlyn Sacramento, MD   Chief Complaint  Patient presents with  . other    12 month follow up. Meds reviewded by the pt. verbally. "doing well."       History of Present Illness: Kyle Vargas. is a 78 y.o. male who presents for a followup visit regarding palpitations and PVCs. The patient has prolonged history of palpitations thought to be due to PVCs. Negative stress test in the past.  Echocardiogram in 02/14 showed normal LV systolic function without significant valvular abnormalities. There was mild left ventricular hypertrophy with mild diastolic dysfunction. Holter monitor showed excessive monomorphic PVCs of RVOT origin. He has been treated with diltiazem and Metoprolol with excellent control of symptoms. He continues to be very active and enjoys babysitting his grandkids.  He has known history of refractory hypertension.  Renal artery duplex in 2019 showed no evidence of renal artery stenosis.  He has been doing well with no recent chest pain, shortness of breath or palpitations.  No dizziness, syncope or presyncope.    Past Medical History:  Diagnosis Date  . Allergic rhinitis, cause unspecified   . Diarrhea   . Dysrhythmia   . Esophageal reflux   . H/O diverticulitis of colon   . History of kidney stones   . PVC's (premature ventricular contractions)    Right ventricular outflow tract    Past Surgical History:  Procedure Laterality Date  . BASAL CELL CARCINOMA EXCISION    . CATARACT EXTRACTION, BILATERAL  2020  . COLONOSCOPY    . COLONOSCOPY WITH PROPOFOL N/A 02/08/2017   Procedure: COLONOSCOPY WITH PROPOFOL;  Surgeon: Manya Silvas, MD;  Location: [facility] ENDOSCOPY;  Service: Endoscopy;  Laterality: N/A;  . REPAIR IMPERFORATE ANUS / ANORECTOPLASTY    . TRANSURETHRAL RESECTION OF PROSTATE        Current Outpatient Medications  Medication Sig Dispense Refill  . aspirin 81 MG tablet Take 81 mg by mouth daily.    Marland Kitchen azelastine (ASTELIN) 0.1 % nasal spray Place 1 spray into both nostrils 2 (two) times daily.     . Blood Pressure Monitoring (BLOOD PRESSURE CUFF) MISC 1 Device by Does not apply route daily. Normal upper arm BP cuff 1 each 0  . Bromfenac Sodium 0.075 % SOLN Apply to eye 2 (two) times daily.     Marland Kitchen BROMSITE 0.075 % SOLN 2 (two) times daily.     Marland Kitchen diltiazem (CARDIZEM CD) 240 MG 24 hr capsule Take 1 capsule (240 mg total) by mouth every morning. 90 capsule 3  . Garlic 10 MG CAPS Take by mouth. Unsure of dose    . glucose blood test strip ONETOUCH ULTRA BLUE (In Vitro Strip)  1 (one) Strip Strip check blood sugar once daily for 0 days  Quantity: 50;  Refills: 12   Ordered :02-Oct-2013  [name];  Started 03-Jul-2013 Active    . glucose blood test strip Use as instructed 100 each 12  . hydrochlorothiazide (HYDRODIURIL) 25 MG tablet Take 1 tablet (25 mg total) by mouth daily. 90 tablet 3  . loratadine (CLARITIN) 10 MG tablet Take 10 mg by mouth daily.    Marland Kitchen losartan (COZAAR) 100 MG tablet Take 1 tablet (100 mg total) by mouth daily. 90 tablet 1  . Loteprednol Etabonate (INVELTYS) 1 % SUSP Apply  to eye 2 (two) times daily.     . metoprolol tartrate (LOPRESSOR) 25 MG tablet Take 1.5 tabs twice daily 270 tablet 2  . Omega-3 Fatty Acids (OMEGA-3 FISH OIL) 1200 MG CAPS Take 1 capsule (1,200 mg total) by mouth 3 (three) times daily. 90 capsule 0  . Sodium Chloride-Sodium Bicarb (AYR SALINE NASAL NETI RINSE) 1.57 G PACK Place into the nose as needed.      No current facility-administered medications for this visit.     Allergies:   Dust mite extract and Other    Social History:  The patient  reports that he has never smoked. He has never used smokeless tobacco. He reports current alcohol use. He reports that he does not use drugs.   Family History:  The patient's family  history includes Hypertension in his father.    ROS:  Please see the history of present illness.   Otherwise, review of systems are positive for none.   All other systems are reviewed and negative.    PHYSICAL EXAM: VS:  BP 138/60 (BP Location: Left Arm, Patient Position: Sitting, Cuff Size: Normal)   Pulse 63   Temp (!) 97 F (36.1 C)   Ht 5\' 8"  (1.727 m)   Wt 178 lb (80.7 kg)   BMI 27.06 kg/m  , BMI Body mass index is 27.06 kg/m. GEN: Well nourished, well developed, in no acute distress  HEENT: normal  Neck: no JVD, carotid bruits, or masses Cardiac: RRR; no murmurs, rubs, or gallops,no edema  Respiratory:  clear to auscultation bilaterally, normal work of breathing GI: soft, nontender, nondistended, + BS MS: no deformity or atrophy  Skin: warm and dry, no rash Neuro:  Strength and sensation are intact Psych: euthymic mood, full affect   EKG:  EKG is ordered today. The ekg ordered today demonstrates normal sinus rhythm with right bundle branch block.   Recent Labs: 10/10/2018: ALT 39; BUN 17; Creat 1.07; Hemoglobin 14.7; Platelets 229; Potassium 3.7; Sodium 140    Lipid Panel    Component Value Date/Time   CHOL 174 10/10/2018 0845   TRIG 294 (H) 10/10/2018 0845   HDL 36 (L) 10/10/2018 0845   CHOLHDL 4.8 10/10/2018 0845   VLDL 45 (H) 07/06/2016 0001   LDLCALC 99 10/10/2018 0845      Wt Readings from Last 3 Encounters:  02/19/19 178 lb (80.7 kg)  12/25/18 172 lb (78 kg)  10/18/18 175 lb 3.2 oz (79.5 kg)       No flowsheet data found.    ASSESSMENT AND PLAN:  1.  Symptomatic PVCs: Well controlled on current dose of diltiazem and metoprolol with no side effects. Normal ejection fraction. Continue same medications.  2. Essential hypertension: Blood pressure is reasonably controlled on current medications.  There was no evidence of renal artery stenosis on duplex from last year.   Disposition:   FU with me in 1 year  Signed,  Kathlyn Sacramento, MD   02/19/2019 1:54 PM    [place]

## 2019-02-21 ENCOUNTER — Other Ambulatory Visit: Payer: Self-pay | Admitting: Cardiovascular Disease

## 2019-03-27 DIAGNOSIS — H35373 Puckering of macula, bilateral: Secondary | ICD-10-CM | POA: Diagnosis not present

## 2019-03-27 DIAGNOSIS — Z961 Presence of intraocular lens: Secondary | ICD-10-CM | POA: Diagnosis not present

## 2019-03-27 DIAGNOSIS — H43812 Vitreous degeneration, left eye: Secondary | ICD-10-CM | POA: Diagnosis not present

## 2019-03-27 DIAGNOSIS — H43391 Other vitreous opacities, right eye: Secondary | ICD-10-CM | POA: Diagnosis not present

## 2019-04-12 ENCOUNTER — Other Ambulatory Visit: Payer: Self-pay | Admitting: Family Medicine

## 2019-04-12 DIAGNOSIS — I10 Essential (primary) hypertension: Secondary | ICD-10-CM

## 2019-04-12 DIAGNOSIS — I493 Ventricular premature depolarization: Secondary | ICD-10-CM

## 2019-04-16 ENCOUNTER — Other Ambulatory Visit: Payer: Self-pay

## 2019-06-26 DIAGNOSIS — H43812 Vitreous degeneration, left eye: Secondary | ICD-10-CM | POA: Diagnosis not present

## 2019-06-26 DIAGNOSIS — H43813 Vitreous degeneration, bilateral: Secondary | ICD-10-CM | POA: Diagnosis not present

## 2019-06-26 DIAGNOSIS — H35373 Puckering of macula, bilateral: Secondary | ICD-10-CM | POA: Diagnosis not present

## 2019-06-26 DIAGNOSIS — Z961 Presence of intraocular lens: Secondary | ICD-10-CM | POA: Diagnosis not present

## 2019-06-28 ENCOUNTER — Telehealth: Payer: Self-pay | Admitting: Family Medicine

## 2019-06-28 NOTE — Chronic Care Management (AMB) (Signed)
  Chronic Care Management   Note  06/28/2019 Name: Kyle Vargas [employer]. MRN: [id number] DOB: 09-Jan-1941  Kyle Vargas. is a 78 y.o. year old male who is a primary care patient of [name], DO. I reached out to [employer]. by phone today in response to a referral sent by Mr. Kyle Oien Matousek Jr.'s health plan.     Kyle Vargas was given information about Chronic Care Management services today including:  1. CCM service includes personalized support from designated clinical staff supervised by his physician, including individualized plan of care and coordination with other care providers 2. 24/7 contact phone numbers for assistance for urgent and routine care needs. 3. Service will only be billed when office clinical staff spend 20 minutes or more in a month to coordinate care. 4. Only one practitioner may furnish and bill the service in a calendar month. 5. The patient may stop CCM services at any time (effective at the end of the month) by phone call to the office staff. 6. The patient will be responsible for cost sharing (co-pay) of up to 20% of the service fee (after annual deductible is met).  Patient agreed to services and verbal consent obtained.   Follow up plan: Telephone appointment with care management team member scheduled for:08/01/2019  Noreene Larsson, [place], [place], [place] [address] Direct Dial: [phone] [email or web address]: [email or web address]

## 2019-07-08 LAB — PSA: PSA: 2.3

## 2019-07-09 ENCOUNTER — Other Ambulatory Visit: Payer: Self-pay

## 2019-07-09 ENCOUNTER — Ambulatory Visit (INDEPENDENT_AMBULATORY_CARE_PROVIDER_SITE_OTHER): Payer: Medicare Other | Admitting: Family Medicine

## 2019-07-09 ENCOUNTER — Encounter: Payer: Self-pay | Admitting: Family Medicine

## 2019-07-09 VITALS — BP 165/75 | HR 76 | Ht 68.0 in | Wt 178.0 lb

## 2019-07-09 DIAGNOSIS — K625 Hemorrhage of anus and rectum: Secondary | ICD-10-CM

## 2019-07-09 DIAGNOSIS — K648 Other hemorrhoids: Secondary | ICD-10-CM | POA: Diagnosis not present

## 2019-07-09 NOTE — Progress Notes (Signed)
Virtual Visit via Telephone The purpose of this virtual visit is to provide medical care while limiting exposure to the novel coronavirus (COVID19) for both patient and office staff.  Consent was obtained for phone visit:  Yes.   Answered questions that patient had about telehealth interaction:  Yes.   I discussed the limitations, risks, security and privacy concerns of performing an evaluation and management service by telephone. I also discussed with the patient that there may be a patient responsible charge related to this service. The patient expressed understanding and agreed to proceed.  Patient Location: Home Provider Location: [facility])  ---------------------------------------------------------------------- Chief Complaint  Patient presents with  . Rectal Bleeding    onset yesterday noticed a lot of blood     S: Reviewed CMA documentation. I have called patient and gathered additional HPI as follows:  Rectal Bleeding Reports that symptoms started last night, first episode bright red blood in toilet bowel with BM, not mixed in, not dark blood, did not have any rectal or abdominal pain, with some increased gas production and belching. Admits some mild ache on R side. He has not had any prior rectal bleeding episodes. Today his stool was back to normal, no blood. - Last Colonoscopy 02/08/17 Dr Vira Agar, documented internal hemorrhoids at that time, also had polyps. - He takes ASA 81mg  daily - Denies any constipation straining with BM or diarrhea, dark stools  Denies any high risk travel to areas of current concern for COVID19. Denies any known or suspected exposure to person with or possibly with COVID19.  Denies any fevers, chills, sweats, body ache, cough, shortness of breath, sinus pain or pressure, headache, abdominal pain, diarrhea  Past Medical History:  Diagnosis Date  . Allergic rhinitis, cause unspecified   . Diarrhea   . Dysrhythmia   .  Esophageal reflux   . H/O diverticulitis of colon   . History of kidney stones   . PVC's (premature ventricular contractions)    Right ventricular outflow tract   Social History   Tobacco Use  . Smoking status: Never Smoker  . Smokeless tobacco: Never Used  Substance Use Topics  . Alcohol use: Yes    Comment: occasionally  2 glasses of wine a month   . Drug use: No    Current Outpatient Medications:  .  aspirin 81 MG tablet, Take 81 mg by mouth daily. Takes 5 days a week, Disp: , Rfl:  .  azelastine (ASTELIN) 0.1 % nasal spray, Place 1 spray into both nostrils 2 (two) times daily. , Disp: , Rfl:  .  Blood Pressure Monitoring (BLOOD PRESSURE CUFF) MISC, 1 Device by Does not apply route daily. Normal upper arm BP cuff, Disp: 1 each, Rfl: 0 .  BROMSITE 0.075 % SOLN, 2 (two) times daily. , Disp: , Rfl:  .  diltiazem (CARDIZEM CD) 240 MG 24 hr capsule, Take 1 capsule (240 mg total) by mouth every morning., Disp: 90 capsule, Rfl: 3 .  Garlic 10 MG CAPS, Take by mouth. Unsure of dose, Disp: , Rfl:  .  glucose blood test strip, ONETOUCH ULTRA BLUE (In Vitro Strip)  1 (one) Strip Strip check blood sugar once daily for 0 days  Quantity: 50;  Refills: 12   Ordered :02-Oct-2013  [name];  Started 03-Jul-2013 Active, Disp: , Rfl:  .  glucose blood test strip, Use as instructed, Disp: 100 each, Rfl: 12 .  hydrochlorothiazide (HYDRODIURIL) 25 MG tablet, Take 1 tablet (25 mg total)  by mouth daily., Disp: 90 tablet, Rfl: 3 .  losartan (COZAAR) 100 MG tablet, Take 1 tablet (100 mg total) by mouth daily., Disp: 90 tablet, Rfl: 1 .  metoprolol tartrate (LOPRESSOR) 25 MG tablet, Take 1.5 tabs twice daily, Disp: 270 tablet, Rfl: 1 .  Omega-3 Fatty Acids (OMEGA-3 FISH OIL) 1200 MG CAPS, Take 1 capsule (1,200 mg total) by mouth 3 (three) times daily., Disp: 90 capsule, Rfl: 0 .  Sodium Chloride-Sodium Bicarb (AYR SALINE NASAL NETI RINSE) 1.57 G PACK, Place into the nose as needed. , Disp: , Rfl:  .   loratadine (CLARITIN) 10 MG tablet, Take 10 mg by mouth daily., Disp: , Rfl:   Depression screen [facility] 2/9 07/09/2019 12/25/2018 02/21/2018  Decreased Interest 0 0 0  Down, Depressed, Hopeless 0 0 0  PHQ - 2 Score 0 0 0  Altered sleeping - - -  Tired, decreased energy - - -  Change in appetite - - -  Feeling bad or failure about yourself  - - -  Trouble concentrating - - -  Suicidal thoughts - - -  PHQ-9 Score - - -  Difficult doing work/chores - - -    No flowsheet data found.  -------------------------------------------------------------------------- O: No physical exam performed due to remote telephone encounter.  Lab results reviewed.  No results found for this or any previous visit (from the past 2160 hour(s)).  -------------------------------------------------------------------------- A&P:  Problem List Items Addressed This Visit    None    Visit Diagnoses    Rectal bleeding    -  Primary   Internal hemorrhoid, bleeding         Clinically consistent history with rectal bleeding BRBPR x 1 episode moderate amount likely from internal hemorrhoids Otherwise asymptomatic. No constipation or straining. No rectal pain or abdominal pain or other concerning GI symptoms Last colonoscopy 02/2017, documented internal hemorrhoids Reassurance today Monitor closerly If small episode x 1 only - then it stops, no further treatment If recurrent episodes 2 or more again, or larger bleeding amount, then we can refer to GI specialist  No orders of the defined types were placed in this encounter.   Follow-up: - Return as needed  Patient verbalizes understanding with the above medical recommendations including the limitation of remote medical advice.  Specific follow-up and call-back criteria were given for patient to follow-up or seek medical care more urgently if needed.   - Time spent in direct consultation with patient on phone: 7 minutes  Nobie Putnam, [facility] 07/09/2019, 9:44 AM

## 2019-07-09 NOTE — Patient Instructions (Addendum)
Reassuring Rectal bleeding as you described is likely from internal hemorrhoids, usually painless bleeding Monitor this closely for now If small episode x 1 only - then it stops, no further treatment If recurrent episodes 2 or more again, or larger bleeding amount, then we can refer to GI specialist  Please schedule a Follow-up Appointment to: Return if symptoms worsen or fail to improve.  If you have any other questions or concerns, please feel free to call the office or send a message through [place]. You may also schedule an earlier appointment if necessary.  Additionally, you may be receiving a survey about your experience at our office within a few days to 1 week by e-mail or mail. We value your feedback.  Nobie Putnam, DO [place]

## 2019-07-29 DIAGNOSIS — M5489 Other dorsalgia: Secondary | ICD-10-CM | POA: Diagnosis not present

## 2019-07-29 DIAGNOSIS — R310 Gross hematuria: Secondary | ICD-10-CM | POA: Diagnosis not present

## 2019-08-01 ENCOUNTER — Telehealth: Payer: Medicare Other | Admitting: General Practice

## 2019-08-01 ENCOUNTER — Ambulatory Visit (INDEPENDENT_AMBULATORY_CARE_PROVIDER_SITE_OTHER): Payer: Medicare Other | Admitting: General Practice

## 2019-08-01 DIAGNOSIS — E782 Mixed hyperlipidemia: Secondary | ICD-10-CM

## 2019-08-01 DIAGNOSIS — R7303 Prediabetes: Secondary | ICD-10-CM

## 2019-08-01 DIAGNOSIS — I10 Essential (primary) hypertension: Secondary | ICD-10-CM | POA: Diagnosis not present

## 2019-08-01 NOTE — Chronic Care Management (AMB) (Signed)
Chronic Care Management   Initial Visit Note  08/01/2019 Name: Kyle Vargas [employer]. MRN: [id number] DOB: [birth year]  Referred by: [name], DO Reason for referral : Chronic Care Management (Initial: Chronic Disease Management and Care coordination needs)   Kyle Vargas. is a 79 y.o. year old male who is a primary care patient of [name], DO. The CCM team was consulted for assistance with chronic disease management and care coordination needs related to HTN, HLD and pre-diabetic  Review of patient status, including review of consultants reports, relevant laboratory and other test results, and collaboration with appropriate care team members and the patient's provider was performed as part of comprehensive patient evaluation and provision of chronic care management services.    SDOH (Social Determinants of Health) assessments performed: Yes See Care Plan activities for detailed interventions related to SDOH     Medications: Outpatient Encounter Medications as of 08/01/2019  Medication Sig Note   aspirin 81 MG tablet Take 81 mg by mouth daily. Takes 5 days a week 12/13/2016: Currently not taking d/t procedure    azelastine (ASTELIN) 0.1 % nasal spray Place 1 spray into both nostrils 2 (two) times daily.     Blood Pressure Monitoring (BLOOD PRESSURE CUFF) MISC 1 Device by Does not apply route daily. Normal upper arm BP cuff    diltiazem (CARDIZEM CD) 240 MG 24 hr capsule Take 1 capsule (240 mg total) by mouth every morning.    Garlic 10 MG CAPS Take by mouth. Unsure of dose    glucose blood test strip ONETOUCH ULTRA BLUE (In Vitro Strip)  1 (one) Strip Strip check blood sugar once daily for 0 days  Quantity: 50;  Refills: 12   Ordered :02-Oct-2013  [name];  Started 03-Jul-2013 Active 08/09/2014: Received from: [facility]   glucose blood test strip Use as instructed    losartan (COZAAR) 100 MG tablet Take 1 tablet (100 mg  total) by mouth daily.    metoprolol tartrate (LOPRESSOR) 25 MG tablet Take 1.5 tabs twice daily    Omega-3 Fatty Acids (OMEGA-3 FISH OIL) 1200 MG CAPS Take 1 capsule (1,200 mg total) by mouth 3 (three) times daily. 12/25/2018: 3632m daily    Sodium Chloride-Sodium Bicarb (AYR SALINE NASAL NETI RINSE) 1.57 G PACK Place into the nose as needed.  12/25/2018: As needed    BROMSITE 0.075 % SOLN 2 (two) times daily.     hydrochlorothiazide (HYDRODIURIL) 25 MG tablet Take 1 tablet (25 mg total) by mouth daily.    loratadine (CLARITIN) 10 MG tablet Take 10 mg by mouth daily.    No facility-administered encounter medications on file as of 08/01/2019.     Objective:   Goals Addressed            This Visit's Progress    RNCM: [name] (pt-stated)       CARE PLAN ENTRY (see longtitudinal plan of care for additional care plan information)  Current Barriers:   Chronic Disease Management support, education, and care coordination needs related to HTN, HLD, and pre-diabetic  Clinical Goal(s) related to HTN, HLD, and pre-diabetic :  Over the next 120 days, patient will:   Work with the care management team to address educational, disease management, and care coordination needs   Begin or continue self health monitoring activities as directed today Measure and record cbg (blood glucose) 1 times weekly or as needed for change in condition,  Measure and record blood pressure 4 times per week, and adhere to a Heart healthy diet  Call provider office for new or worsened signs and symptoms Blood glucose findings outside established parameters, Blood pressure findings outside established parameters, and New or worsened symptom related to HLD and other chronic health conditions  Call care management team with questions or concerns  Verbalize basic understanding of patient centered plan of care established today  Interventions related to HTN, HLD, and pre-diabetic :     Evaluation of current treatment plans and patient's adherence to plan as established by provider.  The patient states he feels he manages his health well. Saw urologist this week and also had an eye exam yesterday.   Assessed patient understanding of disease states  Assessed patient's education and care coordination needs: Denies any needs at this time. Stays away from salt.   Provided disease specific education to patient: Will send information on low sodium options by mychart and EMMI system  Collaborated with appropriate clinical care team members regarding patient needs- reviewed with the patient the CCM team pharmacist and LCSW- denies any needs at this time.  Patient Self Care Activities related to HTN, HLD, and pre-diabetic :   Patient is unable to independently self-manage chronic health conditions  Initial goal documentation         [name] was given information about Chronic Care Management services today including:  1. CCM service includes personalized support from designated clinical staff supervised by his physician, including individualized plan of care and coordination with other care providers 2. 24/7 contact phone numbers for assistance for urgent and routine care needs. 3. Service will only be billed when office clinical staff spend 20 minutes or more in a month to coordinate care. 4. Only one practitioner may furnish and bill the service in a calendar month. 5. The patient may stop CCM services at any time (effective at the end of the month) by phone call to the office staff. 6. The patient will be responsible for cost sharing (co-pay) of up to 20% of the service fee (after annual deductible is met).  Patient agreed to services and verbal consent obtained.   Plan:   The care management team will reach out to the patient again over the next 60 days.   [name], MSN, [place] Mobile: [phone]

## 2019-08-01 NOTE — Patient Instructions (Signed)
Visit Information  Goals Addressed            This Visit's Progress   . RNCM: [name] (pt-stated)       CARE PLAN ENTRY (see longtitudinal plan of care for additional care plan information)  Current Barriers:  . Chronic Disease Management support, education, and care coordination needs related to HTN, HLD, and pre-diabetic  Clinical Goal(s) related to HTN, HLD, and pre-diabetic :  Over the next 120 days, patient will:  . Work with the care management team to address educational, disease management, and care coordination needs  . Begin or continue self health monitoring activities as directed today Measure and record cbg (blood glucose) 1 times weekly or as needed for change in condition, Measure and record blood pressure 4 times per week, and adhere to a Heart healthy diet . Call provider office for new or worsened signs and symptoms Blood glucose findings outside established parameters, Blood pressure findings outside established parameters, and New or worsened symptom related to HLD and other chronic health conditions . Call care management team with questions or concerns . Verbalize basic understanding of patient centered plan of care established today  Interventions related to HTN, HLD, and pre-diabetic :  . Evaluation of current treatment plans and patient's adherence to plan as established by provider.  The patient states he feels he manages his health well. Saw urologist this week and also had an eye exam yesterday.  . Assessed patient understanding of disease states . Assessed patient's education and care coordination needs: Denies any needs at this time. Stays away from salt.  . Provided disease specific education to patient: Will send information on low sodium options by mychart and EMMI system . Collaborated with appropriate clinical care team members regarding patient needs- reviewed with the patient the CCM team pharmacist and LCSW- denies any  needs at this time.  Patient Self Care Activities related to HTN, HLD, and pre-diabetic :  . Patient is unable to independently self-manage chronic health conditions  Initial goal documentation        Kyle Vargas was given information about Chronic Care Management services today including:  1. CCM service includes personalized support from designated clinical staff supervised by his physician, including individualized plan of care and coordination with other care providers 2. 24/7 contact phone numbers for assistance for urgent and routine care needs. 3. Service will only be billed when office clinical staff spend 20 minutes or more in a month to coordinate care. 4. Only one practitioner may furnish and bill the service in a calendar month. 5. The patient may stop CCM services at any time (effective at the end of the month) by phone call to the office staff. 6. The patient will be responsible for cost sharing (co-pay) of up to 20% of the service fee (after annual deductible is met).  Patient agreed to services and verbal consent obtained.   Patient verbalizes understanding of instructions provided today.   The care management team will reach out to the patient again over the next 60 days.   [name], MSN, [facility] Mobile: [phone]   DASH Eating Plan DASH stands for "Dietary Approaches to Stop Hypertension." The DASH eating plan is a healthy eating plan that has been shown to reduce high blood pressure (hypertension). It may also reduce your risk for type 2 diabetes, heart disease, and stroke.  The DASH eating plan may also help with weight loss. What are tips for following this plan?  General guidelines  Avoid eating more than 2,300 mg (milligrams) of salt (sodium) a day. If you have hypertension, you may need to reduce your sodium intake to 1,500 mg a day.  Limit alcohol intake to no  more than 1 drink a day for nonpregnant women and 2 drinks a day for men. One drink equals 12 oz of beer, 5 oz of wine, or 1 oz of hard liquor.  Work with your health care provider to maintain a healthy body weight or to lose weight. Ask what an ideal weight is for you.  Get at least 30 minutes of exercise that causes your heart to beat faster (aerobic exercise) most days of the week. Activities may include walking, swimming, or biking.  Work with your health care provider or diet and nutrition specialist (dietitian) to adjust your eating plan to your individual calorie needs. Reading food labels   Check food labels for the amount of sodium per serving. Choose foods with less than 5 percent of the Daily Value of sodium. Generally, foods with less than 300 mg of sodium per serving fit into this eating plan.  To find whole grains, look for the word "whole" as the first word in the ingredient list. Shopping  Buy products labeled as "low-sodium" or "no salt added."  Buy fresh foods. Avoid canned foods and premade or frozen meals. Cooking  Avoid adding salt when cooking. Use salt-free seasonings or herbs instead of table salt or sea salt. Check with your health care provider or pharmacist before using salt substitutes.  Do not fry foods. Cook foods using healthy methods such as baking, boiling, grilling, and broiling instead.  Cook with heart-healthy oils, such as olive, canola, soybean, or sunflower oil. Meal planning  Eat a balanced diet that includes: ? 5 or more servings of fruits and vegetables each day. At each meal, try to fill half of your plate with fruits and vegetables. ? Up to 6-8 servings of whole grains each day. ? Less than 6 oz of lean meat, poultry, or fish each day. A 3-oz serving of meat is about the same size as a deck of cards. One egg equals 1 oz. ? 2 servings of low-fat dairy each day. ? A serving of nuts, seeds, or beans 5 times each week. ? Heart-healthy fats.  Healthy fats called Omega-3 fatty acids are found in foods such as flaxseeds and coldwater fish, like sardines, salmon, and mackerel.  Limit how much you eat of the following: ? Canned or prepackaged foods. ? Food that is high in trans fat, such as fried foods. ? Food that is high in saturated fat, such as fatty meat. ? Sweets, desserts, sugary drinks, and other foods with added sugar. ? Full-fat dairy products.  Do not salt foods before eating.  Try to eat at least 2 vegetarian meals each week.  Eat more home-cooked food and less restaurant, buffet, and fast food.  When eating at a restaurant, ask that your food be prepared with less salt or no salt, if possible. What foods are recommended? The items listed may not be a complete list. Talk with your dietitian about what dietary choices are best for you. Grains Whole-grain or whole-wheat bread. Whole-grain or whole-wheat pasta. Brown rice. [place]. Bulgur. Whole-grain and low-sodium cereals. Pita bread. Low-fat, low-sodium crackers. Whole-wheat flour tortillas. Vegetables Fresh or frozen vegetables (raw, steamed, roasted, or grilled).  Low-sodium or reduced-sodium tomato and vegetable juice. Low-sodium or reduced-sodium tomato sauce and tomato paste. Low-sodium or reduced-sodium canned vegetables. Fruits All fresh, dried, or frozen fruit. Canned fruit in natural juice (without added sugar). Meat and other protein foods Skinless chicken or [place]. Ground chicken or [place]. Pork with fat trimmed off. Fish and seafood. Egg whites. Dried beans, peas, or lentils. Unsalted nuts, nut butters, and seeds. Unsalted canned beans. Lean cuts of beef with fat trimmed off. Low-sodium, lean deli meat. Dairy Low-fat (1%) or fat-free (skim) milk. Fat-free, low-fat, or reduced-fat cheeses. Nonfat, low-sodium ricotta or cottage cheese. Low-fat or nonfat yogurt. Low-fat, low-sodium cheese. Fats and oils Soft margarine without trans fats. Vegetable  oil. Low-fat, reduced-fat, or light mayonnaise and salad dressings (reduced-sodium). Canola, safflower, olive, soybean, and sunflower oils. Avocado. Seasoning and other foods Herbs. Spices. Seasoning mixes without salt. Unsalted popcorn and pretzels. Fat-free sweets. What foods are not recommended? The items listed may not be a complete list. Talk with your dietitian about what dietary choices are best for you. Grains Baked goods made with fat, such as croissants, muffins, or some breads. Dry pasta or rice meal packs. Vegetables Creamed or fried vegetables. Vegetables in a cheese sauce. Regular canned vegetables (not low-sodium or reduced-sodium). Regular canned tomato sauce and paste (not low-sodium or reduced-sodium). Regular tomato and vegetable juice (not low-sodium or reduced-sodium). Angie Fava. Olives. Fruits Canned fruit in a light or heavy syrup. Fried fruit. Fruit in cream or butter sauce. Meat and other protein foods Fatty cuts of meat. Ribs. Fried meat. Berniece Salines. Sausage. Bologna and other processed lunch meats. Salami. Fatback. Hotdogs. Bratwurst. Salted nuts and seeds. Canned beans with added salt. Canned or smoked fish. Whole eggs or egg yolks. Chicken or [place] with skin. Dairy Whole or 2% milk, cream, and half-and-half. Whole or full-fat cream cheese. Whole-fat or sweetened yogurt. Full-fat cheese. Nondairy creamers. Whipped toppings. Processed cheese and cheese spreads. Fats and oils Butter. Stick margarine. Lard. Shortening. Ghee. Bacon fat. Tropical oils, such as coconut, palm kernel, or palm oil. Seasoning and other foods Salted popcorn and pretzels. Onion salt, garlic salt, seasoned salt, table salt, and sea salt. Worcestershire sauce. Tartar sauce. Barbecue sauce. Teriyaki sauce. Soy sauce, including reduced-sodium. Steak sauce. Canned and packaged gravies. Fish sauce. Oyster sauce. Cocktail sauce. Horseradish that you find on the shelf. Ketchup. Mustard. Meat flavorings and  tenderizers. Bouillon cubes. Hot sauce and Tabasco sauce. Premade or packaged marinades. Premade or packaged taco seasonings. Relishes. Regular salad dressings. Where to find more information:  National Heart, Lung, and [place]: https://wilson-eaton.com/  American Heart Association: www.heart.org Summary  The DASH eating plan is a healthy eating plan that has been shown to reduce high blood pressure (hypertension). It may also reduce your risk for type 2 diabetes, heart disease, and stroke.  With the DASH eating plan, you should limit salt (sodium) intake to 2,300 mg a day. If you have hypertension, you may need to reduce your sodium intake to 1,500 mg a day.  When on the DASH eating plan, aim to eat more fresh fruits and vegetables, whole grains, lean proteins, low-fat dairy, and heart-healthy fats.  Work with your health care provider or diet and nutrition specialist (dietitian) to adjust your eating plan to your individual calorie needs. This information is not intended to replace advice given to you by your health care provider. Make sure you discuss any questions you have with your health care provider. Document Revised: 04/07/2017 Document Reviewed: 04/18/2016 Elsevier Patient Education  [phone]  [place].  Low-Sodium Eating Plan Sodium, which is an element that makes up salt, helps you maintain a healthy balance of fluids in your body. Too much sodium can increase your blood pressure and cause fluid and waste to be held in your body. Your health care provider or dietitian may recommend following this plan if you have high blood pressure (hypertension), kidney disease, liver disease, or heart failure. Eating less sodium can help lower your blood pressure, reduce swelling, and protect your heart, liver, and kidneys. What are tips for following this plan? General guidelines  Most people on this plan should limit their sodium intake to 1,500-2,000 mg (milligrams) of sodium each  day. Reading food labels   The Nutrition Facts label lists the amount of sodium in one serving of the food. If you eat more than one serving, you must multiply the listed amount of sodium by the number of servings.  Choose foods with less than 140 mg of sodium per serving.  Avoid foods with 300 mg of sodium or more per serving. Shopping  Look for lower-sodium products, often labeled as "low-sodium" or "no salt added."  Always check the sodium content even if foods are labeled as "unsalted" or "no salt added".  Buy fresh foods. ? Avoid canned foods and premade or frozen meals. ? Avoid canned, cured, or processed meats  Buy breads that have less than 80 mg of sodium per slice. Cooking  Eat more home-cooked food and less restaurant, buffet, and fast food.  Avoid adding salt when cooking. Use salt-free seasonings or herbs instead of table salt or sea salt. Check with your health care provider or pharmacist before using salt substitutes.  Cook with plant-based oils, such as canola, sunflower, or olive oil. Meal planning  When eating at a restaurant, ask that your food be prepared with less salt or no salt, if possible.  Avoid foods that contain MSG (monosodium glutamate). MSG is sometimes added to [place] food, bouillon, and some canned foods. What foods are recommended? The items listed may not be a complete list. Talk with your dietitian about what dietary choices are best for you. Grains Low-sodium cereals, including oats, puffed wheat and rice, and shredded wheat. Low-sodium crackers. Unsalted rice. Unsalted pasta. Low-sodium bread. Whole-grain breads and whole-grain pasta. Vegetables Fresh or frozen vegetables. "No salt added" canned vegetables. "No salt added" tomato sauce and paste. Low-sodium or reduced-sodium tomato and vegetable juice. Fruits Fresh, frozen, or canned fruit. Fruit juice. Meats and other protein foods Fresh or frozen (no salt added) meat, poultry, seafood,  and fish. Low-sodium canned tuna and salmon. Unsalted nuts. Dried peas, beans, and lentils without added salt. Unsalted canned beans. Eggs. Unsalted nut butters. Dairy Milk. Soy milk. Cheese that is naturally low in sodium, such as ricotta cheese, fresh mozzarella, or Swiss cheese Low-sodium or reduced-sodium cheese. Cream cheese. Yogurt. Fats and oils Unsalted butter. Unsalted margarine with no trans fat. Vegetable oils such as canola or olive oils. Seasonings and other foods Fresh and dried herbs and spices. Salt-free seasonings. Low-sodium mustard and ketchup. Sodium-free salad dressing. Sodium-free light mayonnaise. Fresh or refrigerated horseradish. Lemon juice. Vinegar. Homemade, reduced-sodium, or low-sodium soups. Unsalted popcorn and pretzels. Low-salt or salt-free chips. What foods are not recommended? The items listed may not be a complete list. Talk with your dietitian about what dietary choices are best for you. Grains Instant hot cereals. Bread stuffing, pancake, and biscuit mixes. Croutons. Seasoned rice or pasta mixes. Noodle soup cups. Boxed or frozen macaroni  and cheese. Regular salted crackers. Self-rising flour. Vegetables Sauerkraut, pickled vegetables, and relishes. Olives. [place] fries. Onion rings. Regular canned vegetables (not low-sodium or reduced-sodium). Regular canned tomato sauce and paste (not low-sodium or reduced-sodium). Regular tomato and vegetable juice (not low-sodium or reduced-sodium). Frozen vegetables in sauces. Meats and other protein foods Meat or fish that is salted, canned, smoked, spiced, or pickled. Bacon, ham, sausage, hotdogs, corned beef, chipped beef, packaged lunch meats, salt pork, jerky, pickled herring, anchovies, regular canned tuna, sardines, salted nuts. Dairy Processed cheese and cheese spreads. Cheese curds. Blue cheese. Feta cheese. String cheese. Regular cottage cheese. Buttermilk. Canned milk. Fats and oils Salted butter. Regular  margarine. Ghee. Bacon fat. Seasonings and other foods Onion salt, garlic salt, seasoned salt, table salt, and sea salt. Canned and packaged gravies. Worcestershire sauce. Tartar sauce. Barbecue sauce. Teriyaki sauce. Soy sauce, including reduced-sodium. Steak sauce. Fish sauce. Oyster sauce. Cocktail sauce. Horseradish that you find on the shelf. Regular ketchup and mustard. Meat flavorings and tenderizers. Bouillon cubes. Hot sauce and Tabasco sauce. Premade or packaged marinades. Premade or packaged taco seasonings. Relishes. Regular salad dressings. Salsa. Potato and tortilla chips. Corn chips and puffs. Salted popcorn and pretzels. Canned or dried soups. Pizza. Frozen entrees and pot pies. Summary  Eating less sodium can help lower your blood pressure, reduce swelling, and protect your heart, liver, and kidneys.  Most people on this plan should limit their sodium intake to 1,500-2,000 mg (milligrams) of sodium each day.  Canned, boxed, and frozen foods are high in sodium. Restaurant foods, fast foods, and pizza are also very high in sodium. You also get sodium by adding salt to food.  Try to cook at home, eat more fresh fruits and vegetables, and eat less fast food, canned, processed, or prepared foods. This information is not intended to replace advice given to you by your health care provider. Make sure you discuss any questions you have with your health care provider. Document Revised: 04/07/2017 Document Reviewed: 04/18/2016 Elsevier Patient Education  2020 [employer].

## 2019-08-05 DIAGNOSIS — H26493 Other secondary cataract, bilateral: Secondary | ICD-10-CM | POA: Diagnosis not present

## 2019-08-07 ENCOUNTER — Other Ambulatory Visit: Payer: Self-pay | Admitting: Family Medicine

## 2019-08-07 DIAGNOSIS — I10 Essential (primary) hypertension: Secondary | ICD-10-CM

## 2019-08-14 DIAGNOSIS — C4441 Basal cell carcinoma of skin of scalp and neck: Secondary | ICD-10-CM | POA: Diagnosis not present

## 2019-08-14 DIAGNOSIS — L570 Actinic keratosis: Secondary | ICD-10-CM | POA: Diagnosis not present

## 2019-08-14 DIAGNOSIS — D485 Neoplasm of uncertain behavior of skin: Secondary | ICD-10-CM | POA: Diagnosis not present

## 2019-08-14 DIAGNOSIS — Z7189 Other specified counseling: Secondary | ICD-10-CM | POA: Diagnosis not present

## 2019-08-14 DIAGNOSIS — Z85828 Personal history of other malignant neoplasm of skin: Secondary | ICD-10-CM | POA: Diagnosis not present

## 2019-09-24 DIAGNOSIS — L814 Other melanin hyperpigmentation: Secondary | ICD-10-CM | POA: Diagnosis not present

## 2019-09-24 DIAGNOSIS — C44712 Basal cell carcinoma of skin of right lower limb, including hip: Secondary | ICD-10-CM | POA: Diagnosis not present

## 2019-09-24 DIAGNOSIS — L578 Other skin changes due to chronic exposure to nonionizing radiation: Secondary | ICD-10-CM | POA: Diagnosis not present

## 2019-09-24 DIAGNOSIS — D045 Carcinoma in situ of skin of trunk: Secondary | ICD-10-CM | POA: Diagnosis not present

## 2019-09-24 DIAGNOSIS — C4441 Basal cell carcinoma of skin of scalp and neck: Secondary | ICD-10-CM | POA: Diagnosis not present

## 2019-10-03 ENCOUNTER — Telehealth: Payer: Self-pay | Admitting: General Practice

## 2019-10-03 ENCOUNTER — Ambulatory Visit (INDEPENDENT_AMBULATORY_CARE_PROVIDER_SITE_OTHER): Payer: Medicare Other | Admitting: General Practice

## 2019-10-03 DIAGNOSIS — E782 Mixed hyperlipidemia: Secondary | ICD-10-CM | POA: Diagnosis not present

## 2019-10-03 DIAGNOSIS — I10 Essential (primary) hypertension: Secondary | ICD-10-CM

## 2019-10-03 DIAGNOSIS — R7303 Prediabetes: Secondary | ICD-10-CM

## 2019-10-03 NOTE — Chronic Care Management (AMB) (Signed)
Chronic Care Management   Follow Up Note   10/03/2019 Name: Kyle Barentine Hahne Jr. MRN: [id number] DOB: 12/09/1940  Referred by: Kyle Hauser, DO Reason for referral : Chronic Care Management (Follow up: HTN/HLD- pre-diabetes and care coordination needs)   [name] is a 79 y.o. year old male who is a primary care patient of Kyle Hauser, DO. The CCM team was consulted for assistance with chronic disease management and care coordination needs.    Review of patient status, including review of consultants reports, relevant laboratory and other test results, and collaboration with appropriate care team members and the patient's provider was performed as part of comprehensive patient evaluation and provision of chronic care management services.    SDOH (Social Determinants of Health) assessments performed: Yes- exercise See Care Plan activities for detailed interventions related to [facility])     Outpatient Encounter Medications as of 10/03/2019  Medication Sig Note  . aspirin 81 MG tablet Take 81 mg by mouth daily. Takes 5 days a week 12/13/2016: Currently not taking d/t procedure   . azelastine (ASTELIN) 0.1 % nasal spray Place 1 spray into both nostrils 2 (two) times daily.    . Blood Pressure Monitoring (BLOOD PRESSURE CUFF) MISC 1 Device by Does not apply route daily. Normal upper arm BP cuff   . BROMSITE 0.075 % SOLN 2 (two) times daily.    Marland Kitchen diltiazem (CARDIZEM CD) 240 MG 24 hr capsule Take 1 capsule (240 mg total) by mouth every morning.   . Garlic 10 MG CAPS Take by mouth. Unsure of dose   . glucose blood test strip ONETOUCH ULTRA BLUE (In Vitro Strip)  1 (one) Strip Strip check blood sugar once daily for 0 days  Quantity: 50;  Refills: 12   Ordered :02-Oct-2013  Kyle Vargas;  Started 03-Jul-2013 Active 08/09/2014: Received from: [facility]  . glucose blood test strip Use as instructed   . hydrochlorothiazide (HYDRODIURIL) 25 MG tablet  Take 1 tablet (25 mg total) by mouth daily.   Marland Kitchen loratadine (CLARITIN) 10 MG tablet Take 10 mg by mouth daily.   Marland Kitchen losartan (COZAAR) 100 MG tablet Take 1 tablet (100 mg total) by mouth daily.   . metoprolol tartrate (LOPRESSOR) 25 MG tablet Take 1.5 tabs twice daily   . Omega-3 Fatty Acids (OMEGA-3 FISH OIL) 1200 MG CAPS Take 1 capsule (1,200 mg total) by mouth 3 (three) times daily. 12/25/2018: 3600mg  daily   . Sodium Chloride-Sodium Bicarb (AYR SALINE NASAL NETI RINSE) 1.57 G PACK Place into the nose as needed.  12/25/2018: As needed    No facility-administered encounter medications on file as of 10/03/2019.     Objective:   BP Readings from Last 3 Encounters:  07/29/19 130/82  07/09/19 (!) 165/75  02/19/19 138/60   Goals Addressed            This Visit's Progress   . RNCM: [name] (pt-stated)       CARE PLAN ENTRY (see longtitudinal plan of care for additional care plan information)  Current Barriers:  . Chronic Disease Management support, education, and care coordination needs related to HTN, HLD, and pre-diabetic  Clinical Goal(s) related to HTN, HLD, and pre-diabetic :  Over the next 120 days, patient will:  . Work with the care management team to address educational, disease management, and care coordination needs  . Begin or continue self health monitoring activities as directed today Measure  and record cbg (blood glucose) 1 times weekly or as needed for change in condition, Measure and record blood pressure 4 times per week, and adhere to a Heart healthy diet . Call provider office for new or worsened signs and symptoms Blood glucose findings outside established parameters, Blood pressure findings outside established parameters, and New or worsened symptom related to HLD and other chronic health conditions . Call care management team with questions or concerns . Verbalize basic understanding of patient centered plan of care established  today  Interventions related to HTN, HLD, and pre-diabetic :  . Evaluation of current treatment plans and patient's adherence to plan as established by provider.  The patient states he feels he manages his health well. Saw urologist this week and also had an eye exam yesterday. A little over a week ago he had basal cell carcinoma and squamous cell carcinoma removed from his neck, leg and back. He said the leg and back was just a scraping and his neck about an inch long and has stitches.  He is keeping salve on the area and watching. Denies any s/s of infection.  . Assessed patient understanding of disease states.  The patient has a good understanding of his disease processes. Is concerned because he is pre-diabetic but he tries to watch his dietary restrictions. The patient denies any concerns at this time.  . Evaluation of blood pressure readings. The patient states systolic his blood pressures are [id number] and diastolic [id number].  His blood sugars are in 110 range. He states he has gained about 5 pounds but still is exercising at least 5 days a week depending on the weather and walking. The patient feels he is doing well managing his health.  . Assessed patient's education and care coordination needs: Denies any needs at this time. Stays away from salt. Uses a lot of pepper.  . Provided disease specific education to patient: Will send information on low sodium options by mychart and EMMI system . Collaborated with appropriate clinical care team members regarding patient needs- reviewed with the patient the CCM team pharmacist and LCSW- denies any needs at this time.  Patient Self Care Activities related to HTN, HLD, and pre-diabetic :  . Patient is unable to independently self-manage chronic health conditions  Please see past updates related to this goal by clicking on the "Past Updates" button in the selected goal          Plan:   The care management team will reach out to the patient again over  the next 60 days.    Kyle Larsson RN, MSN, [facility] Mobile: [phone]

## 2019-10-03 NOTE — Patient Instructions (Signed)
Visit Information  Goals Addressed            This Visit's Progress   . RNCM: [name] (pt-stated)       CARE PLAN ENTRY (see longtitudinal plan of care for additional care plan information)  Current Barriers:  . Chronic Disease Management support, education, and care coordination needs related to HTN, HLD, and pre-diabetic  Clinical Goal(s) related to HTN, HLD, and pre-diabetic :  Over the next 120 days, patient will:  . Work with the care management team to address educational, disease management, and care coordination needs  . Begin or continue self health monitoring activities as directed today Measure and record cbg (blood glucose) 1 times weekly or as needed for change in condition, Measure and record blood pressure 4 times per week, and adhere to a Heart healthy diet . Call provider office for new or worsened signs and symptoms Blood glucose findings outside established parameters, Blood pressure findings outside established parameters, and New or worsened symptom related to HLD and other chronic health conditions . Call care management team with questions or concerns . Verbalize basic understanding of patient centered plan of care established today  Interventions related to HTN, HLD, and pre-diabetic :  . Evaluation of current treatment plans and patient's adherence to plan as established by provider.  The patient states he feels he manages his health well. Saw urologist this week and also had an eye exam yesterday. A little over a week ago he had basal cell carcinoma and squamous cell carcinoma removed from his neck, leg and back. He said the leg and back was just a scraping and his neck about an inch long and has stitches.  He is keeping salve on the area and watching. Denies any s/s of infection.  . Assessed patient understanding of disease states.  The patient has a good understanding of his disease processes. Is concerned because he is pre-diabetic  but he tries to watch his dietary restrictions. The patient denies any concerns at this time.  . Evaluation of blood pressure readings. The patient states systolic his blood pressures are [id number] and diastolic [id number].  His blood sugars are in 110 range. He states he has gained about 5 pounds but still is exercising at least 5 days a week depending on the weather and walking. The patient feels he is doing well managing his health.  . Assessed patient's education and care coordination needs: Denies any needs at this time. Stays away from salt. Uses a lot of pepper.  . Provided disease specific education to patient: Will send information on low sodium options by mychart and EMMI system . Collaborated with appropriate clinical care team members regarding patient needs- reviewed with the patient the CCM team pharmacist and LCSW- denies any needs at this time.  Patient Self Care Activities related to HTN, HLD, and pre-diabetic :  . Patient is unable to independently self-manage chronic health conditions  Please see past updates related to this goal by clicking on the "Past Updates" button in the selected goal         Patient verbalizes understanding of instructions provided today.   The care management team will reach out to the patient again over the next 60 days.   [name], MSN, [place] Mobile: [phone]

## 2019-10-11 ENCOUNTER — Other Ambulatory Visit: Payer: Self-pay | Admitting: Family Medicine

## 2019-10-11 DIAGNOSIS — I493 Ventricular premature depolarization: Secondary | ICD-10-CM

## 2019-10-11 DIAGNOSIS — I10 Essential (primary) hypertension: Secondary | ICD-10-CM

## 2019-10-14 ENCOUNTER — Other Ambulatory Visit: Payer: Medicare Other

## 2019-10-14 ENCOUNTER — Other Ambulatory Visit: Payer: Self-pay

## 2019-10-14 DIAGNOSIS — Z0000 Encounter for general adult medical examination without abnormal findings: Secondary | ICD-10-CM | POA: Diagnosis not present

## 2019-10-14 DIAGNOSIS — E782 Mixed hyperlipidemia: Secondary | ICD-10-CM | POA: Diagnosis not present

## 2019-10-14 DIAGNOSIS — R7303 Prediabetes: Secondary | ICD-10-CM

## 2019-10-14 DIAGNOSIS — I10 Essential (primary) hypertension: Secondary | ICD-10-CM | POA: Diagnosis not present

## 2019-10-15 LAB — COMPLETE METABOLIC PANEL WITH GFR
AG Ratio: 1.8 (calc) (ref 1.0–2.5)
ALT: 36 U/L (ref 9–46)
AST: 22 U/L (ref 10–35)
Albumin: 4.1 g/dL (ref 3.6–5.1)
Alkaline phosphatase (APISO): 86 U/L (ref 35–144)
BUN: 16 mg/dL (ref 7–25)
CO2: 30 mmol/L (ref 20–32)
Calcium: 9.3 mg/dL (ref 8.6–10.3)
Chloride: 106 mmol/L (ref 98–110)
Creat: 1.06 mg/dL (ref 0.70–1.18)
GFR, Est African American: 78 mL/min/{1.73_m2} (ref >=60)
GFR, Est Non African American: 67 mL/min/{1.73_m2} (ref >=60)
Globulin: 2.3 g/dL (calc) (ref 1.9–3.7)
Glucose, Bld: 116 mg/dL — ABNORMAL HIGH (ref 65–99)
Potassium: 3.9 mmol/L (ref 3.5–5.3)
Sodium: 142 mmol/L (ref 135–146)
Total Bilirubin: 0.4 mg/dL (ref 0.2–1.2)
Total Protein: 6.4 g/dL (ref 6.1–8.1)

## 2019-10-15 LAB — CBC WITH DIFFERENTIAL/PLATELET
Absolute Monocytes: 495 cells/uL (ref 200–950)
Basophils Absolute: 33 cells/uL (ref 0–200)
Basophils Relative: 0.5 %
Eosinophils Absolute: 257 cells/uL (ref 15–500)
Eosinophils Relative: 3.9 %
HCT: 43.2 % (ref 38.5–50.0)
Hemoglobin: 14.4 g/dL (ref 13.2–17.1)
Lymphs Abs: 1571 cells/uL (ref 850–3900)
MCH: 32.4 pg (ref 27.0–33.0)
MCHC: 33.3 g/dL (ref 32.0–36.0)
MCV: 97.3 fL (ref 80.0–100.0)
MPV: 9.5 fL (ref 7.5–12.5)
Monocytes Relative: 7.5 %
Neutro Abs: 4244 cells/uL (ref 1500–7800)
Neutrophils Relative %: 64.3 %
Platelets: 191 10*3/uL (ref 140–400)
RBC: 4.44 10*6/uL (ref 4.20–5.80)
RDW: 13.9 % (ref 11.0–15.0)
Total Lymphocyte: 23.8 %
WBC: 6.6 10*3/uL (ref 3.8–10.8)

## 2019-10-15 LAB — HEMOGLOBIN A1C
Hgb A1c MFr Bld: 5.7 % of total Hgb — ABNORMAL HIGH (ref <5.7)
Mean Plasma Glucose: 117 (calc)
eAG (mmol/L): 6.5 (calc)

## 2019-10-15 LAB — LIPID PANEL
Cholesterol: 148 mg/dL (ref <200)
HDL: 38 mg/dL — ABNORMAL LOW (ref >=40)
LDL Cholesterol (Calc): 79 mg/dL (calc)
Non-HDL Cholesterol (Calc): 110 mg/dL (calc) (ref <130)
Total CHOL/HDL Ratio: 3.9 (calc) (ref <5.0)
Triglycerides: 226 mg/dL — ABNORMAL HIGH (ref <150)

## 2019-10-21 ENCOUNTER — Other Ambulatory Visit: Payer: Self-pay

## 2019-10-21 ENCOUNTER — Ambulatory Visit (INDEPENDENT_AMBULATORY_CARE_PROVIDER_SITE_OTHER): Payer: Medicare Other | Admitting: Family Medicine

## 2019-10-21 ENCOUNTER — Other Ambulatory Visit: Payer: Self-pay | Admitting: Family Medicine

## 2019-10-21 ENCOUNTER — Encounter: Payer: Self-pay | Admitting: Family Medicine

## 2019-10-21 VITALS — BP 134/63 | HR 69 | Temp 97.1°F | Resp 16 | Ht 68.0 in | Wt 175.6 lb

## 2019-10-21 DIAGNOSIS — E782 Mixed hyperlipidemia: Secondary | ICD-10-CM | POA: Diagnosis not present

## 2019-10-21 DIAGNOSIS — R7303 Prediabetes: Secondary | ICD-10-CM | POA: Diagnosis not present

## 2019-10-21 DIAGNOSIS — Z0000 Encounter for general adult medical examination without abnormal findings: Secondary | ICD-10-CM

## 2019-10-21 DIAGNOSIS — I10 Essential (primary) hypertension: Secondary | ICD-10-CM | POA: Diagnosis not present

## 2019-10-21 DIAGNOSIS — N138 Other obstructive and reflux uropathy: Secondary | ICD-10-CM

## 2019-10-21 DIAGNOSIS — N401 Enlarged prostate with lower urinary tract symptoms: Secondary | ICD-10-CM | POA: Diagnosis not present

## 2019-10-21 NOTE — Progress Notes (Signed)
Subjective:    Patient ID: Kyle Vargas., male    DOB: 07/28/1940, 79 y.o.   MRN: [id number]  Kyle Luecke Leone [name]. is a 79 y.o. male presenting on 10/21/2019 for Annual Exam   HPI  Here for Annual Physical and Lab Review.  Pre-Diabetes Lab shows A1c 5.7 improved CBGs: Avg100-120, Low none, High none. Checks CBGs x 2-3weeks Meds: Never Currently on ARB Lifestyle: Weight down 3-4 lbs - Diet (improved low carb) - Exercise (walks 30 min daily 5 x weekly, works in garden, yard, active at home) Denies hypoglycemia  CHRONIC HTN/ Intermittent PVC - controlled. Followed by [facility] Kyle Vargas. Prior work up they checked Renal Artery [place] due to spikes or elevation in BP, results show normal without stenosis. Home BP improved Current Meds - Losartan 100mg , HCTZ25mg , Metoprolol 37.5mg  twice daily (25mg  tabs, 1.5 per dose), Diltiazem-24 hour 240mg  Reports good compliance, took meds today. Tolerating well, w/o complaints. - Taking ASA 81mg  x 5daysweekly, skips weekends. No known CAD or CVA - Followed by [facility] for PVC in past, controlled on medicines  HYPERLIPIDEMIA: - Reports concernswith chronic elevated TG for reportedly >20 years and fam history of same. Last lipid panel 10/2019 slightly elevated again TG but it is improved now to 226. - Currently takingFish Oil Omega 3, 1200- taking x 3 a day - he has not been on Statin or Fibrate in past by his report - he has been taking Garlique supplement OTC and also [facility] for squamous cell skin cancer  Health Maintenance:  BPH and Prostate CA Screening: Followed by [facility]. Prior PSA / DRE reported normal. Last PSA 2.3 (07/2019) from Kyle Vargas. Currently controlled BPH LUTS only mild. No known family history of prostate CA.   Last colonoscopy 02/08/17, [facility], polyp x 1 tubular adenoma, negative, advised to consider repeat in 5 years.  Otherwise may no longer need a repeat colonoscopy if age 45+   Depression screen [facility] 2/9 10/21/2019 08/01/2019 07/09/2019  Decreased Interest 0 0 0  Down, Depressed, Hopeless 0 0 0  PHQ - 2 Score 0 0 0  Altered sleeping - - -  Tired, decreased energy - - -  Change in appetite - - -  Feeling bad or failure about yourself  - - -  Trouble concentrating - - -  Suicidal thoughts - - -  PHQ-9 Score - - -  Difficult doing work/chores - - -    Past Medical History:  Diagnosis Date  . Allergic rhinitis, cause unspecified   . Diarrhea   . Dysrhythmia   . Esophageal reflux   . H/O diverticulitis of colon   . History of kidney stones   . PVC's (premature ventricular contractions)    Right ventricular outflow tract   Past Surgical History:  Procedure Laterality Date  . BASAL CELL CARCINOMA EXCISION    . CATARACT EXTRACTION, BILATERAL  2020  . COLONOSCOPY    . COLONOSCOPY WITH PROPOFOL N/A 02/08/2017   Procedure: COLONOSCOPY WITH PROPOFOL;  Surgeon: Kyle Vargas;  Location: [facility] ENDOSCOPY;  Service: Endoscopy;  Laterality: N/A;  . REPAIR IMPERFORATE ANUS / ANORECTOPLASTY    . TRANSURETHRAL RESECTION OF PROSTATE     Social History   Socioeconomic History  . Marital status: Married    Spouse name: Not on file  . Number of children: Not on file  . Years of education: Not on file  .  Highest education level: Some college, no degree  Occupational History  . Occupation: retired  Tobacco Use  . Smoking status: Never Smoker  . Smokeless tobacco: Never Used  Vaping Use  . Vaping Use: Never used  Substance and Sexual Activity  . Alcohol use: Yes    Comment: occasionally  2 glasses of wine a month   . Drug use: No  . Sexual activity: Not on file  Other Topics Concern  . Not on file  Social History Narrative  . Not on file   Social Determinants of Health   Financial Resource Strain: Low Risk   . Difficulty of Paying Living Expenses: Not hard at all  Food Insecurity: No  Food Insecurity  . Worried About Charity fundraiser in the Last Year: Never true  . Ran Out of Food in the Last Year: Never true  Transportation Needs: No Transportation Needs  . Lack of Transportation (Medical): No  . Lack of Transportation (Non-Medical): No  Physical Activity: Sufficiently Active  . Days of Exercise per Week: 5 days  . Minutes of Exercise per Session: 30 min  Stress: No Stress Concern Present  . Feeling of Stress : Not at all  Social Connections: Socially Integrated  . Frequency of Communication with Friends and Family: More than three times a week  . Frequency of Social Gatherings with Friends and Family: More than three times a week  . Attends Religious Services: More than 4 times per year  . Active Member of Clubs or Organizations: Yes  . Attends Archivist Meetings: More than 4 times per year  . Marital Status: Married  Human resources officer Violence: Not At Risk  . Fear of Current or Ex-Partner: No  . Emotionally Abused: No  . Physically Abused: No  . Sexually Abused: No   Family History  Problem Relation Age of Onset  . Hypertension Father    Current Outpatient Medications on File Prior to Visit  Medication Sig  . aspirin 81 MG tablet Take 81 mg by mouth daily. Takes 5 days a week  . azelastine (ASTELIN) 0.1 % nasal spray Place 1 spray into both nostrils 2 (two) times daily.   . Blood Pressure Monitoring (BLOOD PRESSURE CUFF) MISC 1 Device by Does not apply route daily. Normal upper arm BP cuff  . BROMSITE 0.075 % SOLN 2 (two) times daily.   Marland Kitchen diltiazem (CARDIZEM CD) 240 MG 24 hr capsule Take 1 capsule (240 mg total) by mouth every morning.  . Garlic 10 MG CAPS Take by mouth. Unsure of dose  . glucose blood test strip ONETOUCH ULTRA BLUE (In Vitro Strip)  1 (one) Strip Strip check blood sugar once daily for 0 days  Quantity: 50;  Refills: 12   Ordered :02-Oct-2013  Kyle Vargas;  Started 03-Jul-2013 Active  . glucose blood test strip Use as  instructed  . losartan (COZAAR) 100 MG tablet Take 1 tablet (100 mg total) by mouth daily.  . metoprolol tartrate (LOPRESSOR) 25 MG tablet Take 1.5 tabs twice daily  . Omega-3 Fatty Acids (OMEGA-3 FISH OIL) 1200 MG CAPS Take 1 capsule (1,200 mg total) by mouth 3 (three) times daily.  . Sodium Chloride-Sodium Bicarb (AYR SALINE NASAL NETI RINSE) 1.57 G PACK Place into the nose as needed.   . hydrochlorothiazide (HYDRODIURIL) 25 MG tablet Take 1 tablet (25 mg total) by mouth daily.   No current facility-administered medications on file prior to visit.    Review of Systems  Constitutional:  Negative for activity change, appetite change, chills, diaphoresis, fatigue and fever.  HENT: Negative for congestion and hearing loss.   Eyes: Negative for visual disturbance.  Respiratory: Negative for apnea, cough, chest tightness, shortness of breath and wheezing.   Cardiovascular: Negative for chest pain, palpitations and leg swelling.  Gastrointestinal: Negative for abdominal pain, anal bleeding, blood in stool, constipation, diarrhea, nausea and vomiting.  Endocrine: Negative for cold intolerance.  Genitourinary: Negative for difficulty urinating, dysuria, frequency and hematuria.  Musculoskeletal: Negative for arthralgias, back pain and neck pain.  Skin: Negative for rash.  Allergic/Immunologic: Negative for environmental allergies.  Neurological: Negative for dizziness, weakness, light-headedness, numbness and headaches.  Hematological: Negative for adenopathy.  Psychiatric/Behavioral: Negative for behavioral problems, dysphoric mood and sleep disturbance. The patient is not nervous/anxious.    Per HPI unless specifically indicated above      Objective:    BP 134/63   Pulse 69   Temp (!) 97.1 F (36.2 C) (Temporal)   Resp 16   Ht 5\' 8"  (1.727 m)   Wt 175 lb 9.6 oz (79.7 kg)   SpO2 98%   BMI 26.70 kg/m   Wt Readings from Last 3 Encounters:  10/21/19 175 lb 9.6 oz (79.7 kg)  07/09/19  178 lb (80.7 kg)  02/19/19 178 lb (80.7 kg)    Physical Exam Vitals and nursing note reviewed.  Constitutional:      General: He is not in acute distress.    Appearance: He is well-developed. He is not diaphoretic.     Comments: Well-appearing, comfortable, cooperative  HENT:     Head: Normocephalic and atraumatic.     Right Ear: Ear canal normal.     Left Ear: Ear canal normal.     Ears:     Comments: Bilateral TMs obscured by dry cerumen L>R Eyes:     General:        Right eye: No discharge.        Left eye: No discharge.     Conjunctiva/sclera: Conjunctivae normal.     Pupils: Pupils are equal, round, and reactive to light.  Neck:     Thyroid: No thyromegaly.     Vascular: No carotid bruit.  Cardiovascular:     Rate and Rhythm: Normal rate and regular rhythm.     Heart sounds: Normal heart sounds. No murmur heard.   Pulmonary:     Effort: Pulmonary effort is normal. No respiratory distress.     Breath sounds: Normal breath sounds. No wheezing or rales.  Abdominal:     General: Bowel sounds are normal. There is no distension.     Palpations: Abdomen is soft. There is no mass.     Tenderness: There is no abdominal tenderness.  Musculoskeletal:        General: No tenderness. Normal range of motion.     Cervical back: Normal range of motion and neck supple.     Comments: Upper / Lower Extremities: - Normal muscle tone, strength bilateral upper extremities 5/5, lower extremities 5/5  Lymphadenopathy:     Cervical: No cervical adenopathy.  Skin:    General: Skin is warm and dry.     Findings: No erythema or rash.  Neurological:     Mental Status: He is alert and oriented to person, place, and time.     Comments: Distal sensation intact to light touch all extremities  Psychiatric:        Behavior: Behavior normal.     Comments: Well groomed, good  eye contact, normal speech and thoughts       Results for orders placed or performed in visit on 10/14/19  Lipid panel    Result Value Ref Range   Cholesterol 148 <200 mg/dL   HDL 38 (L) > OR = 40 mg/dL   Triglycerides 226 (H) <150 mg/dL   LDL Cholesterol (Calc) 79 mg/dL (calc)   Total CHOL/HDL Ratio 3.9 <5.0 (calc)   Non-HDL Cholesterol (Calc) 110 <130 mg/dL (calc)  COMPLETE METABOLIC PANEL WITH GFR  Result Value Ref Range   Glucose, Bld 116 (H) 65 - 99 mg/dL   BUN 16 7 - 25 mg/dL   Creat 1.06 0.70 - 1.18 mg/dL   GFR, Est Non African American 67 > OR = 60 mL/min/1.58m2   GFR, Est African American 78 > OR = 60 mL/min/1.41m2   BUN/Creatinine Ratio NOT APPLICABLE 6 - 22 (calc)   Sodium 142 135 - 146 mmol/L   Potassium 3.9 3.5 - 5.3 mmol/L   Chloride 106 98 - 110 mmol/L   CO2 30 20 - 32 mmol/L   Calcium 9.3 8.6 - 10.3 mg/dL   Total Protein 6.4 6.1 - 8.1 g/dL   Albumin 4.1 3.6 - 5.1 g/dL   Globulin 2.3 1.9 - 3.7 g/dL (calc)   AG Ratio 1.8 1.0 - 2.5 (calc)   Total Bilirubin 0.4 0.2 - 1.2 mg/dL   Alkaline phosphatase (APISO) 86 35 - 144 U/L   AST 22 10 - 35 U/L   ALT 36 9 - 46 U/L  CBC with Differential/Platelet  Result Value Ref Range   WBC 6.6 3.8 - 10.8 Thousand/uL   RBC 4.44 4.20 - 5.80 Million/uL   Hemoglobin 14.4 13.2 - 17.1 g/dL   HCT 43.2 38 - 50 %   MCV 97.3 80.0 - 100.0 fL   MCH 32.4 27.0 - 33.0 pg   MCHC 33.3 32.0 - 36.0 g/dL   RDW 13.9 11.0 - 15.0 %   Platelets 191 140 - 400 Thousand/uL   MPV 9.5 7.5 - 12.5 fL   Neutro Abs 4,244 1,500 - 7,800 cells/uL   Lymphs Abs 1,571 850 - 3,900 cells/uL   Absolute Monocytes 495 200 - 950 cells/uL   Eosinophils Absolute 257 15 - 500 cells/uL   Basophils Absolute 33 0 - 200 cells/uL   Neutrophils Relative % 64.3 %   Total Lymphocyte 23.8 %   Monocytes Relative 7.5 %   Eosinophils Relative 3.9 %   Basophils Relative 0.5 %  Hemoglobin A1c  Result Value Ref Range   Hgb A1c MFr Bld 5.7 (H) <5.7 % of total Hgb   Mean Plasma Glucose 117 (calc)   eAG (mmol/L) 6.5 (calc)      Assessment & Plan:   Problem List Items Addressed This Visit     Pre-diabetes    Stable PreDM A1c improved to 5.7 Concern with HTN, HLD  Plan:  1. Not on any therapy currently - again defer new med today - since improved 2. Encourage improved lifestyle - low carb, low sugar diet, reduce portion size, continue improving regular exercise      Hyperlipidemia    Still chronic elevated TG but now improved Controlled HDL, LDL, not on statin Last lipid 10/2019 Calculated ASCVD 10 yr risk score 28.8%, elevated  Plan: 1. Discussion on ASCVD risk - he understands risks and prefers to avoid statin - CONTINUE Fish Oil Omega 3 1200 TID, and continue other OTC herbal supplement garlique 2. Continue ASA 81mg  for primary  ASCVD risk reduction 3. Encourage improved lifestyle - low carb/cholesterol, reduce portion size, continue improving regular exercise      Essential hypertension    Controlled BP Home readings reviewed No known complications except history of PVCs Followed by Cardiology   Plan:  1. Continue current BP regimen - Losartan 100mg , HCTZ 25mg , Metoprolol 37.5mg  BID (1.5 tabs of 25mg ), Diltiazem 240mg  2. Encourage improved lifestyle - low sodium diet, continue regular exercise 3. Continue monitor BP outside office, bring readings to next visit, if persistently >140/90 or new symptoms notify office sooner      BPH with obstruction/lower urinary tract symptoms    Stable without change in LUTS Not on therapy rx Followed by [facility] urology Kyle Maryan Puls Abstracted most recent PSA - Will follow-up as needed, defer future PSA if he checks at urology       Other Visit Diagnoses    Annual physical exam    -  Primary       Updated Health Maintenance information Reviewed recent lab results with patient Encouraged improvement to lifestyle with diet and exercise - Goal of weight loss   No orders of the defined types were placed in this encounter.    Follow up plan: Return in about 1 year (around 10/20/2020) for Annual  Physical.  Future labs 10/15/20  Nobie Putnam, [facility] 10/21/2019, 8:23 AM

## 2019-10-21 NOTE — Assessment & Plan Note (Signed)
Stable without change in LUTS Not on therapy rx Followed by [facility] urology Dr Maryan Puls Abstracted most recent PSA - Will follow-up as needed, defer future PSA if he checks at urology

## 2019-10-21 NOTE — Assessment & Plan Note (Addendum)
Controlled BP Home readings reviewed No known complications except history of PVCs Followed by Cardiology   Plan:  1. Continue current BP regimen - Losartan '100mg'$ , HCTZ '25mg'$ , Metoprolol 37.'5mg'$  BID (1.5 tabs of '25mg'$ ), Diltiazem '240mg'$  2. Encourage improved lifestyle - low sodium diet, continue regular exercise 3. Continue monitor BP outside office, bring readings to next visit, if persistently >140/90 or new symptoms notify office sooner

## 2019-10-21 NOTE — Assessment & Plan Note (Signed)
Stable PreDM A1c improved to 5.7 Concern with HTN, HLD  Plan:  1. Not on any therapy currently - again defer new med today - since improved 2. Encourage improved lifestyle - low carb, low sugar diet, reduce portion size, continue improving regular exercise

## 2019-10-21 NOTE — Assessment & Plan Note (Signed)
Still chronic elevated TG but now improved Controlled HDL, LDL, not on statin Last lipid 10/2019 Calculated ASCVD 10 yr risk score 28.8%, elevated  Plan: 1. Discussion on ASCVD risk - he understands risks and prefers to avoid statin - CONTINUE Fish Oil Omega 3 1200 TID, and continue other OTC herbal supplement garlique 2. Continue ASA 81mg  for primary ASCVD risk reduction 3. Encourage improved lifestyle - low carb/cholesterol, reduce portion size, continue improving regular exercise

## 2019-10-21 NOTE — Patient Instructions (Addendum)
Thank you for coming to the office today.  Keep up the good work overall.  Keep on Cholesterol supplements. Improved Triglycerides and A1c sugar.  DUE for FASTING BLOOD WORK (no food or drink after midnight before the lab appointment, only water or coffee without cream/sugar on the morning of)  SCHEDULE "Lab Only" visit in the morning at the clinic for lab draw in 1 YEAR  - Make sure Lab Only appointment is at about 1 week before your next appointment, so that results will be available  For Lab Results, once available within 2-3 days of blood draw, you can can log in to MyChart online to view your results and a brief explanation. Also, we can discuss results at next follow-up visit.   Please schedule a Follow-up Appointment to: Return in about 1 year (around 10/20/2020) for Annual Physical.  If you have any other questions or concerns, please feel free to call the office or send a message through [place]. You may also schedule an earlier appointment if necessary.  Additionally, you may be receiving a survey about your experience at our office within a few days to 1 week by e-mail or mail. We value your feedback.  Nobie Putnam, DO [place]

## 2019-12-05 ENCOUNTER — Telehealth: Payer: Self-pay | Admitting: General Practice

## 2019-12-05 ENCOUNTER — Ambulatory Visit (INDEPENDENT_AMBULATORY_CARE_PROVIDER_SITE_OTHER): Payer: Medicare Other | Admitting: General Practice

## 2019-12-05 DIAGNOSIS — I10 Essential (primary) hypertension: Secondary | ICD-10-CM | POA: Diagnosis not present

## 2019-12-05 DIAGNOSIS — E782 Mixed hyperlipidemia: Secondary | ICD-10-CM

## 2019-12-05 DIAGNOSIS — R7303 Prediabetes: Secondary | ICD-10-CM

## 2019-12-05 NOTE — Chronic Care Management (AMB) (Signed)
Chronic Care Management   Follow Up Note   12/05/2019 Name: Kyle Filbert Brien Jr. MRN: [id number] DOB: [birth year]  Referred by: [name], DO Reason for referral : Appointment (RNCM Follow up Appointment) and Chronic Care Management (RNCM Follow up for Chronic Disease Management and Care Coordination Needs)   Kyle Vargas. is a 79 y.o. year old male who is a primary care patient of [name], DO. The CCM team was consulted for assistance with chronic disease management and care coordination needs.    Review of patient status, including review of consultants reports, relevant laboratory and other test results, and collaboration with appropriate care team members and the patient's provider was performed as part of comprehensive patient evaluation and provision of chronic care management services.    SDOH (Social Determinants of Health) assessments performed: Yes See Care Plan activities for detailed interventions related to [facility])     Outpatient Encounter Medications as of 12/05/2019  Medication Sig Note  . aspirin 81 MG tablet Take 81 mg by mouth daily. Takes 5 days a week 12/13/2016: Currently not taking d/t procedure   . azelastine (ASTELIN) 0.1 % nasal spray Place 1 spray into both nostrils 2 (two) times daily.    . Blood Pressure Monitoring (BLOOD PRESSURE CUFF) MISC 1 Device by Does not apply route daily. Normal upper arm BP cuff   . BROMSITE 0.075 % SOLN 2 (two) times daily.    Marland Kitchen diltiazem (CARDIZEM CD) 240 MG 24 hr capsule Take 1 capsule (240 mg total) by mouth every morning.   . Garlic 10 MG CAPS Take by mouth. Unsure of dose   . glucose blood test strip ONETOUCH ULTRA BLUE (In Vitro Strip)  1 (one) Strip Strip check blood sugar once daily for 0 days  Quantity: 50;  Refills: 12   Ordered :02-Oct-2013  [name];  Started 03-Jul-2013 Active 08/09/2014: Received from: [employer]  . glucose blood test strip Use as instructed   .  hydrochlorothiazide (HYDRODIURIL) 25 MG tablet Take 1 tablet (25 mg total) by mouth daily.   Marland Kitchen losartan (COZAAR) 100 MG tablet Take 1 tablet (100 mg total) by mouth daily.   . metoprolol tartrate (LOPRESSOR) 25 MG tablet Take 1.5 tabs twice daily   . Omega-3 Fatty Acids (OMEGA-3 FISH OIL) 1200 MG CAPS Take 1 capsule (1,200 mg total) by mouth 3 (three) times daily. 12/25/2018: 3600mg  daily   . Sodium Chloride-Sodium Bicarb (AYR SALINE NASAL NETI RINSE) 1.57 G PACK Place into the nose as needed.  12/25/2018: As needed    No facility-administered encounter medications on file as of 12/05/2019.     Objective:  BP Readings from Last 3 Encounters:  10/21/19 134/63  07/29/19 130/82  07/09/19 (!) 165/75   Lab Results  Component Value Date   HGBA1C 5.7 (H) 10/14/2019    Goals Addressed              This Visit's Progress   .  RNCM: [name] (pt-stated)        CARE PLAN ENTRY (see longtitudinal plan of care for additional care plan information)  Current Barriers:  . Chronic Disease Management support, education, and care coordination needs related to HTN, HLD, and pre-diabetic  Clinical Goal(s) related to HTN, HLD, and pre-diabetic :  Over the next 120 days, patient will:  . Work with the care management team to address educational, disease management, and care coordination needs  .  Begin or continue self health monitoring activities as directed today Measure and record cbg (blood glucose) 1 times weekly or as needed for change in condition, Measure and record blood pressure 4 times per week, and adhere to a Heart healthy diet . Call provider office for new or worsened signs and symptoms Blood glucose findings outside established parameters, Blood pressure findings outside established parameters, and New or worsened symptom related to HLD and other chronic health conditions . Call care management team with questions or concerns . Verbalize basic understanding  of patient centered plan of care established today  Interventions related to HTN, HLD, and pre-diabetic :  . Evaluation of current treatment plans and patient's adherence to plan as established by provider.  The patient states he feels he manages his health well. Saw urologist this week and also had an eye exam yesterday. A little over a week ago he had basal cell carcinoma and squamous cell carcinoma removed from his neck, leg and back. He said the leg and back was just a scraping and his neck about an inch long and has stitches.  He is keeping salve on the area and watching. Denies any s/s of infection. 12-05-2019: The patient saw the pcp in June and received a good report. The patient states he is doing very well. Denies any new concerns at this time.  . Assessed patient understanding of disease states.  The patient has a good understanding of his disease processes. Is concerned because he is pre-diabetic but he tries to watch his dietary restrictions. The patient denies any concerns at this time.  . Evaluation of blood pressure readings. The patient states systolic his blood pressures are 782-423'N and diastolic 36-14.  His blood sugars are in 110 range. He states he has gained about 5 pounds but still is exercising at least 5 days a week depending on the weather and walking. The patient feels he is doing well managing his health. 12-05-2019: The patient states that he is pleased with his current level of health and so is the MD. The patients hemoglobin A1C was 5.7 on 10-21-2019.  The patient feels good. Is enjoying working in the garden and getting fresh vegetables.  . Assessed patient's education and care coordination needs: Denies any needs at this time. Stays away from salt. Uses a lot of pepper. 12-05-2019: Eating a lot of vegetables and fruits from the garden.  . Provided disease specific education to patient: Will send information on low sodium options by mychart and EMMI system . Collaborated with  appropriate clinical care team members regarding patient needs- reviewed with the patient the CCM team pharmacist and LCSW- denies any needs at this time. . Evaluation of next appointment with pcp. The patients next appointment with pcp is 10-22-2020.  RNCM will follow up with the patient on 03-05-2020. The patient knows to call for changes in conditions or needs before then.   Patient Self Care Activities related to HTN, HLD, and pre-diabetic :  . Patient is unable to independently self-manage chronic health conditions  Please see past updates related to this goal by clicking on the "Past Updates" button in the selected goal          Plan:   Telephone follow up appointment with care management team member scheduled for: 03-05-2020 at 0900   [place], MSN, [place] Mobile: [phone]

## 2019-12-05 NOTE — Patient Instructions (Signed)
Visit Information  Goals Addressed              This Visit's Progress   .  RNCM: [name] (pt-stated)        CARE PLAN ENTRY (see longtitudinal plan of care for additional care plan information)  Current Barriers:  . Chronic Disease Management support, education, and care coordination needs related to HTN, HLD, and pre-diabetic  Clinical Goal(s) related to HTN, HLD, and pre-diabetic :  Over the next 120 days, patient will:  . Work with the care management team to address educational, disease management, and care coordination needs  . Begin or continue self health monitoring activities as directed today Measure and record cbg (blood glucose) 1 times weekly or as needed for change in condition, Measure and record blood pressure 4 times per week, and adhere to a Heart healthy diet . Call provider office for new or worsened signs and symptoms Blood glucose findings outside established parameters, Blood pressure findings outside established parameters, and New or worsened symptom related to HLD and other chronic health conditions . Call care management team with questions or concerns . Verbalize basic understanding of patient centered plan of care established today  Interventions related to HTN, HLD, and pre-diabetic :  . Evaluation of current treatment plans and patient's adherence to plan as established by provider.  The patient states he feels he manages his health well. Saw urologist this week and also had an eye exam yesterday. A little over a week ago he had basal cell carcinoma and squamous cell carcinoma removed from his neck, leg and back. He said the leg and back was just a scraping and his neck about an inch long and has stitches.  He is keeping salve on the area and watching. Denies any s/s of infection. 12-05-2019: The patient saw the pcp in June and received a good report. The patient states he is doing very well. Denies any new concerns at this time.    . Assessed patient understanding of disease states.  The patient has a good understanding of his disease processes. Is concerned because he is pre-diabetic but he tries to watch his dietary restrictions. The patient denies any concerns at this time.  . Evaluation of blood pressure readings. The patient states systolic his blood pressures are 621-308'M and diastolic 57-84.  His blood sugars are in 110 range. He states he has gained about 5 pounds but still is exercising at least 5 days a week depending on the weather and walking. The patient feels he is doing well managing his health. 12-05-2019: The patient states that he is pleased with his current level of health and so is the MD. The patients hemoglobin A1C was 5.7 on 10-21-2019.  The patient feels good. Is enjoying working in the garden and getting fresh vegetables.  . Assessed patient's education and care coordination needs: Denies any needs at this time. Stays away from salt. Uses a lot of pepper. 12-05-2019: Eating a lot of vegetables and fruits from the garden.  . Provided disease specific education to patient: Will send information on low sodium options by mychart and EMMI system . Collaborated with appropriate clinical care team members regarding patient needs- reviewed with the patient the CCM team pharmacist and LCSW- denies any needs at this time. . Evaluation of next appointment with pcp. The patients next appointment with pcp is 10-22-2020.  RNCM will follow up with the patient on 03-05-2020. The patient knows  to call for changes in conditions or needs before then.   Patient Self Care Activities related to HTN, HLD, and pre-diabetic :  . Patient is unable to independently self-manage chronic health conditions  Please see past updates related to this goal by clicking on the "Past Updates" button in the selected goal         Patient verbalizes understanding of instructions provided today.   Telephone follow up appointment with care  management team member scheduled for: 03-05-2020 at 0900  [place], MSN, [place] Mobile: [phone]

## 2020-01-06 ENCOUNTER — Other Ambulatory Visit: Payer: Self-pay | Admitting: Family Medicine

## 2020-01-06 DIAGNOSIS — I10 Essential (primary) hypertension: Secondary | ICD-10-CM

## 2020-01-06 DIAGNOSIS — I493 Ventricular premature depolarization: Secondary | ICD-10-CM

## 2020-01-06 NOTE — Telephone Encounter (Signed)
Requested Prescriptions  Pending Prescriptions Disp Refills   metoprolol tartrate (LOPRESSOR) 25 MG tablet [Pharmacy Med Name: METOPROLOL TARTRATE 25 MG TAB] 270 tablet 0    Sig: Take 1.5 tabs twice daily     Cardiovascular:  Beta Blockers Passed - 01/06/2020  8:34 AM      Passed - Last BP in normal range    BP Readings from Last 1 Encounters:  10/21/19 134/63         Passed - Last Heart Rate in normal range    Pulse Readings from Last 1 Encounters:  10/21/19 69         Passed - Valid encounter within last 6 months    Recent Outpatient Visits          2 months ago Annual physical exam   [place], DO   6 months ago Rectal bleeding   [place], Devonne Doughty, DO   1 year ago Annual physical exam   [place], DO   1 year ago Mixed hyperlipidemia   [place], DO   1 year ago Acute non-recurrent maxillary sinusitis   [place], DO      Future Appointments            In 1 month [place] An[place], Mertie Clause, MD [facility], [place]   In 9 months [place], Devonne Doughty, [facility], [facility]

## 2020-02-08 ENCOUNTER — Other Ambulatory Visit: Payer: Self-pay | Admitting: Family Medicine

## 2020-02-08 DIAGNOSIS — I10 Essential (primary) hypertension: Secondary | ICD-10-CM

## 2020-02-08 NOTE — Telephone Encounter (Signed)
Requested Prescriptions  Pending Prescriptions Disp Refills  . losartan (COZAAR) 100 MG tablet [Pharmacy Med Name: LOSARTAN POTASSIUM 100 MG TAB] 90 tablet 0    Sig: Take 1 tablet (100 mg total) by mouth daily.     Cardiovascular:  Angiotensin Receptor Blockers Passed - 02/08/2020  9:35 AM      Passed - Cr in normal range and within 180 days    Creat  Date Value Ref Range Status  10/14/2019 1.06 0.70 - 1.18 mg/dL Final    Comment:    For patients >28 years of age, the reference limit for Creatinine is approximately 13% higher for people identified as African-American. .          Passed - K in normal range and within 180 days    Potassium  Date Value Ref Range Status  10/14/2019 3.9 3.5 - 5.3 mmol/L Final  06/02/2012 3.9 3.5 - 5.1 mmol/L Final         Passed - Patient is not pregnant      Passed - Last BP in normal range    BP Readings from Last 1 Encounters:  10/21/19 134/63         Passed - Valid encounter within last 6 months    Recent Outpatient Visits          3 months ago Annual physical exam   [place], DO   7 months ago Rectal bleeding   [place], Devonne Doughty, DO   1 year ago Annual physical exam   [place], DO   1 year ago Mixed hyperlipidemia   [place], DO   2 years ago Acute non-recurrent maxillary sinusitis   [facility] [name], DO      Future Appointments            In 1 week Fletcher Anon, Mertie Clause, MD [facility], [place]   In 8 months [place], Devonne Doughty, [facility], [facility]

## 2020-02-10 ENCOUNTER — Ambulatory Visit: Payer: Medicare Other | Attending: Internal Medicine

## 2020-02-10 DIAGNOSIS — Z23 Encounter for immunization: Secondary | ICD-10-CM

## 2020-02-10 NOTE — Progress Notes (Signed)
   YTMMI-19 Vaccination Clinic  Name:  Kyle Vargas.    MRN: [id number] DOB: 27-Oct-1940  02/10/2020  Kyle Vargas was observed post Covid-19 immunization for 15 minutes without incident. He was provided with Vaccine Information Sheet and instruction to access the V-Safe system.   Kyle Vargas was instructed to call 911 with any severe reactions post vaccine: Marland Kitchen Difficulty breathing  . Swelling of face and throat  . A fast heartbeat  . A bad rash all over body  . Dizziness and weakness

## 2020-02-13 DIAGNOSIS — L570 Actinic keratosis: Secondary | ICD-10-CM | POA: Diagnosis not present

## 2020-02-13 DIAGNOSIS — D485 Neoplasm of uncertain behavior of skin: Secondary | ICD-10-CM | POA: Diagnosis not present

## 2020-02-13 DIAGNOSIS — D0461 Carcinoma in situ of skin of right upper limb, including shoulder: Secondary | ICD-10-CM | POA: Diagnosis not present

## 2020-02-13 DIAGNOSIS — Z7189 Other specified counseling: Secondary | ICD-10-CM | POA: Diagnosis not present

## 2020-02-13 DIAGNOSIS — Z85828 Personal history of other malignant neoplasm of skin: Secondary | ICD-10-CM | POA: Diagnosis not present

## 2020-02-20 ENCOUNTER — Encounter: Payer: Self-pay | Admitting: Cardiovascular Disease

## 2020-02-20 ENCOUNTER — Other Ambulatory Visit: Payer: Self-pay

## 2020-02-20 ENCOUNTER — Ambulatory Visit: Payer: Medicare Other | Admitting: Cardiovascular Disease

## 2020-02-20 VITALS — BP 122/60 | HR 63 | Ht 68.0 in | Wt 178.1 lb

## 2020-02-20 DIAGNOSIS — I493 Ventricular premature depolarization: Secondary | ICD-10-CM | POA: Diagnosis not present

## 2020-02-20 DIAGNOSIS — I10 Essential (primary) hypertension: Secondary | ICD-10-CM | POA: Diagnosis not present

## 2020-02-20 MED ORDER — METOPROLOL TARTRATE 25 MG PO TABS: ORAL_TABLET | ORAL | 0 refills | Status: DC

## 2020-02-20 NOTE — Progress Notes (Signed)
Cardiology Office Note   Date:  02/20/2020   ID:  Kyle Vargas., DOB January 27, 1941, MRN [id number]  PCP:  [name], DO  Cardiologist:   Kyle Sacramento, MD   Chief Complaint  Patient presents with  . other    12 month follow up. Meds reviewed by the pt. verbally. "doing well."       History of Present Illness: Kyle Vargas. is a 79 y.o. male who presents for a followup visit regarding palpitations and PVCs. The patient has prolonged history of palpitations thought to be due to PVCs. Negative stress test in the past.  Echocardiogram in 02/14 showed normal LV systolic function without significant valvular abnormalities. There was mild left ventricular hypertrophy with mild diastolic dysfunction. Holter monitor showed excessive monomorphic PVCs of RVOT origin. He has been treated with diltiazem and Metoprolol with excellent control of symptoms. He continues to be very active and enjoys babysitting his grandkids.  He has known history of refractory hypertension.  Renal artery duplex in 2019 showed no evidence of renal artery stenosis.   He has been doing well with no recent chest pain or shortness of breath.  He reports minimal palpitations.  He reports some fatigue with metoprolol.    Past Medical History:  Diagnosis Date  . Allergic rhinitis, cause unspecified   . Diarrhea   . Dysrhythmia   . Esophageal reflux   . H/O diverticulitis of colon   . History of kidney stones   . PVC's (premature ventricular contractions)    Right ventricular outflow tract    Past Surgical History:  Procedure Laterality Date  . BASAL CELL CARCINOMA EXCISION    . CATARACT EXTRACTION, BILATERAL  2020  . COLONOSCOPY    . COLONOSCOPY WITH PROPOFOL N/A 02/08/2017   Procedure: COLONOSCOPY WITH PROPOFOL;  Surgeon: Manya Silvas, MD;  [facility]COPY;  Service: Endoscopy;  Laterality: N/A;  . REPAIR IMPERFORATE ANUS / ANORECTOPLASTY    . TRANSURETHRAL RESECTION  OF PROSTATE       Current Outpatient Medications  Medication Sig Dispense Refill  . aspirin 81 MG tablet Take 81 mg by mouth daily. Takes 5 days a week    . azelastine (ASTELIN) 0.1 % nasal spray Place 1 spray into both nostrils 2 (two) times daily.     . Blood Pressure Monitoring (BLOOD PRESSURE CUFF) MISC 1 Device by Does not apply route daily. Normal upper arm BP cuff 1 each 0  . BROMSITE 0.075 % SOLN 2 (two) times daily.     Marland Kitchen diltiazem (CARDIZEM CD) 240 MG 24 hr capsule Take 1 capsule (240 mg total) by mouth every morning. 90 capsule 3  . Garlic 10 MG CAPS Take by mouth. Unsure of dose    . glucose blood test strip ONETOUCH ULTRA BLUE (In Vitro Strip)  1 (one) Strip Strip check blood sugar once daily for 0 days  Quantity: 50;  Refills: 12   Ordered :02-Oct-2013  [name];  Started 03-Jul-2013 Active    . glucose blood test strip Use as instructed 100 each 12  . hydrochlorothiazide (HYDRODIURIL) 25 MG tablet Take 1 tablet (25 mg total) by mouth daily. 90 tablet 3  . losartan (COZAAR) 100 MG tablet Take 1 tablet (100 mg total) by mouth daily. 90 tablet 0  . metoprolol tartrate (LOPRESSOR) 25 MG tablet Take 1.5 tabs twice daily 270 tablet 0  . Omega-3 Fatty Acids (OMEGA-3 FISH OIL) 1200 MG CAPS Take 1 capsule (  1,200 mg total) by mouth 3 (three) times daily. 90 capsule 0  . Sodium Chloride-Sodium Bicarb (AYR SALINE NASAL NETI RINSE) 1.57 G PACK Place into the nose as needed.      No current facility-administered medications for this visit.    Allergies:   Dust mite extract and Other    Social History:  The patient  reports that he has never smoked. He has never used smokeless tobacco. He reports current alcohol use. He reports that he does not use drugs.   Family History:  The patient's family history includes Hypertension in his father.    ROS:  Please see the history of present illness.   Otherwise, review of systems are positive for none.   All other systems are reviewed  and negative.    PHYSICAL EXAM: VS:  BP 122/60 (BP Location: Left Arm, Patient Position: Sitting, Cuff Size: Normal)   Pulse 63   Ht 5\' 8"  (1.727 m)   Wt 178 lb 2 oz (80.8 kg)   SpO2 98%   BMI 27.08 kg/m  , BMI Body mass index is 27.08 kg/m. GEN: Well nourished, well developed, in no acute distress  HEENT: normal  Neck: no JVD, carotid bruits, or masses Cardiac: RRR; no murmurs, rubs, or gallops,no edema  Respiratory:  clear to auscultation bilaterally, normal work of breathing GI: soft, nontender, nondistended, + BS MS: no deformity or atrophy  Skin: warm and dry, no rash Neuro:  Strength and sensation are intact Psych: euthymic mood, full affect   EKG:  EKG is ordered today. The ekg ordered today demonstrates normal sinus rhythm with right bundle branch block.   Recent Labs: 10/14/2019: ALT 36; BUN 16; Creat 1.06; Hemoglobin 14.4; Platelets 191; Potassium 3.9; Sodium 142    Lipid Panel    Component Value Date/Time   CHOL 148 10/14/2019 0807   TRIG 226 (H) 10/14/2019 0807   HDL 38 (L) 10/14/2019 0807   CHOLHDL 3.9 10/14/2019 0807   VLDL 45 (H) 07/06/2016 0001   LDLCALC 79 10/14/2019 0807      Wt Readings from Last 3 Encounters:  02/20/20 178 lb 2 oz (80.8 kg)  10/21/19 175 lb 9.6 oz (79.7 kg)  07/09/19 178 lb (80.7 kg)       No flowsheet data found.    ASSESSMENT AND PLAN:  1.  Symptomatic PVCs: Well controlled on current dose of diltiazem and metoprolol with no side effects. Normal ejection fraction.  Some fatigue with metoprolol and I elected to decrease the dose to 25 mg twice daily.  2. Essential hypertension: He has refractory hypertension with no evidence of secondary hypertension.  His blood pressure is reasonably controlled on current medications.   Disposition:   FU with me in 1 year  Signed,  Kyle Sacramento, MD  02/20/2020 3:03 PM    [facility]

## 2020-02-20 NOTE — Patient Instructions (Signed)
Medication Instructions:  - Your physician has recommended you make the following change in your medication:   1) DECREASE metoprolol tartrate to 25 mg- take 1 tablet by mouth twice daily   *If you need a refill on your cardiac medications before your next appointment, please call your pharmacy*   Lab Work: - none ordered  If you have labs (blood work) drawn today and your tests are completely normal, you will receive your results only by: Marland Kitchen MyChart Message (if you have MyChart) OR . A paper copy in the mail If you have any lab test that is abnormal or we need to change your treatment, we will call you to review the results.   Testing/Procedures: - none ordered   Follow-Up: At [facility], you and your health needs are our priority.  As part of our continuing mission to provide you with exceptional heart care, we have created designated Provider Care Teams.  These Care Teams include your primary Cardiologist (physician) and Advanced Practice Providers (APPs -  Physician Assistants and Nurse Practitioners) who all work together to provide you with the care you need, when you need it.  We recommend signing up for the patient portal called "MyChart".  Sign up information is provided on this After Visit Summary.  MyChart is used to connect with patients for Virtual Visits (Telemedicine).  Patients are able to view lab/test results, encounter notes, upcoming appointments, etc.  Non-urgent messages can be sent to your provider as well.   To learn more about what you can do with MyChart, go to [email or web address].    Your next appointment:   1 year(s)  The format for your next appointment:   In Person  Provider:   You may see Kathlyn Sacramento, MD or one of the following Advanced Practice Providers on your designated Care Team:    Murray Hodgkins, NP  Christell Faith, PA-C  Marrianne Mood, PA-C  Cadence Kathlen Mody, [place]    Other Instructions

## 2020-02-22 ENCOUNTER — Other Ambulatory Visit: Payer: Self-pay | Admitting: Cardiovascular Disease

## 2020-03-04 ENCOUNTER — Other Ambulatory Visit: Payer: Self-pay | Admitting: Cardiovascular Disease

## 2020-03-05 ENCOUNTER — Telehealth: Payer: Self-pay

## 2020-03-05 ENCOUNTER — Telehealth: Payer: Self-pay | Admitting: General Practice

## 2020-03-05 NOTE — Telephone Encounter (Signed)
  Chronic Care Management   Outreach Note  03/05/2020 Name: Kyle Vargas [employer]. MRN: [id number] DOB: October 07, 1940  Referred by: [name], DO Reason for referral : Chronic Care Management (RNCM follow up call for Chronic Disease Management and Care Coordination Needs)   An unsuccessful telephone outreach was attempted today. The patient was referred to the case management team for assistance with care management and care coordination.   Follow Up Plan: A HIPAA compliant phone message was left for the patient providing contact information and requesting a return call.   [name], MSN, [place] Mobile: [phone]

## 2020-03-12 DIAGNOSIS — H43812 Vitreous degeneration, left eye: Secondary | ICD-10-CM | POA: Diagnosis not present

## 2020-03-12 DIAGNOSIS — H26493 Other secondary cataract, bilateral: Secondary | ICD-10-CM | POA: Diagnosis not present

## 2020-03-24 DIAGNOSIS — L821 Other seborrheic keratosis: Secondary | ICD-10-CM | POA: Diagnosis not present

## 2020-03-24 DIAGNOSIS — L570 Actinic keratosis: Secondary | ICD-10-CM | POA: Diagnosis not present

## 2020-03-24 DIAGNOSIS — L578 Other skin changes due to chronic exposure to nonionizing radiation: Secondary | ICD-10-CM | POA: Diagnosis not present

## 2020-03-24 DIAGNOSIS — L814 Other melanin hyperpigmentation: Secondary | ICD-10-CM | POA: Diagnosis not present

## 2020-03-24 DIAGNOSIS — D0461 Carcinoma in situ of skin of right upper limb, including shoulder: Secondary | ICD-10-CM | POA: Diagnosis not present

## 2020-03-24 NOTE — Telephone Encounter (Signed)
Pt has been r/s  

## 2020-04-27 ENCOUNTER — Other Ambulatory Visit: Payer: Self-pay | Admitting: Family Medicine

## 2020-04-27 NOTE — Telephone Encounter (Signed)
Requested medication (s) are due for refill today: no  Requested medication (s) are on the active medication list: yes  Last refill:  02/10/20  Future visit scheduled: yes in 6 months  Notes to clinic:  Med was prescribed last fill by cardiologist- forwarding to office to review   Requested Prescriptions  Pending Prescriptions Disp Refills   metoprolol tartrate (LOPRESSOR) 25 MG tablet [Pharmacy Med Name: METOPROLOL TARTRATE 25 MG TAB] 270 tablet 0    Sig: Take 1.5 tabs twice daily      Cardiovascular:  Beta Blockers Failed - 04/27/2020  9:19 AM      Failed - Valid encounter within last 6 months    Recent Outpatient Visits           6 months ago Annual physical exam   [place], DO   9 months ago Rectal bleeding   [place], Devonne Doughty, DO   1 year ago Annual physical exam   [facility] [name], DO   2 years ago Mixed hyperlipidemia   [place], Devonne Doughty, DO   2 years ago Acute non-recurrent maxillary sinusitis   [place], DO       Future Appointments             In 5 months [place], Devonne Doughty, [facility], [place] BP in normal range    BP Readings from Last 1 Encounters:  02/20/20 122/60          Passed - Last Heart Rate in normal range    Pulse Readings from Last 1 Encounters:  02/20/20 63

## 2020-05-07 ENCOUNTER — Ambulatory Visit: Payer: Self-pay | Admitting: General Practice

## 2020-05-07 ENCOUNTER — Telehealth: Payer: Medicare Other | Admitting: General Practice

## 2020-05-07 DIAGNOSIS — I10 Essential (primary) hypertension: Secondary | ICD-10-CM

## 2020-05-07 DIAGNOSIS — E782 Mixed hyperlipidemia: Secondary | ICD-10-CM

## 2020-05-07 NOTE — Patient Instructions (Signed)
Visit Information  Patient Care Plan: RNCM: Management of Hypertension and HLD (Adult)    Problem Identified: RNCM: Hypertension and HLD (Hypertension)   Priority: Medium    Goal: RNCM: Management of HTN and HLD   Priority: Medium  Note:   Objective:  . Last practice recorded BP readings:  BP Readings from Last 3 Encounters:  02/20/20 122/60  10/21/19 134/63  07/29/19 130/82 .   Marland Kitchen Most recent eGFR/CrCl: No results found for: EGFR  No components found for: CRCL Current Barriers:  Marland Kitchen Knowledge Deficits related to basic understanding of hypertension  and HLD pathophysiology and self care management . Unable to independently manage HTN and HLD Case Manager Clinical Goal(s):  Marland Kitchen Over the next 120 days, patient will verbalize understanding of plan for hypertension management . Over the next 120 days, patient will attend all scheduled medical appointments: 10-22-2020 with the pcp . Over the next 120 days, patient will demonstrate improved adherence to prescribed treatment plan for hypertension as evidenced by taking all medications as prescribed, monitoring and recording blood pressure as directed, adhering to low sodium/DASH diet . Over the next 120 days, patient will demonstrate improved health management independence as evidenced by checking blood pressure as directed and notifying PCP if SBP>160 or DBP > 90, taking all medications as prescribe, and adhering to a low sodium diet as discussed. Interventions:  . Evaluation of current treatment plan related to hypertension and HLD self management and patient's adherence to plan as established by provider. . Provided education to patient re: stroke prevention, s/s of heart attack and stroke, DASH diet, complications of uncontrolled blood pressure . Reviewed medications with patient and discussed importance of compliance.  The patient states he is doing well with managing his medications.  . Discussed plans with patient for ongoing care management  follow up and provided patient with direct contact information for care management team . Advised patient, providing education and rationale, to monitor blood pressure daily and record, calling PCP for findings outside established parameters.  . Reviewed scheduled/upcoming provider appointments including: 10-22-2020 . - blood pressure trends reviewed . - depression screen reviewed . - home or ambulatory blood pressure monitoring encouraged Patient Goals/Self-Care Activities . Over the next 120 days, patient will:  - Calls provider office for new concerns, questions, or BP outside discussed parameters Checks BP and records as discussed Follows a low sodium diet/DASH diet Follow Up Plan: Telephone follow up appointment with care management team member scheduled for:  07-20-2020 at 1 pm   Task: RNCM: Identify and Monitor Blood Pressure Elevation   Note:   Care Management Activities:    - blood pressure trends reviewed - depression screen reviewed - home or ambulatory blood pressure monitoring encouraged         The patient verbalized understanding of instructions, educational materials, and care plan provided today and declined offer to receive copy of patient instructions, educational materials, and care plan.   Telephone follow up appointment with care management team member scheduled for: 07-20-2020 at 1 pm  [name], MSN, [place] Mobile: [phone]

## 2020-05-07 NOTE — Chronic Care Management (AMB) (Signed)
Chronic Care Management   Follow Up Note   05/07/2020 Name: Kyle Mccauley Karaffa Jr. MRN: [id number] DOB: [birth year]  Referred by: [name], DO Reason for referral : Chronic Care Management (RNCM Follow up for Chronic Disease Management and Care Coordination Needs)   Kyle Vargas Kyle Vargas. is a 79 y.o. year old male who is a primary care patient of [name], DO. The CCM team was consulted for assistance with chronic disease management and care coordination needs.    Review of patient status, including review of consultants reports, relevant laboratory and other test results, and collaboration with appropriate care team members and the patient's provider was performed as part of comprehensive patient evaluation and provision of chronic care management services.    SDOH (Social Determinants of Health) assessments performed: Yes See Care Plan activities for detailed interventions related to [facility])     Outpatient Encounter Medications as of 05/07/2020  Medication Sig Note  . aspirin 81 MG tablet Take 81 mg by mouth daily. Takes 5 days a week   . azelastine (ASTELIN) 0.1 % nasal spray Place 1 spray into both nostrils 2 (two) times daily.    . Blood Pressure Monitoring (BLOOD PRESSURE CUFF) MISC 1 Device by Does not apply route daily. Normal upper arm BP cuff   . BROMSITE 0.075 % SOLN 2 (two) times daily.    Marland Kitchen diltiazem (CARDIZEM CD) 240 MG 24 hr capsule Take 1 capsule (240 mg total) by mouth every morning.   . Garlic 10 MG CAPS Take by mouth. Unsure of dose   . glucose blood test strip ONETOUCH ULTRA BLUE (In Vitro Strip)  1 (one) Strip Strip check blood sugar once daily for 0 days  Quantity: 50;  Refills: 12   Ordered :02-Oct-2013  [name];  Started 03-Jul-2013 Active 08/09/2014: Received from: [employer]  . glucose blood test strip Use as instructed   . hydrochlorothiazide (HYDRODIURIL) 25 MG tablet Take 1 tablet (25 mg total) by mouth daily.    Marland Kitchen losartan (COZAAR) 100 MG tablet Take 1 tablet (100 mg total) by mouth daily.   . metoprolol tartrate (LOPRESSOR) 25 MG tablet Take 1.5 tabs twice daily   . Omega-3 Fatty Acids (OMEGA-3 FISH OIL) 1200 MG CAPS Take 1 capsule (1,200 mg total) by mouth 3 (three) times daily. 12/25/2018: 3610m daily   . Sodium Chloride-Sodium Bicarb (AYR SALINE NASAL NETI RINSE) 1.57 G PACK Place into the nose as needed.  12/25/2018: As needed    No facility-administered encounter medications on file as of 05/07/2020.     Objective:   Goals Addressed              This Visit's Progress   .  COMPLETED: RNCM: [name] (pt-stated)        CARE PLAN ENTRY (see longtitudinal plan of care for additional care plan information)  Current Barriers: Closing this goal and opening a new one in the ELS system . Chronic Disease Management support, education, and care coordination needs related to HTN, HLD, and pre-diabetic  Clinical Goal(s) related to HTN, HLD, and pre-diabetic :  Over the next 120 days, patient will:  . Work with the care management team to address educational, disease management, and care coordination needs  . Begin or continue self health monitoring activities as directed today Measure and record cbg (blood glucose) 1 times weekly or as needed for change in condition, Measure and record blood pressure  4 times per week, and adhere to a Heart healthy diet . Call provider office for new or worsened signs and symptoms Blood glucose findings outside established parameters, Blood pressure findings outside established parameters, and New or worsened symptom related to HLD and other chronic health conditions . Call care management team with questions or concerns . Verbalize basic understanding of patient centered plan of care established today  Interventions related to HTN, HLD, and pre-diabetic :  . Evaluation of current treatment plans and patient's adherence to plan as  established by provider.  The patient states he feels he manages his health well. Saw urologist this week and also had an eye exam yesterday. A little over a week ago he had basal cell carcinoma and squamous cell carcinoma removed from his neck, leg and back. He said the leg and back was just a scraping and his neck about an inch long and has stitches.  He is keeping salve on the area and watching. Denies any s/s of infection. 12-05-2019: The patient saw the pcp in June and received a good report. The patient states he is doing very well. Denies any new concerns at this time.  . Assessed patient understanding of disease states.  The patient has a good understanding of his disease processes. Is concerned because he is pre-diabetic but he tries to watch his dietary restrictions. The patient denies any concerns at this time.  . Evaluation of blood pressure readings. The patient states systolic his blood pressures are 638-937'D and diastolic 42-87.  His blood sugars are in 110 range. He states he has gained about 5 pounds but still is exercising at least 5 days a week depending on the weather and walking. The patient feels he is doing well managing his health. 12-05-2019: The patient states that he is pleased with his current level of health and so is the MD. The patients hemoglobin A1C was 5.7 on 10-21-2019.  The patient feels good. Is enjoying working in the garden and getting fresh vegetables.  . Assessed patient's education and care coordination needs: Denies any needs at this time. Stays away from salt. Uses a lot of pepper. 12-05-2019: Eating a lot of vegetables and fruits from the garden.  . Provided disease specific education to patient: Will send information on low sodium options by mychart and EMMI system . Collaborated with appropriate clinical care team members regarding patient needs- reviewed with the patient the CCM team pharmacist and LCSW- denies any needs at this time. . Evaluation of next  appointment with pcp. The patients next appointment with pcp is 10-22-2020.  RNCM will follow up with the patient on 03-05-2020. The patient knows to call for changes in conditions or needs before then.   Patient Self Care Activities related to HTN, HLD, and pre-diabetic :  . Patient is unable to independently self-manage chronic health conditions  Please see past updates related to this goal by clicking on the "Past Updates" button in the selected goal         Patient Care Plan: RNCM: Management of Hypertension and HLD (Adult)    Problem Identified: RNCM: Hypertension and HLD (Hypertension)   Priority: Medium    Goal: RNCM: Management of HTN and HLD   Priority: Medium  Note:   Objective:  . Last practice recorded BP readings:  BP Readings from Last 3 Encounters:  02/20/20 122/60  10/21/19 134/63  07/29/19 130/82 .   Marland Kitchen Most recent eGFR/CrCl: No results found for: EGFR  No components found  for: CRCL Current Barriers:  Marland Kitchen Knowledge Deficits related to basic understanding of hypertension  and HLD pathophysiology and self care management . Unable to independently manage HTN and HLD Case Manager Clinical Goal(s):  Marland Kitchen Over the next 120 days, patient will verbalize understanding of plan for hypertension management . Over the next 120 days, patient will attend all scheduled medical appointments: 10-22-2020 with the pcp . Over the next 120 days, patient will demonstrate improved adherence to prescribed treatment plan for hypertension as evidenced by taking all medications as prescribed, monitoring and recording blood pressure as directed, adhering to low sodium/DASH diet . Over the next 120 days, patient will demonstrate improved health management independence as evidenced by checking blood pressure as directed and notifying PCP if SBP>160 or DBP > 90, taking all medications as prescribe, and adhering to a low sodium diet as discussed. Interventions:  . Evaluation of current treatment plan  related to hypertension and HLD self management and patient's adherence to plan as established by provider. . Provided education to patient re: stroke prevention, s/s of heart attack and stroke, DASH diet, complications of uncontrolled blood pressure . Reviewed medications with patient and discussed importance of compliance.  The patient states he is doing well with managing his medications.  . Discussed plans with patient for ongoing care management follow up and provided patient with direct contact information for care management team . Advised patient, providing education and rationale, to monitor blood pressure daily and record, calling PCP for findings outside established parameters.  . Reviewed scheduled/upcoming provider appointments including: 10-22-2020 . - blood pressure trends reviewed . - depression screen reviewed . - home or ambulatory blood pressure monitoring encouraged Patient Goals/Self-Care Activities . Over the next 120 days, patient will:  - Calls provider office for new concerns, questions, or BP outside discussed parameters Checks BP and records as discussed Follows a low sodium diet/DASH diet Follow Up Plan: Telephone follow up appointment with care management team member scheduled for:  07-20-2020 at 1 pm   Task: RNCM: Identify and Monitor Blood Pressure Elevation   Note:   Care Management Activities:    - blood pressure trends reviewed - depression screen reviewed - home or ambulatory blood pressure monitoring encouraged         Plan:   Telephone follow up appointment with care management team member scheduled for: 07-20-2020 at 1 pm   [place], MSN, [place] Mobile: [phone]

## 2020-05-08 ENCOUNTER — Other Ambulatory Visit: Payer: Self-pay | Admitting: Family Medicine

## 2020-05-08 DIAGNOSIS — I10 Essential (primary) hypertension: Secondary | ICD-10-CM

## 2020-05-10 NOTE — Telephone Encounter (Signed)
Requested Prescriptions  Pending Prescriptions Disp Refills  . losartan (COZAAR) 100 MG tablet [Pharmacy Med Name: LOSARTAN POTASSIUM 100 MG TAB] 90 tablet 0    Sig: Take 1 tablet (100 mg total) by mouth daily.     Cardiovascular:  Angiotensin Receptor Blockers Failed - 05/08/2020  8:50 AM      Failed - Cr in normal range and within 180 days    Creat  Date Value Ref Range Status  10/14/2019 1.06 0.70 - 1.18 mg/dL Final    Comment:    For patients >40 years of age, the reference limit for Creatinine is approximately 13% higher for people identified as African-American. .          Failed - K in normal range and within 180 days    Potassium  Date Value Ref Range Status  10/14/2019 3.9 3.5 - 5.3 mmol/L Final  06/02/2012 3.9 3.5 - 5.1 mmol/L Final         Failed - Valid encounter within last 6 months    Recent Outpatient Visits          6 months ago Annual physical exam   [facility] Smitty Cords, DO   10 months ago Rectal bleeding   [facility] Althea Charon, Netta Neat, DO   1 year ago Annual physical exam   [facility] Smitty Cords, DO   2 years ago Mixed hyperlipidemia   [facility], Netta Neat, DO   2 years ago Acute non-recurrent maxillary sinusitis   [facility] Althea Charon, Netta Neat, DO      Future Appointments            In 5 months Althea Charon, Netta Neat, DO [facility]           Passed - Patient is not pregnant      Passed - Last BP in normal range    BP Readings from Last 1 Encounters:  02/20/20 122/60

## 2020-05-26 ENCOUNTER — Telehealth: Payer: Self-pay | Admitting: General Practice

## 2020-05-26 ENCOUNTER — Telehealth: Payer: Self-pay | Admitting: Family Medicine

## 2020-05-26 NOTE — Telephone Encounter (Signed)
This telephone encounter is in error. Under wrong practice. Please disregard.  [name], MSN, [facility] [place] Mobile: [phone]

## 2020-05-26 NOTE — Telephone Encounter (Signed)
Error

## 2020-05-26 NOTE — Telephone Encounter (Signed)
Patient agreed to a phone/virtual visit tomorrow at 1:20.

## 2020-05-26 NOTE — Telephone Encounter (Cosign Needed)
  Chronic Care Management   Note  05/26/2020 Name: Kyle Vargas [employer]. MRN: [id number] DOB: [birth year]   The patient called and left a VM asking for a call back due to having sinus drainage and needing something "called in" from the pcp. Secure message sent to the pcp regarding the patients request. Will follow up with the provider on recommendations.   [name], MSN, [place] Mobile: [phone]

## 2020-05-26 NOTE — Telephone Encounter (Signed)
Received a message from [place] Nurse Case Management from this patient who called her to and left a voice mail that states he has a sinus infection or sinus issue and wanted to know if we could call something in.  Please call patient back and offer next available Virtual Visit to discuss his symptoms and treat him accordingly. Let him know, due to very high volume of calls for this reason, we can't phone in treatment without having at least a virtual visit first  Thank you  Nobie Putnam, [facility] 05/26/2020, 3:56 PM

## 2020-05-27 ENCOUNTER — Encounter: Payer: Self-pay | Admitting: Family Medicine

## 2020-05-27 ENCOUNTER — Telehealth (INDEPENDENT_AMBULATORY_CARE_PROVIDER_SITE_OTHER): Payer: Medicare HMO | Admitting: Family Medicine

## 2020-05-27 ENCOUNTER — Other Ambulatory Visit: Payer: Self-pay

## 2020-05-27 VITALS — BP 138/64 | HR 69 | Temp 97.1°F | Ht 68.0 in | Wt 178.0 lb

## 2020-05-27 DIAGNOSIS — T485X5A Adverse effect of other anti-common-cold drugs, initial encounter: Secondary | ICD-10-CM

## 2020-05-27 DIAGNOSIS — J0100 Acute maxillary sinusitis, unspecified: Secondary | ICD-10-CM | POA: Diagnosis not present

## 2020-05-27 DIAGNOSIS — J310 Chronic rhinitis: Secondary | ICD-10-CM

## 2020-05-27 MED ORDER — AMOXICILLIN-POT CLAVULANATE 875-125 MG PO TABS: 1.0000 | ORAL_TABLET | Freq: Two times a day (BID) | ORAL | 0 refills | Status: DC

## 2020-05-27 MED ORDER — IPRATROPIUM BROMIDE 0.06 % NA SOLN: 2.0000 | Freq: Four times a day (QID) | NASAL | 0 refills | Status: DC

## 2020-05-27 NOTE — Patient Instructions (Addendum)
1 Start Augmentin 875-125mg  PO BID x 10 days 2. STOP Afrin, taper down on this gradually to avoid rebound. 3. Start Atrovent nasal spray decongestant 2 sprays in each nostril up to 4 times daily for 7 days 4. OTC supportive therapy Coricidin, nasal sprays  Please schedule a Follow-up Appointment to: Return if symptoms worsen or fail to improve.  If you have any other questions or concerns, please feel free to call the office or send a message through [place]. You may also schedule an earlier appointment if necessary.  Additionally, you may be receiving a survey about your experience at our office within a few days to 1 week by e-mail or mail. We value your feedback.  Nobie Putnam, DO [place]

## 2020-05-27 NOTE — Progress Notes (Signed)
Virtual Visit via Telephone The purpose of this virtual visit is to provide medical care while limiting exposure to the novel coronavirus (COVID19) for both patient and office staff.  Consent was obtained for phone visit:  Yes.   Answered questions that patient had about telehealth interaction:  Yes.   I discussed the limitations, risks, security and privacy concerns of performing an evaluation and management service by telephone. I also discussed with the patient that there may be a patient responsible charge related to this service. The patient expressed understanding and agreed to proceed.  Patient Location: Home Provider Location: [facility] (Office)  Participants in virtual visit: - Patient: Kyle Vargas" G. [place] [name] - CMA: Orinda Kenner, CMA - Provider: Dr Parks Ranger  ---------------------------------------------------------------------- Chief Complaint  Patient presents with  . Sinusitis    S: Reviewed CMA documentation. I have called patient and gathered additional HPI as follows:  Sinusitis Reports that symptoms started onset in November 2021, has had episodic flares, then would improve, similar to past sinusitis episodes, he has seasonally, now recent worsening now 2 weeks with worse sinus pressure congestion. - Tried OTC Afrin regularly multiple times a day. Coricidin HBP. He feels he needs to use Afrin every 6 hours for it to work and allow him to breathe. He is worried this is contributing - Home rapid COVID test 05/24/20 Negative.   Denies any known or suspected exposure to person with or possibly with COVID19.  Admits sinus pain and pressure Denies any fevers, chills, sweats, body ache, cough, shortness of breath, abdominal pain, diarrhea  Past Medical History:  Diagnosis Date  . Allergic rhinitis, cause unspecified   . Diarrhea   . Dysrhythmia   . Esophageal reflux   . H/O diverticulitis of colon   . History of kidney stones   . PVC's  (premature ventricular contractions)    Right ventricular outflow tract   Social History   Tobacco Use  . Smoking status: Never Smoker  . Smokeless tobacco: Never Used  Vaping Use  . Vaping Use: Never used  Substance Use Topics  . Alcohol use: Yes    Comment: occasionally  2 glasses of wine a month   . Drug use: No    Current Outpatient Medications:  .  amoxicillin-clavulanate (AUGMENTIN) 875-125 MG tablet, Take 1 tablet by mouth 2 (two) times daily., Disp: 20 tablet, Rfl: 0 .  aspirin 81 MG tablet, Take 81 mg by mouth daily. Takes 5 days a week, Disp: , Rfl:  .  azelastine (ASTELIN) 0.1 % nasal spray, Place 1 spray into both nostrils 2 (two) times daily. , Disp: , Rfl:  .  Blood Pressure Monitoring (BLOOD PRESSURE CUFF) MISC, 1 Device by Does not apply route daily. Normal upper arm BP cuff, Disp: 1 each, Rfl: 0 .  BROMSITE 0.075 % SOLN, 2 (two) times daily. , Disp: , Rfl:  .  diltiazem (CARDIZEM CD) 240 MG 24 hr capsule, Take 1 capsule (240 mg total) by mouth every morning., Disp: 90 capsule, Rfl: 2 .  Garlic 10 MG CAPS, Take by mouth. Unsure of dose, Disp: , Rfl:  .  glucose blood test strip, ONETOUCH ULTRA BLUE (In Vitro Strip)  1 (one) Strip Strip check blood sugar once daily for 0 days  Quantity: 50;  Refills: 12   Ordered :02-Oct-2013  [name];  Started 03-Jul-2013 Active, Disp: , Rfl:  .  glucose blood test strip, Use as instructed, Disp: 100 each, Rfl: 12 .  ipratropium (ATROVENT) 0.06 % nasal spray, Place 2 sprays into both nostrils 4 (four) times daily. For up to 5-7 days then stop., Disp: 15 mL, Rfl: 0 .  losartan (COZAAR) 100 MG tablet, Take 1 tablet (100 mg total) by mouth daily., Disp: 90 tablet, Rfl: 0 .  metoprolol tartrate (LOPRESSOR) 25 MG tablet, Take 1.5 tabs twice daily, Disp: 270 tablet, Rfl: 0 .  Omega-3 Fatty Acids (OMEGA-3 FISH OIL) 1200 MG CAPS, Take 1 capsule (1,200 mg total) by mouth 3 (three) times daily., Disp: 90 capsule, Rfl: 0 .  Sodium  Chloride-Sodium Bicarb 1.57 g PACK, Place into the nose as needed. , Disp: , Rfl:  .  hydrochlorothiazide (HYDRODIURIL) 25 MG tablet, Take 1 tablet (25 mg total) by mouth daily., Disp: 90 tablet, Rfl: 2  Depression screen [facility] 2/9 10/21/2019 08/01/2019 07/09/2019  Decreased Interest 0 0 0  Down, Depressed, Hopeless 0 0 0  PHQ - 2 Score 0 0 0  Altered sleeping - - -  Tired, decreased energy - - -  Change in appetite - - -  Feeling bad or failure about yourself  - - -  Trouble concentrating - - -  Suicidal thoughts - - -  PHQ-9 Score - - -  Difficult doing work/chores - - -    No flowsheet data found.  -------------------------------------------------------------------------- O: No physical exam performed due to remote telephone encounter.  Lab results reviewed.  No results found for this or any previous visit (from the past 2160 hour(s)).  -------------------------------------------------------------------------- A&P:  Problem List Items Addressed This Visit   None   Visit Diagnoses    Acute non-recurrent maxillary sinusitis    -  Primary   Relevant Medications   ipratropium (ATROVENT) 0.06 % nasal spray   amoxicillin-clavulanate (AUGMENTIN) 875-125 MG tablet   Rhinitis medicamentosa         Consistent with acute on chronic maxillary sinusitis, likely initially viral URI vs allergic rhinitis component with worsening concern for bacterial infection now with extended duration >2+ months now 2nd sickening.  Also high risk for rhinitis medicamentosa with rebound sinusitis following prolonged Afrin usage OTC.  Fully vaccinated against COVID including Booster completed. Last rapid home covid test negative 05/24/20  Plan: 1 Start Augmentin 875-125mg  PO BID x 10 days 2. STOP Afrin, taper down on this gradually to avoid rebound. 3. Start Atrovent nasal spray decongestant 2 sprays in each nostril up to 4 times daily for 7 days 4. OTC supportive therapy Coricidin, nasal  sprays  Return criteria reviewed   Meds ordered this encounter  Medications  . ipratropium (ATROVENT) 0.06 % nasal spray    Sig: Place 2 sprays into both nostrils 4 (four) times daily. For up to 5-7 days then stop.    Dispense:  15 mL    Refill:  0  . amoxicillin-clavulanate (AUGMENTIN) 875-125 MG tablet    Sig: Take 1 tablet by mouth 2 (two) times daily.    Dispense:  20 tablet    Refill:  0    Follow-up: - Return as needed  Patient verbalizes understanding with the above medical recommendations including the limitation of remote medical advice.  Specific follow-up and call-back criteria were given for patient to follow-up or seek medical care more urgently if needed.   - Time spent in direct consultation with patient on phone: 8 minutes   Nobie Putnam, [facility] 05/27/2020, 1:20 PM

## 2020-06-29 ENCOUNTER — Telehealth: Payer: Self-pay | Admitting: Cardiovascular Disease

## 2020-06-29 MED ORDER — METOPROLOL TARTRATE 25 MG PO TABS: 37.5000 mg | ORAL_TABLET | Freq: Two times a day (BID) | ORAL | 0 refills | Status: DC

## 2020-06-29 NOTE — Telephone Encounter (Signed)
Okay that is fine. Thanks  

## 2020-06-29 NOTE — Telephone Encounter (Signed)
Noted. Rx for Metoprolol updated to reflect 37.5 mg bid.

## 2020-06-29 NOTE — Telephone Encounter (Signed)
Pt c/o medication issue:  1. Name of Medication: metoprolol   2. How are you currently taking this medication (dosage and times per day)? Recent change   3. Are you having a reaction (difficulty breathing--STAT)? No   4. What is your medication issue? Recent change in dose decreased at last visit patient started having skipping so he is now back on original dose of 37.5 mg po BID  Patient calling to let us know he went back to original dose and is feeling better

## 2020-06-29 NOTE — Telephone Encounter (Signed)
Patients 02/20/20 appt with Dr. Fletcher Anon:  Per Dr. Tyrell Antonio documentation:  Symptomatic PVCs: Well controlled on current dose of diltiazem and metoprolol with no side effects. Normal ejection fraction.  Some fatigue with metoprolol and I elected to decrease the dose to 25 mg twice daily.  Patient has resumed Metoprolol 37.5 mg bid.  Update FYI fwd to Dr. Fletcher Anon

## 2020-07-20 ENCOUNTER — Telehealth: Payer: Medicare HMO | Admitting: General Practice

## 2020-07-20 ENCOUNTER — Ambulatory Visit (INDEPENDENT_AMBULATORY_CARE_PROVIDER_SITE_OTHER): Payer: Medicare HMO | Admitting: General Practice

## 2020-07-20 DIAGNOSIS — I10 Essential (primary) hypertension: Secondary | ICD-10-CM | POA: Diagnosis not present

## 2020-07-20 DIAGNOSIS — E782 Mixed hyperlipidemia: Secondary | ICD-10-CM

## 2020-07-20 NOTE — Chronic Care Management (AMB) (Signed)
Chronic Care Management   CCM RN Visit Note  07/20/2020 Name: Kyle Vargas [employer]. MRN: [id number] DOB: [birth year]  Subjective: Kyle Vargas Kyle Vargas. is a 80 y.o. year old male who is a primary care patient of [name], DO. The care management team was consulted for assistance with disease management and care coordination needs.    Engaged with patient by telephone for follow up visit in response to provider referral for case management and/or care coordination services.   Consent to Services:  The patient was given information about Chronic Care Management services, agreed to services, and gave verbal consent prior to initiation of services.  Please see initial visit note for detailed documentation.   Patient agreed to services and verbal consent obtained.   Assessment: Review of patient past medical history, allergies, medications, health status, including review of consultants reports, laboratory and other test data, was performed as part of comprehensive evaluation and provision of chronic care management services.   SDOH (Social Determinants of Health) assessments and interventions performed:    CCM Care Plan  Allergies  Allergen Reactions   Dust Mite Extract     eye itching   Other Other (See Comments)    Dogs and cats, itching eyes, sneezing     Outpatient Encounter Medications as of 07/20/2020  Medication Sig Note   amoxicillin-clavulanate (AUGMENTIN) 875-125 MG tablet Take 1 tablet by mouth 2 (two) times daily.    aspirin 81 MG tablet Take 81 mg by mouth daily. Takes 5 days a week    azelastine (ASTELIN) 0.1 % nasal spray Place 1 spray into both nostrils 2 (two) times daily.     Blood Pressure Monitoring (BLOOD PRESSURE CUFF) MISC 1 Device by Does not apply route daily. Normal upper arm BP cuff    BROMSITE 0.075 % SOLN 2 (two) times daily.     diltiazem (CARDIZEM CD) 240 MG 24 hr capsule Take 1 capsule (240 mg total) by mouth every morning.    Garlic  10 MG CAPS Take by mouth. Unsure of dose    glucose blood test strip ONETOUCH ULTRA BLUE (In Vitro Strip)  1 (one) Strip Strip check blood sugar once daily for 0 days  Quantity: 50;  Refills: 12   Ordered :02-Oct-2013  [name];  Started 03-Jul-2013 Active 08/09/2014: Received from: [place]   glucose blood test strip Use as instructed    hydrochlorothiazide (HYDRODIURIL) 25 MG tablet Take 1 tablet (25 mg total) by mouth daily.    ipratropium (ATROVENT) 0.06 % nasal spray Place 2 sprays into both nostrils 4 (four) times daily. For up to 5-7 days then stop.    losartan (COZAAR) 100 MG tablet Take 1 tablet (100 mg total) by mouth daily.    metoprolol tartrate (LOPRESSOR) 25 MG tablet Take 1.5 tablets (37.5 mg total) by mouth 2 (two) times daily.    Omega-3 Fatty Acids (OMEGA-3 FISH OIL) 1200 MG CAPS Take 1 capsule (1,200 mg total) by mouth 3 (three) times daily. 12/25/2018: 3616m daily    Sodium Chloride-Sodium Bicarb 1.57 g PACK Place into the nose as needed.  12/25/2018: As needed    No facility-administered encounter medications on file as of 07/20/2020.    Patient Active Problem List   Diagnosis Date Noted   Hyperlipidemia 02/15/2017   BPH with obstruction/lower urinary tract symptoms 11/07/2016   History of nonmelanoma skin cancer 11/07/2016   Pre-diabetes 10/26/2015   PVC's (premature ventricular contractions)    Essential hypertension  Conditions to be addressed/monitored:HTN and HLD  Care Plan : RNCM: Management of Hypertension  Updates made by Kyle Vargas since 07/20/2020 12:00 AM    Problem: RNCM: Hypertension   Priority: Medium    Long-Range Goal: RNCM: Management of HTN   Priority: Medium  Note:   Objective:   Last practice recorded BP readings:   BP Readings from Last 3 Encounters:   05/27/20  138/64   02/20/20  122/60   10/21/19  134/63      Most recent eGFR/CrCl: No results found for: EGFR  No components  found for: CRCL Current Barriers:   Knowledge Deficits related to basic understanding of hypertension  pathophysiology and self care management  Unable to independently manage HTN  Case Manager Clinical Goal(s):   Over the next 120 days, patient will verbalize understanding of plan for hypertension management  Over the next 120 days, patient will attend all scheduled medical appointments: 10-22-2020 with the pcp  Over the next 120 days, patient will demonstrate improved adherence to prescribed treatment plan for hypertension as evidenced by taking all medications as prescribed, monitoring and recording blood pressure as directed, adhering to low sodium/DASH diet  Over the next 120 days, patient will demonstrate improved health management independence as evidenced by checking blood pressure as directed and notifying PCP if SBP>160 or DBP > 90, taking all medications as prescribe, and adhering to a low sodium diet as discussed. Interventions:   Evaluation of current treatment plan related to hypertension self management and patient's adherence to plan as established by provider. 07-20-2020: The patient is doing well states that after seeing pcp on 05-27-2020 by video visit his wife tested positive for COVID and then he tested positive for COVID. He finished the course of antibiotics and also other recommendations and said sx/sx lasted about 5 days. He denies any complications at this time. Will continue to monitor.   Provided education to patient re: stroke prevention, s/s of heart attack and stroke, DASH diet, complications of uncontrolled blood pressure  Reviewed medications with patient and discussed importance of compliance.  The patient states he is doing well with managing his medications. 07-20-2020: The patient is eating well and denies any new concerns with his dietary intake.   Discussed plans with patient for ongoing care management follow up and provided patient with direct contact  information for care management team  Advised patient, providing education and rationale, to monitor blood pressure daily and record, calling PCP for findings outside established parameters. 07-20-2020: The patient continue to take his blood pressure at home. States last week he had an episode of it being low at 118/61. States he noticed a little light headedness and dizziness but it resolved.  Review of orthostatic hypotension.   Reviewed scheduled/upcoming provider appointments including: 10-22-2020  - blood pressure trends reviewed  - depression screen reviewed  - home or ambulatory blood pressure monitoring encouraged Patient Goals/Self-Care Activities  Over the next 120 days, patient will:  - Calls provider office for new concerns, questions, or BP outside discussed parameters Checks BP and records as discussed Follows a low sodium diet/DASH diet Follow Up Plan: Telephone follow up appointment with care management team member scheduled for:  09-28-2020 at 1:45 pm   Care Plan : RNCM: HLD Management  Updates made by Kyle Vargas since 07/20/2020 12:00 AM    Problem: RNCM: HLD Management   Priority: Medium    Long-Range Goal: RNCM: HLD Management   Priority: Medium  Note:  Current Barriers:   Poorly controlled hyperlipidemia, complicated by HTN and recent COVID infection   Current antihyperlipidemic regimen: Omega 3 1200 mg   Most recent lipid panel:     Component Value Date/Time   CHOL 148 10/14/2019 0807   TRIG 226 (H) 10/14/2019 0807   HDL 38 (L) 10/14/2019 0807   CHOLHDL 3.9 10/14/2019 0807   VLDL 45 (H) 07/06/2016 0001   LDLCALC 79 10/14/2019 0807     ASCVD risk enhancing conditions: age >62, pre-DM, HTN  Lacks social connections  Does not contact provider office for questions/concerns RN Care Manager Clinical Goal(s):   patient will work with Consulting civil engineer, providers, and care team towards execution of optimized self-health management plan  patient will  verbalize understanding of plan for effective management of HLD   patient will work with [facility] and pcp  to address needs related to effective management of HLD   patient will attend all scheduled medical appointments: 10-22-2020 at 1 pm Interventions:  Collaboration with [name], DO regarding development and update of comprehensive plan of care as evidenced by provider attestation and co-signature  Inter-disciplinary care team collaboration (see longitudinal plan of care)  Medication review performed; medication list updated in electronic medical record.   Inter-disciplinary care team collaboration (see longitudinal plan of care)  Referred to pharmacy team for assistance with HLD medication management  Evaluation of current treatment plan related to HLD and patient's adherence to plan as established by provider.  Advised patient to call the office for concerns or questions   Provided education to patient re: heart healthy diet, taking medications as directed, and working with the CCM team to manage health and well being   Reviewed medications with patient and discussed compliance   Reviewed scheduled/upcoming provider appointments including: [phone] pm   Discussed plans with patient for ongoing care management follow up and provided patient with direct contact information for care management team Patient Goals/Self-Care Activities: - call for medicine refill 2 or 3 days before it runs out - call if I am sick and can't take my medicine - keep a list of all the medicines I take; vitamins and herbals too - learn to read medicine labels - use a pillbox to sort medicine - use an alarm clock or phone to remind me to take my medicine - change to whole grain breads, cereal, pasta - drink 6 to 8 glasses of water each day - eat 3 to 5 servings of fruits and vegetables each day - eat 5 or 6 small meals each day - eat fish at least once per week - fill half the plate  with nonstarchy vegetables - limit fast food meals to no more than 1 per week - manage portion size - prepare main meal at home 3 to 5 days each week - read food labels for fat, fiber, carbohydrates and portion size - reduce red meat to 2 to 3 times a week - set a realistic goal - be open to making changes - I can manage, know and watch for signs of a heart attack - if I have chest pain, call for help - learn about small changes that will make a big difference - learn my personal risk factors - barriers to meeting goals identified - change-talk evoked - choices provided - collaboration with team encouraged - decision-making supported - difficulty of making life-long changes acknowledged - health risks reviewed - problem-solving facilitated - questions answered - readiness for change evaluated - reassurance  provided - self-reflection promoted - self-reliance encouraged  Follow Up Plan: Telephone follow up appointment with care management team member scheduled for:09-28-2020 at  1:45 pm     Task: RNCM: HLD Management   Note:   Care Management Activities:    - barriers to meeting goals identified - change-talk evoked - choices provided - collaboration with team encouraged - decision-making supported - difficulty of making life-long changes acknowledged - health risks reviewed - problem-solving facilitated - questions answered - readiness for change evaluated - reassurance provided - self-reflection promoted - self-reliance encouraged         Plan:Telephone follow up appointment with care management team member scheduled for:  09-28-2020 at 1:45 pm  [name], MSN, [place] Mobile: [phone]

## 2020-07-20 NOTE — Patient Instructions (Signed)
Visit Information  PATIENT GOALS: Patient Care Plan: RNCM: Management of Hypertension    Problem Identified: RNCM: Hypertension   Priority: Medium    Long-Range Goal: RNCM: Management of HTN   Priority: Medium  Note:   Objective:  . Last practice recorded BP readings:  . BP Readings from Last 3 Encounters: .  05/27/20 . 138/64 .  02/20/20 . 122/60 .  10/21/19 . 134/63 .    Marland Kitchen Most recent eGFR/CrCl: No results found for: EGFR  No components found for: CRCL Current Barriers:  Marland Kitchen Knowledge Deficits related to basic understanding of hypertension  pathophysiology and self care management . Unable to independently manage HTN  Case Manager Clinical Goal(s):  Marland Kitchen Over the next 120 days, patient will verbalize understanding of plan for hypertension management . Over the next 120 days, patient will attend all scheduled medical appointments: 10-22-2020 with the pcp . Over the next 120 days, patient will demonstrate improved adherence to prescribed treatment plan for hypertension as evidenced by taking all medications as prescribed, monitoring and recording blood pressure as directed, adhering to low sodium/DASH diet . Over the next 120 days, patient will demonstrate improved health management independence as evidenced by checking blood pressure as directed and notifying PCP if SBP>160 or DBP > 90, taking all medications as prescribe, and adhering to a low sodium diet as discussed. Interventions:  . Evaluation of current treatment plan related to hypertension self management and patient's adherence to plan as established by provider. 07-20-2020: The patient is doing well states that after seeing pcp on 05-27-2020 by video visit his wife tested positive for COVID and then he tested positive for COVID. He finished the course of antibiotics and also other recommendations and said sx/sx lasted about 5 days. He denies any complications at this time. Will continue to monitor.  . Provided education to patient re:  stroke prevention, s/s of heart attack and stroke, DASH diet, complications of uncontrolled blood pressure . Reviewed medications with patient and discussed importance of compliance.  The patient states he is doing well with managing his medications. 07-20-2020: The patient is eating well and denies any new concerns with his dietary intake.  Marland Kitchen Discussed plans with patient for ongoing care management follow up and provided patient with direct contact information for care management team . Advised patient, providing education and rationale, to monitor blood pressure daily and record, calling PCP for findings outside established parameters. 07-20-2020: The patient continue to take his blood pressure at home. States last week he had an episode of it being low at 118/61. States he noticed a little light headedness and dizziness but it resolved.  Review of orthostatic hypotension.  . Reviewed scheduled/upcoming provider appointments including: 10-22-2020 . - blood pressure trends reviewed . - depression screen reviewed . - home or ambulatory blood pressure monitoring encouraged Patient Goals/Self-Care Activities . Over the next 120 days, patient will:  - Calls provider office for new concerns, questions, or BP outside discussed parameters Checks BP and records as discussed Follows a low sodium diet/DASH diet Follow Up Plan: Telephone follow up appointment with care management team member scheduled for:  09-28-2020 at 1:45 pm   Task: RNCM: Identify and Monitor Blood Pressure Elevation   Note:   Care Management Activities:    - blood pressure trends reviewed - depression screen reviewed - home or ambulatory blood pressure monitoring encouraged       Patient Care Plan: RNCM: HLD Management    Problem Identified: RNCM: HLD Management  Priority: Medium    Long-Range Goal: RNCM: HLD Management   Priority: Medium  Note:   Current Barriers:  . Poorly controlled hyperlipidemia, complicated by HTN and  recent COVID infection  . Current antihyperlipidemic regimen: Omega 3 1200 mg  . Most recent lipid panel:     Component Value Date/Time   CHOL 148 10/14/2019 0807   TRIG 226 (H) 10/14/2019 0807   HDL 38 (L) 10/14/2019 0807   CHOLHDL 3.9 10/14/2019 0807   VLDL 45 (H) 07/06/2016 0001   LDLCALC 79 10/14/2019 0807 .   Marland Kitchen ASCVD risk enhancing conditions: age >4, pre-DM, HTN . Lacks social connections . Does not contact provider office for questions/concerns RN Care Manager Clinical Goal(s):  . patient will work with Consulting civil engineer, providers, and care team towards execution of optimized self-health management plan . patient will verbalize understanding of plan for effective management of HLD  . patient will work with [facility] and pcp  to address needs related to effective management of HLD  . patient will attend all scheduled medical appointments: 10-22-2020 at 1 pm Interventions: . Collaboration with [name], DO regarding development and update of comprehensive plan of care as evidenced by provider attestation and co-signature . Inter-disciplinary care team collaboration (see longitudinal plan of care) . Medication review performed; medication list updated in electronic medical record.  Bertram Savin care team collaboration (see longitudinal plan of care) . Referred to pharmacy team for assistance with HLD medication management . Evaluation of current treatment plan related to HLD and patient's adherence to plan as established by provider. . Advised patient to call the office for concerns or questions  . Provided education to patient re: heart healthy diet, taking medications as directed, and working with the CCM team to manage health and well being  . Reviewed medications with patient and discussed compliance  . Reviewed scheduled/upcoming provider appointments including: [phone] pm  . Discussed plans with patient for ongoing care management follow up and provided  patient with direct contact information for care management team Patient Goals/Self-Care Activities: - call for medicine refill 2 or 3 days before it runs out - call if I am sick and can't take my medicine - keep a list of all the medicines I take; vitamins and herbals too - learn to read medicine labels - use a pillbox to sort medicine - use an alarm clock or phone to remind me to take my medicine - change to whole grain breads, cereal, pasta - drink 6 to 8 glasses of water each day - eat 3 to 5 servings of fruits and vegetables each day - eat 5 or 6 small meals each day - eat fish at least once per week - fill half the plate with nonstarchy vegetables - limit fast food meals to no more than 1 per week - manage portion size - prepare main meal at home 3 to 5 days each week - read food labels for fat, fiber, carbohydrates and portion size - reduce red meat to 2 to 3 times a week - set a realistic goal - be open to making changes - I can manage, know and watch for signs of a heart attack - if I have chest pain, call for help - learn about small changes that will make a big difference - learn my personal risk factors - barriers to meeting goals identified - change-talk evoked - choices provided - collaboration with team encouraged - decision-making supported - difficulty of making life-long changes  acknowledged - health risks reviewed - problem-solving facilitated - questions answered - readiness for change evaluated - reassurance provided - self-reflection promoted - self-reliance encouraged  Follow Up Plan: Telephone follow up appointment with care management team member scheduled for:09-28-2020 at  1:45 pm     Task: RNCM: HLD Management   Note:   Care Management Activities:    - barriers to meeting goals identified - change-talk evoked - choices provided - collaboration with team encouraged - decision-making supported - difficulty of making life-long changes  acknowledged - health risks reviewed - problem-solving facilitated - questions answered - readiness for change evaluated - reassurance provided - self-reflection promoted - self-reliance encouraged         Patient verbalizes understanding of instructions provided today and agrees to view in [place].   Telephone follow up appointment with care management team member scheduled for: 09-28-2020 at 1:45 pm  [name], MSN, [place] Mobile: [phone]

## 2020-07-27 DIAGNOSIS — N401 Enlarged prostate with lower urinary tract symptoms: Secondary | ICD-10-CM | POA: Diagnosis not present

## 2020-07-27 DIAGNOSIS — Z125 Encounter for screening for malignant neoplasm of prostate: Secondary | ICD-10-CM | POA: Diagnosis not present

## 2020-07-27 DIAGNOSIS — M5489 Other dorsalgia: Secondary | ICD-10-CM | POA: Diagnosis not present

## 2020-08-04 ENCOUNTER — Ambulatory Visit (INDEPENDENT_AMBULATORY_CARE_PROVIDER_SITE_OTHER): Payer: Medicare HMO

## 2020-08-04 VITALS — Ht 68.0 in | Wt 180.0 lb

## 2020-08-04 DIAGNOSIS — Z0000 Encounter for general adult medical examination without abnormal findings: Secondary | ICD-10-CM

## 2020-08-04 NOTE — Progress Notes (Signed)
I connected with Kyle Vargas today by telephone and verified that I am speaking with the correct person using two identifiers. Location patient: home Location provider: work Persons participating in the virtual visit: [employer] LPN.   I discussed the limitations, risks, security and privacy concerns of performing an evaluation and management service by telephone and the availability of in person appointments. I also discussed with the patient that there may be a patient responsible charge related to this service. The patient expressed understanding and verbally consented to this telephonic visit.    Interactive audio and video telecommunications were attempted between this provider and patient, however failed, due to patient having technical difficulties OR patient did not have access to video capability.  We continued and completed visit with audio only.     Vital signs may be patient reported or missing.  Subjective:   Kyle Tutterow. is a 80 y.o. male who presents for Medicare Annual/Subsequent preventive examination.  Review of Systems     Cardiac Risk Factors include: advanced age (>35men, >75 women);hypertension;male gender     Objective:    Today's Vitals   08/04/20 1056  Weight: 180 lb (81.6 kg)  Height: 5\' 8"  (1.727 m)   Body mass index is 27.37 kg/m.  Advanced Directives 08/04/2020 08/01/2019 12/25/2018 12/19/2017 02/08/2017 12/13/2016  Does Patient Have a Medical Advance Directive? Yes Yes Yes Yes Yes Yes  Type of Paramedic of [place];Living will Living will Living will;Healthcare Power of [place];Living will [place];Living will [place];Living will  Does patient want to make changes to medical advance directive? - Yes (MAU/Ambulatory/Procedural Areas - Information given) - - - -  Copy of [place] in Chart? Yes - validated most recent copy  scanned in chart (See row information) - - No - copy requested No - copy requested No - copy requested    Current Medications (verified) Outpatient Encounter Medications as of 08/04/2020  Medication Sig  . aspirin 81 MG tablet Take 81 mg by mouth daily. Takes 5 days a week  . azelastine (ASTELIN) 0.1 % nasal spray Place 1 spray into both nostrils 2 (two) times daily.   . Blood Pressure Monitoring (BLOOD PRESSURE CUFF) MISC 1 Device by Does not apply route daily. Normal upper arm BP cuff  . diltiazem (CARDIZEM CD) 240 MG 24 hr capsule Take 1 capsule (240 mg total) by mouth every morning.  . Garlic 10 MG CAPS Take by mouth. Unsure of dose  . glucose blood test strip ONETOUCH ULTRA BLUE (In Vitro Strip)  1 (one) Strip Strip check blood sugar once daily for 0 days  Quantity: 50;  Refills: 12   Ordered :02-Oct-2013  [name];  Started 03-Jul-2013 Active  . glucose blood test strip Use as instructed  . losartan (COZAAR) 100 MG tablet Take 1 tablet (100 mg total) by mouth daily.  . metoprolol tartrate (LOPRESSOR) 25 MG tablet Take 1.5 tablets (37.5 mg total) by mouth 2 (two) times daily.  . Omega-3 Fatty Acids (OMEGA-3 FISH OIL) 1200 MG CAPS Take 1 capsule (1,200 mg total) by mouth 3 (three) times daily.  . Sodium Chloride-Sodium Bicarb 1.57 g PACK Place into the nose as needed.   . tamsulosin (FLOMAX) 0.4 MG CAPS capsule Take 0.4 mg by mouth daily.  Marland Kitchen amoxicillin-clavulanate (AUGMENTIN) 875-125 MG tablet Take 1 tablet by mouth 2 (two) times daily. (Patient not taking: Reported on 08/04/2020)  .  BROMSITE 0.075 % SOLN 2 (two) times daily.  (Patient not taking: Reported on 08/04/2020)  . hydrochlorothiazide (HYDRODIURIL) 25 MG tablet Take 1 tablet (25 mg total) by mouth daily.  Marland Kitchen ipratropium (ATROVENT) 0.06 % nasal spray Place 2 sprays into both nostrils 4 (four) times daily. For up to 5-7 days then stop.   No facility-administered encounter medications on file as of 08/04/2020.    Allergies  (verified) Dust mite extract and Other   History: Past Medical History:  Diagnosis Date  . Allergic rhinitis, cause unspecified   . Diarrhea   . Dysrhythmia   . Esophageal reflux   . H/O diverticulitis of colon   . History of kidney stones   . PVC's (premature ventricular contractions)    Right ventricular outflow tract   Past Surgical History:  Procedure Laterality Date  . BASAL CELL CARCINOMA EXCISION    . CATARACT EXTRACTION, BILATERAL  2020  . COLONOSCOPY    . COLONOSCOPY WITH PROPOFOL N/A 02/08/2017   Procedure: COLONOSCOPY WITH PROPOFOL;  Surgeon: Manya Silvas, MD;  Location: [facility] ENDOSCOPY;  Service: Endoscopy;  Laterality: N/A;  . REPAIR IMPERFORATE ANUS / ANORECTOPLASTY    . TRANSURETHRAL RESECTION OF PROSTATE     Family History  Problem Relation Age of Onset  . Hypertension Father    Social History   Socioeconomic History  . Marital status: Married    Spouse name: Not on file  . Number of children: Not on file  . Years of education: Not on file  . Highest education level: Some college, no degree  Occupational History  . Occupation: retired  Tobacco Use  . Smoking status: Never Smoker  . Smokeless tobacco: Never Used  Vaping Use  . Vaping Use: Never used  Substance and Sexual Activity  . Alcohol use: Yes    Comment: occasionally  2 glasses of wine a month   . Drug use: No  . Sexual activity: Not on file  Other Topics Concern  . Not on file  Social History Narrative  . Not on file   Social Determinants of Health   Financial Resource Strain: Low Risk   . Difficulty of Paying Living Expenses: Not hard at all  Food Insecurity: No Food Insecurity  . Worried About Charity fundraiser in the Last Year: Never true  . Ran Out of Food in the Last Year: Never true  Transportation Needs: No Transportation Needs  . Lack of Transportation (Medical): No  . Lack of Transportation (Non-Medical): No  Physical Activity: Sufficiently Active  . Days of  Exercise per Week: 5 days  . Minutes of Exercise per Session: 30 min  Stress: No Stress Concern Present  . Feeling of Stress : Not at all  Social Connections: Not on file    Tobacco Counseling Counseling given: Not Answered   Clinical Intake:  Pre-visit preparation completed: Yes  Pain : No/denies pain     Nutritional Status: BMI 25 -29 Overweight Nutritional Risks: None Diabetes: No  How often do you need to have someone help you when you read instructions, pamphlets, or other written materials from your doctor or pharmacy?: 1 - Never What is the last grade level you completed in school?: some college  Diabetic? no  Interpreter Needed?: No  Information entered by :: NAllen LPN   Activities of Daily Living In your present state of health, do you have any difficulty performing the following activities: 08/04/2020  Hearing? N  Vision? Y  Comment has a  floater  Difficulty concentrating or making decisions? N  Walking or climbing stairs? N  Dressing or bathing? N  Doing errands, shopping? N  Preparing Food and eating ? N  Using the Toilet? N  In the past six months, have you accidently leaked urine? N  Do you have problems with loss of bowel control? N  Managing your Medications? N  Managing your Finances? N  Housekeeping or managing your Housekeeping? N  Some recent data might be hidden    Patient Care Team: [name], DO as PCP - General (Family Medicine) Jannet Mantis, MD (Dermatology) Royston Cowper, MD as Consulting Physician (Urology) Wellington Hampshire, MD as Consulting Physician (Cardiology) Anell Barr, OD (Optometry) Vanita Ingles, RN as Case Manager (General Practice)  Indicate any recent Medical Services you may have received from other than [facility] providers in the past year (date may be approximate).     Assessment:   This is a routine wellness examination for Kyle Vargas.  Hearing/Vision screen  Hearing Screening    125Hz  250Hz  500Hz  1000Hz  2000Hz  3000Hz  4000Hz  6000Hz  8000Hz   Right ear:           Left ear:           Vision Screening Comments: Regular eye exams, Dr. Ellin Mayhew  Dietary issues and exercise activities discussed: Current Exercise Habits: Home exercise routine, Type of exercise: walking, Time (Minutes): 30, Frequency (Times/Week): 5, Weekly Exercise (Minutes/Week): 150  Goals    . Exercise 3x per week (30 min per time)     Patient wants to continue walking daily to stay healthy. He currently walks 30 mins five days a week.    . Increase water intake     Recommend drinking at least 6-8 glasses of water a day     . Patient Stated     08/04/2020, stay active      Depression Screen PHQ 2/9 Scores 08/04/2020 10/21/2019 08/01/2019 07/09/2019 12/25/2018 02/21/2018 01/30/2018  PHQ - 2 Score 0 0 0 0 0 0 0  PHQ- 9 Score - - - - - - -    Fall Risk Fall Risk  08/04/2020 10/21/2019 07/09/2019 12/25/2018 02/21/2018  Falls in the past year? 0 0 0 0 No  Number falls in past yr: - 0 0 - -  Injury with Fall? - 0 0 - -  Risk for fall due to : Medication side effect - - - -  Follow up Falls evaluation completed;Education provided;Falls prevention discussed Falls evaluation completed Falls evaluation completed - -    FALL RISK PREVENTION PERTAINING TO THE HOME:  Any stairs in or around the home? Yes  If so, are there any without handrails? No  Home free of loose throw rugs in walkways, pet beds, electrical cords, etc? Yes  Adequate lighting in your home to reduce risk of falls? Yes   ASSISTIVE DEVICES UTILIZED TO PREVENT FALLS:  Life alert? No  Use of a cane, walker or w/c? No  Grab bars in the bathroom? No  Shower chair or bench in shower? Yes  Elevated toilet seat or a handicapped toilet? No   TIMED UP AND GO:  Was the test performed? No .      Cognitive Function: MMSE - Mini Mental State Exam 01/23/2015  Orientation to time 5  Orientation to Place 5  Registration 3  Attention/ Calculation 5   Recall 2  Language- name 2 objects 2  Language- repeat 1  Language- follow 3 step command  3  Language- read & follow direction 1  Write a sentence 1  Copy design 1  Total score 29     6CIT Screen 08/04/2020 12/25/2018 12/19/2017 12/13/2016  What Year? 0 points 0 points 0 points 0 points  What month? 0 points 0 points 0 points 0 points  What time? 0 points 0 points 0 points 0 points  Count back from 20 0 points 0 points 0 points 0 points  Months in reverse 0 points 0 points 0 points 0 points  Repeat phrase 0 points 0 points 0 points 0 points  Total Score 0 0 0 0    Immunizations Immunization History  Administered Date(s) Administered  . Influenza, High Dose Seasonal PF 02/11/2014, 01/23/2015, 02/04/2016, 02/15/2017  . Influenza,inj,quad, With Preservative 01/08/2019  . Influenza-Unspecified 03/14/2014, 02/07/2018, 01/31/2020  . PFIZER(Purple Top)SARS-COV-2 Vaccination 05/16/2019, 06/06/2019, 02/10/2020  . Pneumococcal Conjugate-13 01/21/2014  . Pneumococcal Polysaccharide-23 03/09/2008  . Tdap 01/21/2014, 11/29/2016  . Zoster 01/23/2015  . Zoster Recombinat (Shingrix) 12/25/2018, 04/02/2019    TDAP status: Up to date  Flu Vaccine status: Up to date  Pneumococcal vaccine status: Up to date  Covid-19 vaccine status: Completed vaccines  Qualifies for Shingles Vaccine? Yes   Zostavax completed Yes   Shingrix Completed?: Yes  Screening Tests Health Maintenance  Topic Date Due  . Hepatitis C Screening  Never done  . COVID-19 Vaccine (4 - Booster for Pfizer series) 08/10/2020  . TETANUS/TDAP  11/30/2026  . INFLUENZA VACCINE  Completed  . PNA vac Low Risk Adult  Completed  . HPV VACCINES  Aged Out    Health Maintenance  Health Maintenance Due  Topic Date Due  . Hepatitis C Screening  Never done    Colorectal cancer screening: No longer required.   Lung Cancer Screening: (Low Dose CT Chest recommended if Age 22-80 years, 30 pack-year currently smoking OR have quit  w/in 15years.) does not qualify.   Lung Cancer Screening Referral: no  Additional Screening:  Hepatitis C Screening: does qualify; due  Vision Screening: Recommended annual ophthalmology exams for early detection of glaucoma and other disorders of the eye. Is the patient up to date with their annual eye exam?  Yes  Who is the provider or what is the name of the office in which the patient attends annual eye exams? Dr. Ellin Mayhew If pt is not established with a provider, would they like to be referred to a provider to establish care? No .   Dental Screening: Recommended annual dental exams for proper oral hygiene  Community Resource Referral / Chronic Care Management: CRR required this visit?  No   CCM required this visit?  No      Plan:     I have personally reviewed and noted the following in the patient's chart:   . Medical and social history . Use of alcohol, tobacco or illicit drugs  . Current medications and supplements . Functional ability and status . Nutritional status . Physical activity . Advanced directives . List of other physicians . Hospitalizations, surgeries, and ER visits in previous 12 months . Vitals . Screenings to include cognitive, depression, and falls . Referrals and appointments  In addition, I have reviewed and discussed with patient certain preventive protocols, quality metrics, and best practice recommendations. A written personalized care plan for preventive services as well as general preventive health recommendations were provided to patient.     Kellie Simmering, LPN   9/81/1914   Nurse Notes:

## 2020-08-04 NOTE — Patient Instructions (Signed)
Kyle Vargas , Thank you for taking time to come for your Medicare Wellness Visit. I appreciate your ongoing commitment to your health goals. Please review the following plan we discussed and let me know if I can assist you in the future.   Screening recommendations/referrals: Colonoscopy: not required Recommended yearly ophthalmology/optometry visit for glaucoma screening and checkup Recommended yearly dental visit for hygiene and checkup  Vaccinations: Influenza vaccine: completed 01/31/2020, due 12/07/2020 Pneumococcal vaccine: completed 01/21/2014 Tdap vaccine: completed 11/29/2016, due 11/30/2026 Shingles vaccine: completed   Covid-19:  02/10/2020, 06/06/2019, 05/16/2019  Advanced directives: copy in chart  Conditions/risks identified: none  Next appointment: Follow up in one year for your annual wellness visit.   Preventive Care 17 Years and Older, Male Preventive care refers to lifestyle choices and visits with your health care provider that can promote health and wellness. What does preventive care include?  A yearly physical exam. This is also called an annual well check.  Dental exams once or twice a year.  Routine eye exams. Ask your health care provider how often you should have your eyes checked.  Personal lifestyle choices, including:  Daily care of your teeth and gums.  Regular physical activity.  Eating a healthy diet.  Avoiding tobacco and drug use.  Limiting alcohol use.  Practicing safe sex.  Taking low doses of aspirin every day.  Taking vitamin and mineral supplements as recommended by your health care provider. What happens during an annual well check? The services and screenings done by your health care provider during your annual well check will depend on your age, overall health, lifestyle risk factors, and family history of disease. Counseling  Your health care provider may ask you questions about your:  Alcohol use.  Tobacco use.  Drug  use.  Emotional well-being.  Home and relationship well-being.  Sexual activity.  Eating habits.  History of falls.  Memory and ability to understand (cognition).  Work and work Statistician. Screening  You may have the following tests or measurements:  Height, weight, and BMI.  Blood pressure.  Lipid and cholesterol levels. These may be checked every 5 years, or more frequently if you are over 87 years old.  Skin check.  Lung cancer screening. You may have this screening every year starting at age 23 if you have a 30-pack-year history of smoking and currently smoke or have quit within the past 15 years.  Fecal occult blood test (FOBT) of the stool. You may have this test every year starting at age 30.  Flexible sigmoidoscopy or colonoscopy. You may have a sigmoidoscopy every 5 years or a colonoscopy every 10 years starting at age 7.  Prostate cancer screening. Recommendations will vary depending on your family history and other risks.  Hepatitis C blood test.  Hepatitis B blood test.  Sexually transmitted disease (STD) testing.  Diabetes screening. This is done by checking your blood sugar (glucose) after you have not eaten for a while (fasting). You may have this done every 1-3 years.  Abdominal aortic aneurysm (AAA) screening. You may need this if you are a current or former smoker.  Osteoporosis. You may be screened starting at age 27 if you are at high risk. Talk with your health care provider about your test results, treatment options, and if necessary, the need for more tests. Vaccines  Your health care provider may recommend certain vaccines, such as:  Influenza vaccine. This is recommended every year.  Tetanus, diphtheria, and acellular pertussis (Tdap, Td) vaccine. You may  need a Td booster every 10 years.  Zoster vaccine. You may need this after age 23.  Pneumococcal 13-valent conjugate (PCV13) vaccine. One dose is recommended after age  10.  Pneumococcal polysaccharide (PPSV23) vaccine. One dose is recommended after age 44. Talk to your health care provider about which screenings and vaccines you need and how often you need them. This information is not intended to replace advice given to you by your health care provider. Make sure you discuss any questions you have with your health care provider. Document Released: 05/22/2015 Document Revised: 01/13/2016 Document Reviewed: 02/24/2015 Elsevier Interactive Patient Education  2017 [employer] Prevention in the Home Falls can cause injuries. They can happen to people of all ages. There are many things you can do to make your home safe and to help prevent falls. What can I do on the outside of my home?  Regularly fix the edges of walkways and driveways and fix any cracks.  Remove anything that might make you trip as you walk through a door, such as a raised step or threshold.  Trim any bushes or trees on the path to your home.  Use bright outdoor lighting.  Clear any walking paths of anything that might make someone trip, such as rocks or tools.  Regularly check to see if handrails are loose or broken. Make sure that both sides of any steps have handrails.  Any raised decks and porches should have guardrails on the edges.  Have any leaves, snow, or ice cleared regularly.  Use sand or salt on walking paths during winter.  Clean up any spills in your garage right away. This includes oil or grease spills. What can I do in the bathroom?  Use night lights.  Install grab bars by the toilet and in the tub and shower. Do not use towel bars as grab bars.  Use non-skid mats or decals in the tub or shower.  If you need to sit down in the shower, use a plastic, non-slip stool.  Keep the floor dry. Clean up any water that spills on the floor as soon as it happens.  Remove soap buildup in the tub or shower regularly.  Attach bath mats securely with double-sided  non-slip rug tape.  Do not have throw rugs and other things on the floor that can make you trip. What can I do in the bedroom?  Use night lights.  Make sure that you have a light by your bed that is easy to reach.  Do not use any sheets or blankets that are too big for your bed. They should not hang down onto the floor.  Have a firm chair that has side arms. You can use this for support while you get dressed.  Do not have throw rugs and other things on the floor that can make you trip. What can I do in the kitchen?  Clean up any spills right away.  Avoid walking on wet floors.  Keep items that you use a lot in easy-to-reach places.  If you need to reach something above you, use a strong step stool that has a grab bar.  Keep electrical cords out of the way.  Do not use floor polish or wax that makes floors slippery. If you must use wax, use non-skid floor wax.  Do not have throw rugs and other things on the floor that can make you trip. What can I do with my stairs?  Do not leave any items on  the stairs.  Make sure that there are handrails on both sides of the stairs and use them. Fix handrails that are broken or loose. Make sure that handrails are as long as the stairways.  Check any carpeting to make sure that it is firmly attached to the stairs. Fix any carpet that is loose or worn.  Avoid having throw rugs at the top or bottom of the stairs. If you do have throw rugs, attach them to the floor with carpet tape.  Make sure that you have a light switch at the top of the stairs and the bottom of the stairs. If you do not have them, ask someone to add them for you. What else can I do to help prevent falls?  Wear shoes that:  Do not have high heels.  Have rubber bottoms.  Are comfortable and fit you well.  Are closed at the toe. Do not wear sandals.  If you use a stepladder:  Make sure that it is fully opened. Do not climb a closed stepladder.  Make sure that both  sides of the stepladder are locked into place.  Ask someone to hold it for you, if possible.  Clearly mark and make sure that you can see:  Any grab bars or handrails.  First and last steps.  Where the edge of each step is.  Use tools that help you move around (mobility aids) if they are needed. These include:  Canes.  Walkers.  Scooters.  Crutches.  Turn on the lights when you go into a dark area. Replace any light bulbs as soon as they burn out.  Set up your furniture so you have a clear path. Avoid moving your furniture around.  If any of your floors are uneven, fix them.  If there are any pets around you, be aware of where they are.  Review your medicines with your doctor. Some medicines can make you feel dizzy. This can increase your chance of falling. Ask your doctor what other things that you can do to help prevent falls. This information is not intended to replace advice given to you by your health care provider. Make sure you discuss any questions you have with your health care provider. Document Released: 02/19/2009 Document Revised: 10/01/2015 Document Reviewed: 05/30/2014 Elsevier Interactive Patient Education  2017 [employer].

## 2020-08-11 DIAGNOSIS — D225 Melanocytic nevi of trunk: Secondary | ICD-10-CM | POA: Diagnosis not present

## 2020-08-11 DIAGNOSIS — Z85828 Personal history of other malignant neoplasm of skin: Secondary | ICD-10-CM | POA: Diagnosis not present

## 2020-08-11 DIAGNOSIS — D2261 Melanocytic nevi of right upper limb, including shoulder: Secondary | ICD-10-CM | POA: Diagnosis not present

## 2020-08-11 DIAGNOSIS — L570 Actinic keratosis: Secondary | ICD-10-CM | POA: Diagnosis not present

## 2020-08-11 DIAGNOSIS — D2271 Melanocytic nevi of right lower limb, including hip: Secondary | ICD-10-CM | POA: Diagnosis not present

## 2020-08-11 DIAGNOSIS — D2262 Melanocytic nevi of left upper limb, including shoulder: Secondary | ICD-10-CM | POA: Diagnosis not present

## 2020-09-02 ENCOUNTER — Encounter: Payer: Self-pay | Admitting: Family Medicine

## 2020-09-02 ENCOUNTER — Other Ambulatory Visit: Payer: Self-pay

## 2020-09-02 ENCOUNTER — Ambulatory Visit (INDEPENDENT_AMBULATORY_CARE_PROVIDER_SITE_OTHER): Payer: Medicare HMO | Admitting: Family Medicine

## 2020-09-02 VITALS — BP 154/60 | HR 68 | Ht 68.0 in | Wt 184.0 lb

## 2020-09-02 DIAGNOSIS — H6123 Impacted cerumen, bilateral: Secondary | ICD-10-CM | POA: Diagnosis not present

## 2020-09-02 DIAGNOSIS — H9313 Tinnitus, bilateral: Secondary | ICD-10-CM

## 2020-09-02 NOTE — Progress Notes (Signed)
Subjective:    Patient ID: Kyle Vargas., male    DOB: 1940-12-02, 80 y.o.   MRN: [id number]  Kyle Vargas. is a 80 y.o. male presenting on 09/02/2020 for Ear Fullness (Ears are "stopped up" and its causing difficulty with hearing. Started 2 weeks ago. Becoming worse. Sounds sound like he is "in a tunnel." )   HPI   Cerumen Impaction / Bilateral Hearing Loss / Pressure Tinnitus Reports recent flare up, of similar problem in past. He has had ears flushed before. Now has worsening pressure stopped up ear feeling past 2 weeks with reduced hearing sounds like in a tunnel muffled sounds. Tried OTC ear lavage kit to flush ears. He has seen Dr Sonny Dandy ENT in past. - Admits ear pressure bilateral - Denies any ear pain, or drainage   Depression screen [facility] 2/9 09/02/2020 08/04/2020 10/21/2019  Decreased Interest 0 0 0  Down, Depressed, Hopeless 0 0 0  PHQ - 2 Score 0 0 0  Altered sleeping 1 - -  Tired, decreased energy 1 - -  Change in appetite 0 - -  Feeling bad or failure about yourself  0 - -  Trouble concentrating 0 - -  Moving slowly or fidgety/restless 0 - -  Suicidal thoughts 0 - -  PHQ-9 Score 2 - -  Difficult doing work/chores Not difficult at all - -    Social History   Tobacco Use  . Smoking status: Never Smoker  . Smokeless tobacco: Never Used  Vaping Use  . Vaping Use: Never used  Substance Use Topics  . Alcohol use: Yes    Comment: occasionally  2 glasses of wine a month   . Drug use: No    Review of Systems Per HPI unless specifically indicated above     Objective:    BP (!) 154/60   Pulse 68   Ht 5' 8"  (1.727 m)   Wt 184 lb (83.5 kg)   SpO2 99%   BMI 27.98 kg/m   Wt Readings from Last 3 Encounters:  09/02/20 184 lb (83.5 kg)  08/04/20 180 lb (81.6 kg)  05/27/20 178 lb (80.7 kg)    Physical Exam Vitals and nursing note reviewed.  Constitutional:      General: He is not in acute distress.    Appearance: He is well-developed. He  is not diaphoretic.     Comments: Well-appearing, comfortable, cooperative  HENT:     Head: Normocephalic and atraumatic.     Right Ear: There is impacted cerumen.     Left Ear: There is impacted cerumen.  Eyes:     General:        Right eye: No discharge.        Left eye: No discharge.     Conjunctiva/sclera: Conjunctivae normal.  Cardiovascular:     Rate and Rhythm: Normal rate.  Pulmonary:     Effort: Pulmonary effort is normal.  Skin:    General: Skin is warm and dry.     Findings: No erythema or rash.  Neurological:     Mental Status: He is alert and oriented to person, place, and time.  Psychiatric:        Behavior: Behavior normal.     Comments: Well groomed, good eye contact, normal speech and thoughts    ________________________________________________________ PROCEDURE NOTE Date: 09/02/20 Bilateral Ear Lavage / Cerumen Removal Discussed benefits and risks (including pain / discomforts, dizziness, minor abrasion of ear canal). Verbal consent given  by patient. Medication:  carbamide peroxide ear drops, Ear Lavage Solution (warm water + hydrogen peroxide) Performed by Dr Parks Ranger, Jacqulyn [facility] CMA Several drops of carbamide peroxide placed in each ear, allowed to sit for few minutes. Ear lavage solution flushed into one ear at a time in attempt to dislodge and remove ear wax. Results successful. Additional light up curette device with Bionix used tolerated well.  Repeat Ear Exam: - Completely removed cerumen now, with clear ear canals and visible TMs clear and normal appearance.    Results for orders placed or performed in visit on 10/21/19  PSA  Result Value Ref Range   PSA 2.3       Assessment & Plan:   Problem List Items Addressed This Visit   None   Visit Diagnoses    Bilateral hearing loss due to cerumen impaction    -  Primary   Tinnitus of both ears          Significant amount of large thick impacted cerumen bilaterally Suspected primary  cause of current reduced bilateral hearing Also assoc tinnitus - Consider presbycusis in 79 yr patient, but seems less likely given history of more recent change and not gradual loss. Prior eval by Dr Sonny Dandy ENT in past.  Plan: 1. Successful office ear lavage cerumen removal today, re-evaluated with clear ear canals and normal TMs 2. May use Debrox OTC drops / flushing kit if need again 3. Also discussed if Tinnitus does not resolve now after wax removal, he can return to follow-up with  ENT Dr Richardson Landry in future as indicated  Follow-up as needed   No orders of the defined types were placed in this encounter.     Follow up plan: Return if symptoms worsen or fail to improve.    Nobie Putnam, [facility] 09/02/2020, 1:48 PM

## 2020-09-02 NOTE — Patient Instructions (Addendum)
You have thick impacted ear wax (cerumen) blocking ear canals and ear drums. This is the most likely cause of reduced hearing and ear pain and discomfort. - We were able to remove almost all of the ear wax with flushing in the office today  Recommend over the counter Debrox (Carbamide peroxide), use on both sides following instructions on bottle, pharmacist will direct you to the appropriate ear drops if you need help. May take a week or more.  Avoid using Q-tips inside ears, as this can push wax deeper, but you can try to use rolled up kleenex as a wick to absorb fluid and wax as well.  If you are not making progress with ear wax removal at home, or the problem keeps coming back, please notify our office or return for re-evaluation, and we can discuss referral to ENT office for more formal ear wax removal.  [address]  [place], [place] [address] Ph: [phone]  If not improved Tinnitus or humming/buzzing sounds can contact Dr Richardson Landry and follow-up with them for hearing eval.   Please schedule a Follow-up Appointment to: Return if symptoms worsen or fail to improve.  If you have any other questions or concerns, please feel free to call the office or send a message through [employer]. You may also schedule an earlier appointment if necessary.  Additionally, you may be receiving a survey about your experience at our office within a few days to 1 week by e-mail or mail. We value your feedback.  Nobie Putnam, DO [facility]

## 2020-09-25 DIAGNOSIS — J301 Allergic rhinitis due to pollen: Secondary | ICD-10-CM | POA: Diagnosis not present

## 2020-09-25 DIAGNOSIS — H9313 Tinnitus, bilateral: Secondary | ICD-10-CM | POA: Diagnosis not present

## 2020-09-25 DIAGNOSIS — H6983 Other specified disorders of Eustachian tube, bilateral: Secondary | ICD-10-CM | POA: Diagnosis not present

## 2020-09-26 DIAGNOSIS — S92001A Unspecified fracture of right calcaneus, initial encounter for closed fracture: Secondary | ICD-10-CM | POA: Diagnosis not present

## 2020-09-28 ENCOUNTER — Ambulatory Visit (INDEPENDENT_AMBULATORY_CARE_PROVIDER_SITE_OTHER): Payer: Medicare HMO | Admitting: General Practice

## 2020-09-28 ENCOUNTER — Telehealth: Payer: Medicare HMO | Admitting: General Practice

## 2020-09-28 DIAGNOSIS — I10 Essential (primary) hypertension: Secondary | ICD-10-CM

## 2020-09-28 DIAGNOSIS — E782 Mixed hyperlipidemia: Secondary | ICD-10-CM

## 2020-09-28 NOTE — Chronic Care Management (AMB) (Signed)
Chronic Care Management   CCM RN Visit Note  09/28/2020 Name: Kyle Vargas [employer]. MRN: [id number] DOB: 11-30-1940  Subjective: Kyle Vargas Kyle Vargas. is a 80 y.o. year old male who is a primary care patient of [name], DO. The care management team was consulted for assistance with disease management and care coordination needs.    Engaged with patient by telephone for follow up visit in response to provider referral for case management and/or care coordination services.   Consent to Services:  The patient was given information about Chronic Care Management services, agreed to services, and gave verbal consent prior to initiation of services.  Please see initial visit note for detailed documentation.   Patient agreed to services and verbal consent obtained.   Assessment: Review of patient past medical history, allergies, medications, health status, including review of consultants reports, laboratory and other test data, was performed as part of comprehensive evaluation and provision of chronic care management services.   SDOH (Social Determinants of Health) assessments and interventions performed:    CCM Care Plan  Allergies  Allergen Reactions  . Dust Mite Extract     eye itching  . Other Other (See Comments)    Dogs and cats, itching eyes, sneezing     Outpatient Encounter Medications as of 09/28/2020  Medication Sig Note  . aspirin 81 MG tablet Take 81 mg by mouth daily. Takes 5 days a week   . azelastine (ASTELIN) 0.1 % nasal spray Place 1 spray into both nostrils 2 (two) times daily.    . Blood Pressure Monitoring (BLOOD PRESSURE CUFF) MISC 1 Device by Does not apply route daily. Normal upper arm BP cuff   . diltiazem (CARDIZEM CD) 240 MG 24 hr capsule Take 1 capsule (240 mg total) by mouth every morning.   . Garlic 10 MG CAPS Take by mouth. Unsure of dose   . glucose blood test strip ONETOUCH ULTRA BLUE (In Vitro Strip)  1 (one) Strip Strip check blood sugar once  daily for 0 days  Quantity: 50;  Refills: 12   Ordered :02-Oct-2013  [name];  Started 03-Jul-2013 Active 08/09/2014: Received from: [employer]  . glucose blood test strip Use as instructed   . hydrochlorothiazide (HYDRODIURIL) 25 MG tablet Take 1 tablet (25 mg total) by mouth daily.   Marland Kitchen ipratropium (ATROVENT) 0.06 % nasal spray Place 2 sprays into both nostrils 4 (four) times daily. For up to 5-7 days then stop.   Marland Kitchen losartan (COZAAR) 100 MG tablet Take 1 tablet (100 mg total) by mouth daily.   . metoprolol tartrate (LOPRESSOR) 25 MG tablet Take 1.5 tablets (37.5 mg total) by mouth 2 (two) times daily.   . Omega-3 Fatty Acids (OMEGA-3 FISH OIL) 1200 MG CAPS Take 1 capsule (1,200 mg total) by mouth 3 (three) times daily. 12/25/2018: 3638m daily   . Sodium Chloride-Sodium Bicarb 1.57 g PACK Place into the nose as needed.  12/25/2018: As needed   . tamsulosin (FLOMAX) 0.4 MG CAPS capsule Take 0.4 mg by mouth daily.    No facility-administered encounter medications on file as of 09/28/2020.    Patient Active Problem List   Diagnosis Date Noted  . Hyperlipidemia 02/15/2017  . BPH with obstruction/lower urinary tract symptoms 11/07/2016  . History of nonmelanoma skin cancer 11/07/2016  . Pre-diabetes 10/26/2015  . PVC's (premature ventricular contractions)   . Essential hypertension     Conditions to be addressed/monitored:HTN and HLD  Care Plan : RNCM:  Management of Hypertension  Updates made by Vanita Ingles since 09/28/2020 12:00 AM    Problem: RNCM: Hypertension   Priority: Medium    Long-Range Goal: RNCM: Management of HTN   Priority: Medium  Note:   Objective:  . Last practice recorded BP readings:  . BP Readings from Last 3 Encounters: .  09/02/20 . (!) 154/60 .  05/27/20 . 138/64 .  02/20/20 . 122/60 .     Marland Kitchen Most recent eGFR/CrCl: No results found for: EGFR  No components found for: CRCL Current Barriers:  Marland Kitchen Knowledge Deficits related to basic  understanding of hypertension  pathophysiology and self care management . Unable to independently manage HTN  Case Manager Clinical Goal(s):  Marland Kitchen Over the next 120 days, patient will verbalize understanding of plan for hypertension management . Over the next 120 days, patient will attend all scheduled medical appointments: 10-22-2020 with the pcp . Over the next 120 days, patient will demonstrate improved adherence to prescribed treatment plan for hypertension as evidenced by taking all medications as prescribed, monitoring and recording blood pressure as directed, adhering to low sodium/DASH diet . Over the next 120 days, patient will demonstrate improved health management independence as evidenced by checking blood pressure as directed and notifying PCP if SBP>160 or DBP > 90, taking all medications as prescribe, and adhering to a low sodium diet as discussed. Interventions:  . Evaluation of current treatment plan related to hypertension self management and patient's adherence to plan as established by provider. 07-20-2020: The patient is doing well states that after seeing pcp on 05-27-2020 by video visit his wife tested positive for COVID and then he tested positive for COVID. He finished the course of antibiotics and also other recommendations and said sx/sx lasted about 5 days. He denies any complications at this time. Will continue to monitor. 09-28-2020: The patients blood pressure was elevated in the office recently. States it has been WNL at home. The patient  has had some issues with his sinuses and ears being stopped up. Seeing ENT for follow up. Also had a recent fracture to his ankle and is in a boot. Will see the specialist on Wednesday for follow up and treatment.  . Provided education to patient re: stroke prevention, s/s of heart attack and stroke, DASH diet, complications of uncontrolled blood pressure. 09-28-2020: Review and education  . Reviewed medications with patient and discussed  importance of compliance.  The patient states he is doing well with managing his medications. 09-28-2020: The patient is eating well and denies any new concerns with his dietary intake. Is enjoying fresh vegetables from his garden.  . Discussed plans with patient for ongoing care management follow up and provided patient with direct contact information for care management team . Advised patient, providing education and rationale, to monitor blood pressure daily and record, calling PCP for findings outside established parameters. 07-20-2020: The patient continue to take his blood pressure at home. States last week he had an episode of it being low at 118/61. States he noticed a little light headedness and dizziness but it resolved.  Review of orthostatic hypotension. 09-28-2020: States he is having normal blood pressure readings at home. The patient states that he feels fine.  . Reviewed scheduled/upcoming provider appointments including: 10-22-2020 . - blood pressure trends reviewed . - depression screen reviewed . - home or ambulatory blood pressure monitoring encouraged Patient Goals/Self-Care Activities . Over the next 120 days, patient will:  - Calls provider office for new concerns,  questions, or BP outside discussed parameters Checks BP and records as discussed Follows a low sodium diet/DASH diet Follow Up Plan: Telephone follow up appointment with care management team member scheduled for:  12-07-2020 at 1:45 pm   Care Plan : RNCM: HLD Management  Updates made by Vanita Ingles since 09/28/2020 12:00 AM    Problem: RNCM: HLD Management   Priority: Medium    Long-Range Goal: RNCM: HLD Management   Priority: Medium  Note:   Current Barriers:  . Poorly controlled hyperlipidemia, complicated by HTN and recent COVID infection  . Current antihyperlipidemic regimen: Omega 3 1200 mg  . Most recent lipid panel:     Component Value Date/Time   CHOL 148 10/14/2019 0807   TRIG 226 (H) 10/14/2019  0807   HDL 38 (L) 10/14/2019 0807   CHOLHDL 3.9 10/14/2019 0807   VLDL 45 (H) 07/06/2016 0001   LDLCALC 79 10/14/2019 0807 .   Marland Kitchen ASCVD risk enhancing conditions: age >59, pre-DM, HTN . Lacks social connections . Does not contact provider office for questions/concerns RN Care Manager Clinical Goal(s):  . patient will work with Consulting civil engineer, providers, and care team towards execution of optimized self-health management plan . patient will verbalize understanding of plan for effective management of HLD  . patient will work with [facility] and pcp  to address needs related to effective management of HLD  . patient will attend all scheduled medical appointments: 10-22-2020 at 1 pm Interventions: . Collaboration with [name], DO regarding development and update of comprehensive plan of care as evidenced by provider attestation and co-signature . Inter-disciplinary care team collaboration (see longitudinal plan of care) . Medication review performed; medication list updated in electronic medical record. 09-28-2020: The patient is compliant with his medications  . Inter-disciplinary care team collaboration (see longitudinal plan of care) . Referred to pharmacy team for assistance with HLD medication management . Evaluation of current treatment plan related to HLD and patient's adherence to plan as established by provider. 09-28-2020: The patient is compliant with the plan of care for HLD. Compliant with heart healthy diet and medications.  . Advised patient to call the office for concerns or questions  . Provided education to patient re: heart healthy diet, taking medications as directed, and working with the CCM team to manage health and well being  . Reviewed medications with patient and discussed compliance  . Reviewed scheduled/upcoming provider appointments including: [phone] pm  . Discussed plans with patient for ongoing care management follow up and provided patient with direct  contact information for care management team Patient Goals/Self-Care Activities: - call for medicine refill 2 or 3 days before it runs out - call if I am sick and can't take my medicine - keep a list of all the medicines I take; vitamins and herbals too - learn to read medicine labels - use a pillbox to sort medicine - use an alarm clock or phone to remind me to take my medicine - change to whole grain breads, cereal, pasta - drink 6 to 8 glasses of water each day - eat 3 to 5 servings of fruits and vegetables each day - eat 5 or 6 small meals each day - eat fish at least once per week - fill half the plate with nonstarchy vegetables - limit fast food meals to no more than 1 per week - manage portion size - prepare main meal at home 3 to 5 days each week - read food labels for  fat, fiber, carbohydrates and portion size - reduce red meat to 2 to 3 times a week - set a realistic goal - be open to making changes - I can manage, know and watch for signs of a heart attack - if I have chest pain, call for help - learn about small changes that will make a big difference - learn my personal risk factors - barriers to meeting goals identified - change-talk evoked - choices provided - collaboration with team encouraged - decision-making supported - difficulty of making life-long changes acknowledged - health risks reviewed - problem-solving facilitated - questions answered - readiness for change evaluated - reassurance provided - self-reflection promoted - self-reliance encouraged  Follow Up Plan: Telephone follow up appointment with care management team member scheduled for:12-07-2020 at  1:45 pm       Plan:Telephone follow up appointment with care management team member scheduled for:  12-07-2020 at 1:45 pm  [name], MSN, [place] [facility] [employer]: [phone]

## 2020-09-28 NOTE — Patient Instructions (Signed)
Visit Information  PATIENT GOALS: Patient Care Plan: RNCM: Management of Hypertension    Problem Identified: RNCM: Hypertension   Priority: Medium    Long-Range Goal: RNCM: Management of HTN   Priority: Medium  Note:   Objective:  . Last practice recorded BP readings:  . BP Readings from Last 3 Encounters: .  09/02/20 . (!) 154/60 .  05/27/20 . 138/64 .  02/20/20 . 122/60 .     Marland Kitchen Most recent eGFR/CrCl: No results found for: EGFR  No components found for: CRCL Current Barriers:  Marland Kitchen Knowledge Deficits related to basic understanding of hypertension  pathophysiology and self care management . Unable to independently manage HTN  Case Manager Clinical Goal(s):  Marland Kitchen Over the next 120 days, patient will verbalize understanding of plan for hypertension management . Over the next 120 days, patient will attend all scheduled medical appointments: 10-22-2020 with the pcp . Over the next 120 days, patient will demonstrate improved adherence to prescribed treatment plan for hypertension as evidenced by taking all medications as prescribed, monitoring and recording blood pressure as directed, adhering to low sodium/DASH diet . Over the next 120 days, patient will demonstrate improved health management independence as evidenced by checking blood pressure as directed and notifying PCP if SBP>160 or DBP > 90, taking all medications as prescribe, and adhering to a low sodium diet as discussed. Interventions:  . Evaluation of current treatment plan related to hypertension self management and patient's adherence to plan as established by provider. 07-20-2020: The patient is doing well states that after seeing pcp on 05-27-2020 by video visit his wife tested positive for COVID and then he tested positive for COVID. He finished the course of antibiotics and also other recommendations and said sx/sx lasted about 5 days. He denies any complications at this time. Will continue to monitor. 09-28-2020: The patients blood  pressure was elevated in the office recently. States it has been WNL at home. The patient  has had some issues with his sinuses and ears being stopped up. Seeing ENT for follow up. Also had a recent fracture to his ankle and is in a boot. Will see the specialist on Wednesday for follow up and treatment.  . Provided education to patient re: stroke prevention, s/s of heart attack and stroke, DASH diet, complications of uncontrolled blood pressure. 09-28-2020: Review and education  . Reviewed medications with patient and discussed importance of compliance.  The patient states he is doing well with managing his medications. 09-28-2020: The patient is eating well and denies any new concerns with his dietary intake. Is enjoying fresh vegetables from his garden.  . Discussed plans with patient for ongoing care management follow up and provided patient with direct contact information for care management team . Advised patient, providing education and rationale, to monitor blood pressure daily and record, calling PCP for findings outside established parameters. 07-20-2020: The patient continue to take his blood pressure at home. States last week he had an episode of it being low at 118/61. States he noticed a little light headedness and dizziness but it resolved.  Review of orthostatic hypotension. 09-28-2020: States he is having normal blood pressure readings at home. The patient states that he feels fine.  . Reviewed scheduled/upcoming provider appointments including: 10-22-2020 . - blood pressure trends reviewed . - depression screen reviewed . - home or ambulatory blood pressure monitoring encouraged Patient Goals/Self-Care Activities . Over the next 120 days, patient will:  - Calls provider office for new concerns, questions, or  BP outside discussed parameters Checks BP and records as discussed Follows a low sodium diet/DASH diet Follow Up Plan: Telephone follow up appointment with care management team member  scheduled for:  12-07-2020 at 1:45 pm   Task: RNCM: Identify and Monitor Blood Pressure Elevation   Note:   Care Management Activities:    - blood pressure trends reviewed - depression screen reviewed - home or ambulatory blood pressure monitoring encouraged       Patient Care Plan: RNCM: HLD Management    Problem Identified: RNCM: HLD Management   Priority: Medium    Long-Range Goal: RNCM: HLD Management   Priority: Medium  Note:   Current Barriers:  . Poorly controlled hyperlipidemia, complicated by HTN and recent COVID infection  . Current antihyperlipidemic regimen: Omega 3 1200 mg  . Most recent lipid panel:     Component Value Date/Time   CHOL 148 10/14/2019 0807   TRIG 226 (H) 10/14/2019 0807   HDL 38 (L) 10/14/2019 0807   CHOLHDL 3.9 10/14/2019 0807   VLDL 45 (H) 07/06/2016 0001   LDLCALC 79 10/14/2019 0807 .   Marland Kitchen ASCVD risk enhancing conditions: age >73, pre-DM, HTN . Lacks social connections . Does not contact provider office for questions/concerns RN Care Manager Clinical Goal(s):  . patient will work with Consulting civil engineer, providers, and care team towards execution of optimized self-health management plan . patient will verbalize understanding of plan for effective management of HLD  . patient will work with [facility] and pcp  to address needs related to effective management of HLD  . patient will attend all scheduled medical appointments: 10-22-2020 at 1 pm Interventions: . Collaboration with [name], DO regarding development and update of comprehensive plan of care as evidenced by provider attestation and co-signature . Inter-disciplinary care team collaboration (see longitudinal plan of care) . Medication review performed; medication list updated in electronic medical record. 09-28-2020: The patient is compliant with his medications  . Inter-disciplinary care team collaboration (see longitudinal plan of care) . Referred to pharmacy team for  assistance with HLD medication management . Evaluation of current treatment plan related to HLD and patient's adherence to plan as established by provider. 09-28-2020: The patient is compliant with the plan of care for HLD. Compliant with heart healthy diet and medications.  . Advised patient to call the office for concerns or questions  . Provided education to patient re: heart healthy diet, taking medications as directed, and working with the CCM team to manage health and well being  . Reviewed medications with patient and discussed compliance  . Reviewed scheduled/upcoming provider appointments including: [phone] pm  . Discussed plans with patient for ongoing care management follow up and provided patient with direct contact information for care management team Patient Goals/Self-Care Activities: - call for medicine refill 2 or 3 days before it runs out - call if I am sick and can't take my medicine - keep a list of all the medicines I take; vitamins and herbals too - learn to read medicine labels - use a pillbox to sort medicine - use an alarm clock or phone to remind me to take my medicine - change to whole grain breads, cereal, pasta - drink 6 to 8 glasses of water each day - eat 3 to 5 servings of fruits and vegetables each day - eat 5 or 6 small meals each day - eat fish at least once per week - fill half the plate with nonstarchy vegetables - limit fast  food meals to no more than 1 per week - manage portion size - prepare main meal at home 3 to 5 days each week - read food labels for fat, fiber, carbohydrates and portion size - reduce red meat to 2 to 3 times a week - set a realistic goal - be open to making changes - I can manage, know and watch for signs of a heart attack - if I have chest pain, call for help - learn about small changes that will make a big difference - learn my personal risk factors - barriers to meeting goals identified - change-talk evoked - choices  provided - collaboration with team encouraged - decision-making supported - difficulty of making life-long changes acknowledged - health risks reviewed - problem-solving facilitated - questions answered - readiness for change evaluated - reassurance provided - self-reflection promoted - self-reliance encouraged  Follow Up Plan: Telephone follow up appointment with care management team member scheduled for:12-07-2020 at  1:45 pm     Task: RNCM: HLD Management   Note:   Care Management Activities:    - barriers to meeting goals identified - change-talk evoked - choices provided - collaboration with team encouraged - decision-making supported - difficulty of making life-long changes acknowledged - health risks reviewed - problem-solving facilitated - questions answered - readiness for change evaluated - reassurance provided - self-reflection promoted - self-reliance encouraged         Patient verbalizes understanding of instructions provided today and agrees to view in [place].   Telephone follow up appointment with care management team member scheduled for: 12-07-2020 at 1:45 pm  [name], MSN, [place] Mobile: [phone]

## 2020-09-30 DIAGNOSIS — S92001A Unspecified fracture of right calcaneus, initial encounter for closed fracture: Secondary | ICD-10-CM | POA: Diagnosis not present

## 2020-10-15 ENCOUNTER — Other Ambulatory Visit: Payer: Medicare Other

## 2020-10-15 DIAGNOSIS — Z0000 Encounter for general adult medical examination without abnormal findings: Secondary | ICD-10-CM | POA: Diagnosis not present

## 2020-10-15 DIAGNOSIS — I10 Essential (primary) hypertension: Secondary | ICD-10-CM | POA: Diagnosis not present

## 2020-10-15 DIAGNOSIS — E782 Mixed hyperlipidemia: Secondary | ICD-10-CM | POA: Diagnosis not present

## 2020-10-15 DIAGNOSIS — R7303 Prediabetes: Secondary | ICD-10-CM | POA: Diagnosis not present

## 2020-10-16 LAB — COMPLETE METABOLIC PANEL WITH GFR
AG Ratio: 1.5 (calc) (ref 1.0–2.5)
ALT: 33 U/L (ref 9–46)
AST: 24 U/L (ref 10–35)
Albumin: 4.1 g/dL (ref 3.6–5.1)
Alkaline phosphatase (APISO): 104 U/L (ref 35–144)
BUN: 17 mg/dL (ref 7–25)
CO2: 27 mmol/L (ref 20–32)
Calcium: 9.4 mg/dL (ref 8.6–10.3)
Chloride: 103 mmol/L (ref 98–110)
Creat: 1.12 mg/dL (ref 0.70–1.18)
GFR, Est African American: 73 mL/min/{1.73_m2} (ref >=60)
GFR, Est Non African American: 63 mL/min/{1.73_m2} (ref >=60)
Globulin: 2.7 g/dL (calc) (ref 1.9–3.7)
Glucose, Bld: 112 mg/dL — ABNORMAL HIGH (ref 65–99)
Potassium: 3.3 mmol/L — ABNORMAL LOW (ref 3.5–5.3)
Sodium: 141 mmol/L (ref 135–146)
Total Bilirubin: 0.4 mg/dL (ref 0.2–1.2)
Total Protein: 6.8 g/dL (ref 6.1–8.1)

## 2020-10-16 LAB — CBC WITH DIFFERENTIAL/PLATELET
Absolute Monocytes: 662 cells/uL (ref 200–950)
Basophils Absolute: 23 cells/uL (ref 0–200)
Basophils Relative: 0.3 %
Eosinophils Absolute: 285 cells/uL (ref 15–500)
Eosinophils Relative: 3.7 %
HCT: 43.2 % (ref 38.5–50.0)
Hemoglobin: 14.6 g/dL (ref 13.2–17.1)
Lymphs Abs: 1740 cells/uL (ref 850–3900)
MCH: 32.2 pg (ref 27.0–33.0)
MCHC: 33.8 g/dL (ref 32.0–36.0)
MCV: 95.4 fL (ref 80.0–100.0)
MPV: 9.7 fL (ref 7.5–12.5)
Monocytes Relative: 8.6 %
Neutro Abs: 4990 cells/uL (ref 1500–7800)
Neutrophils Relative %: 64.8 %
Platelets: 188 10*3/uL (ref 140–400)
RBC: 4.53 10*6/uL (ref 4.20–5.80)
RDW: 13.8 % (ref 11.0–15.0)
Total Lymphocyte: 22.6 %
WBC: 7.7 10*3/uL (ref 3.8–10.8)

## 2020-10-16 LAB — LIPID PANEL
Cholesterol: 169 mg/dL (ref <200)
HDL: 38 mg/dL — ABNORMAL LOW (ref >=40)
Non-HDL Cholesterol (Calc): 131 mg/dL (calc) — ABNORMAL HIGH (ref <130)
Total CHOL/HDL Ratio: 4.4 (calc) (ref <5.0)
Triglycerides: 424 mg/dL — ABNORMAL HIGH (ref <150)

## 2020-10-16 LAB — HEMOGLOBIN A1C
Hgb A1c MFr Bld: 6.3 % of total Hgb — ABNORMAL HIGH (ref <5.7)
Mean Plasma Glucose: 134 mg/dL
eAG (mmol/L): 7.4 mmol/L

## 2020-10-16 LAB — TSH: TSH: 1.81 mIU/L (ref 0.40–4.50)

## 2020-10-21 DIAGNOSIS — S92001A Unspecified fracture of right calcaneus, initial encounter for closed fracture: Secondary | ICD-10-CM | POA: Diagnosis not present

## 2020-10-22 ENCOUNTER — Other Ambulatory Visit: Payer: Self-pay

## 2020-10-22 ENCOUNTER — Ambulatory Visit (INDEPENDENT_AMBULATORY_CARE_PROVIDER_SITE_OTHER): Payer: Medicare HMO | Admitting: Family Medicine

## 2020-10-22 ENCOUNTER — Encounter: Payer: Self-pay | Admitting: Family Medicine

## 2020-10-22 VITALS — BP 119/61 | HR 80 | Ht 68.5 in | Wt 179.0 lb

## 2020-10-22 DIAGNOSIS — Z0000 Encounter for general adult medical examination without abnormal findings: Secondary | ICD-10-CM

## 2020-10-22 DIAGNOSIS — E782 Mixed hyperlipidemia: Secondary | ICD-10-CM | POA: Diagnosis not present

## 2020-10-22 DIAGNOSIS — N401 Enlarged prostate with lower urinary tract symptoms: Secondary | ICD-10-CM

## 2020-10-22 DIAGNOSIS — R7303 Prediabetes: Secondary | ICD-10-CM

## 2020-10-22 DIAGNOSIS — N138 Other obstructive and reflux uropathy: Secondary | ICD-10-CM | POA: Diagnosis not present

## 2020-10-22 DIAGNOSIS — I10 Essential (primary) hypertension: Secondary | ICD-10-CM

## 2020-10-22 MED ORDER — LOSARTAN POTASSIUM 100 MG PO TABS: 100.0000 mg | ORAL_TABLET | Freq: Every day | ORAL | 3 refills | Status: DC

## 2020-10-22 NOTE — Assessment & Plan Note (Signed)
Still chronic elevated TG Controlled HDL, LDL, not on statin Last lipid 10/2020 The 10-year ASCVD risk score Kyle Bussing DC Jr., et al., 2013) is: 51.2%   Plan: 1. Discussion on ASCVD risk - he understands risks and prefers to avoid statin - CONTINUE Fish Oil Omega 3 1200 TID, and continue other OTC herbal supplement garlique 2. Continue ASA 81mg  for primary ASCVD risk reduction - discussed risk benefit - Also offered Statin vs Fibrate 3. Encourage improved lifestyle - low carb/cholesterol, reduce portion size, continue improving regular exercise

## 2020-10-22 NOTE — Progress Notes (Signed)
Subjective:    Patient ID: Kyle Kung., male    DOB: 03-18-1941, 80 y.o.   MRN: [id number]  Kyle Clayburn Rueda [name]. is a 80 y.o. male presenting on 10/22/2020 for Annual Exam   HPI  Here for Annual Physical and Lab Review.   Pre-Diabetes Lab shows A1c 6.3, prior 5 range. Some inactivity past 6-7 weeks due to foot injury CBGs: Avg 100-120, Low none, High none. Checks CBGs x 2-3 weeks Meds: Never Currently on ARB Lifestyle: - Diet (improved low carb) - Exercise (walks 30 min daily 5 x weekly, works in garden, yard, active at home) Denies hypoglycemia   CHRONIC HTN / Intermittent PVC - controlled. Followed [facility]iology Dr Fletcher Anon. Prior work up they checked Renal Artery [place] due to spikes or elevation in BP, results show normal without stenosis. Home BP improved Current Meds - Losartan 100mg , HCTZ 25mg , Metoprolol 37.5mg  twice daily (25mg  tabs, 1.5 per dose), Diltiazem-24 hour 240mg  Reports good compliance, took meds today. Tolerating well, w/o complaints. - Taking ASA 81mg  x 5 days weekly, skips weekends. No known CAD or CVA - Followed by Dr Fletcher Anon Cardiology for PVC in past, controlled on medicines   HYPERLIPIDEMIA: - Reports concerns with chronic elevated TG for reportedly >20 years and fam history of same. Last lipid panel 10/2020  still significantly elevated TG >400 - Currently taking Fish Oil Omega 3, 1200 - taking x 3 a day - he has not been on Statin or Fibrate in past by his report - he has been taking Garlique supplement OTC and also Cholestoff  Additional history He had accidental foot injury with fracture, used foot to push in a post in the ground. Now healed out of boot.  Occasionally wakes up overnight, some difficulty.   Following [facility] for squamous cell skin cancer   Health Maintenance:   BPH and Prostate CA Screening: Followed by Dr Ambrose [facility]. Prior PSA / DRE reported normal. Last PSA 1.9 approx 08/2020 from Dr Yves Dill.  Currently controlled BPH LUTS only mild. No known family history of prostate CA.    Last colonoscopy 02/08/17, Dr Rolly Salter GI, polyp x 1 tubular adenoma, negative, advised to consider repeat in 5 years. Otherwise may no longer need a repeat colonoscopy if age 2+. He declines to repeat unless there is  problem.   Depression screen [facility] 2/9 10/22/2020 09/02/2020 08/04/2020  Decreased Interest 0 0 0  Down, Depressed, Hopeless 0 0 0  PHQ - 2 Score 0 0 0  Altered sleeping 1 1 -  Tired, decreased energy 1 1 -  Change in appetite - 0 -  Feeling bad or failure about yourself  0 0 -  Trouble concentrating 0 0 -  Moving slowly or fidgety/restless 0 0 -  Suicidal thoughts 0 0 -  PHQ-9 Score 2 2 -  Difficult doing work/chores Not difficult at all Not difficult at all -    Past Medical History:  Diagnosis Date   Allergic rhinitis, cause unspecified    Diarrhea    Dysrhythmia    Esophageal reflux    H/O diverticulitis of colon    History of kidney stones    PVC's (premature ventricular contractions)    Right ventricular outflow tract   Past Surgical History:  Procedure Laterality Date   BASAL CELL CARCINOMA EXCISION     CATARACT EXTRACTION, BILATERAL  2020   COLONOSCOPY     COLONOSCOPY WITH PROPOFOL N/A 02/08/2017   Procedure:  COLONOSCOPY WITH PROPOFOL;  Surgeon: Manya Silvas, MD;  Location: [facility] ENDOSCOPY;  Service: Endoscopy;  Laterality: N/A;   REPAIR IMPERFORATE ANUS / ANORECTOPLASTY     TRANSURETHRAL RESECTION OF PROSTATE     Social History   Socioeconomic History   Marital status: Married    Spouse name: Not on file   Number of children: Not on file   Years of education: Not on file   Highest education level: Some college, no degree  Occupational History   Occupation: retired  Tobacco Use   Smoking status: Never   Smokeless tobacco: Never  Vaping Use   Vaping Use: Never used  Substance and Sexual Activity   Alcohol use: Yes    Comment: occasionally  2 glasses of  wine a month    Drug use: No   Sexual activity: Not on file  Other Topics Concern   Not on file  Social History Narrative   Not on file   Social Determinants of Health   Financial Resource Strain: Low Risk    Difficulty of Paying Living Expenses: Not hard at all  Food Insecurity: No Food Insecurity   Worried About Charity fundraiser in the Last Year: Never true   [place] in the Last Year: Never true  Transportation Needs: No Transportation Needs   Lack of Transportation (Medical): No   Lack of Transportation (Non-Medical): No  Physical Activity: Sufficiently Active   Days of Exercise per Week: 5 days   Minutes of Exercise per Session: 30 min  Stress: No Stress Concern Present   Feeling of Stress : Not at all  Social Connections: Not on file  Intimate Partner Violence: Not on file   Family History  Problem Relation Age of Onset   Hypertension Father    Current Outpatient Medications on File Prior to Visit  Medication Sig   aspirin 81 MG tablet Take 81 mg by mouth daily. Takes 5 days a week   azelastine (ASTELIN) 0.1 % nasal spray Place 1 spray into both nostrils 2 (two) times daily.    Blood Pressure Monitoring (BLOOD PRESSURE CUFF) MISC 1 Device by Does not apply route daily. Normal upper arm BP cuff   diltiazem (CARDIZEM CD) 240 MG 24 hr capsule Take 1 capsule (240 mg total) by mouth every morning.   Garlic 10 MG CAPS Take by mouth. Unsure of dose   glucose blood test strip ONETOUCH ULTRA BLUE (In Vitro Strip)  1 (one) Strip Strip check blood sugar once daily for 0 days  Quantity: 50;  Refills: 12   Ordered :02-Oct-2013  [name];  Started 03-Jul-2013 Active   glucose blood test strip Use as instructed   metoprolol tartrate (LOPRESSOR) 25 MG tablet Take 1.5 tablets (37.5 mg total) by mouth 2 (two) times daily.   Omega-3 Fatty Acids (OMEGA-3 FISH OIL) 1200 MG CAPS Take 1 capsule (1,200 mg total) by mouth 3 (three) times daily.   Sodium Chloride-Sodium Bicarb  1.57 g PACK Place into the nose as needed.    tamsulosin (FLOMAX) 0.4 MG CAPS capsule Take 0.4 mg by mouth daily.   hydrochlorothiazide (HYDRODIURIL) 25 MG tablet Take 1 tablet (25 mg total) by mouth daily.   No current facility-administered medications on file prior to visit.    Review of Systems  Constitutional:  Negative for activity change, appetite change, chills, diaphoresis, fatigue and fever.  HENT:  Negative for congestion and hearing loss.   Eyes:  Negative for visual disturbance.  Respiratory:  Negative for cough, chest tightness, shortness of breath and wheezing.   Cardiovascular:  Negative for chest pain, palpitations and leg swelling.  Gastrointestinal:  Negative for abdominal pain, constipation, diarrhea, nausea and vomiting.  Genitourinary:  Negative for dysuria, frequency and hematuria.  Musculoskeletal:  Negative for arthralgias and neck pain.  Skin:  Negative for rash.  Neurological:  Negative for dizziness, weakness, light-headedness, numbness and headaches.  Hematological:  Negative for adenopathy.  Psychiatric/Behavioral:  Negative for behavioral problems, dysphoric mood and sleep disturbance.   Per HPI unless specifically indicated above      Objective:    BP 119/61   Pulse 80   Ht 5' 8.5" (1.74 m)   Wt 179 lb (81.2 kg)   SpO2 96%   BMI 26.82 kg/m   Wt Readings from Last 3 Encounters:  10/22/20 179 lb (81.2 kg)  09/02/20 184 lb (83.5 kg)  08/04/20 180 lb (81.6 kg)    Physical Exam Vitals and nursing note reviewed.  Constitutional:      General: He is not in acute distress.    Appearance: He is well-developed. He is not diaphoretic.     Comments: Well-appearing, comfortable, cooperative  HENT:     Head: Normocephalic and atraumatic.  Eyes:     General:        Right eye: No discharge.        Left eye: No discharge.     Conjunctiva/sclera: Conjunctivae normal.     Pupils: Pupils are equal, round, and reactive to light.  Neck:     Thyroid: No  thyromegaly.  Cardiovascular:     Rate and Rhythm: Normal rate and regular rhythm.     Pulses: Normal pulses.     Heart sounds: Normal heart sounds. No murmur heard. Pulmonary:     Effort: Pulmonary effort is normal. No respiratory distress.     Breath sounds: Normal breath sounds. No wheezing or rales.  Abdominal:     General: Bowel sounds are normal. There is no distension.     Palpations: Abdomen is soft. There is no mass.     Tenderness: There is no abdominal tenderness.  Musculoskeletal:        General: No tenderness. Normal range of motion.     Cervical back: Normal range of motion and neck supple.     Comments: Upper / Lower Extremities: - Normal muscle tone, strength bilateral upper extremities 5/5, lower extremities 5/5  Lymphadenopathy:     Cervical: No cervical adenopathy.  Skin:    General: Skin is warm and dry.     Findings: No erythema or rash.  Neurological:     Mental Status: He is alert and oriented to person, place, and time.     Comments: Distal sensation intact to light touch all extremities  Psychiatric:        Mood and Affect: Mood normal.        Behavior: Behavior normal.        Thought Content: Thought content normal.     Comments: Well groomed, good eye contact, normal speech and thoughts      Results for orders placed or performed in visit on 10/15/20  TSH  Result Value Ref Range   TSH 1.81 0.40 - 4.50 mIU/L  Lipid panel  Result Value Ref Range   Cholesterol 169 <200 mg/dL   HDL 38 (L) > OR = 40 mg/dL   Triglycerides 424 (H) <150 mg/dL   LDL Cholesterol (Calc)  mg/dL (calc)  Total CHOL/HDL Ratio 4.4 <5.0 (calc)   Non-HDL Cholesterol (Calc) 131 (H) <130 mg/dL (calc)  COMPLETE METABOLIC PANEL WITH GFR  Result Value Ref Range   Glucose, Bld 112 (H) 65 - 99 mg/dL   BUN 17 7 - 25 mg/dL   Creat 1.12 0.70 - 1.18 mg/dL   GFR, Est Non African American 63 > OR = 60 mL/min/1.70m2   GFR, Est African American 73 > OR = 60 mL/min/1.62m2    BUN/Creatinine Ratio NOT APPLICABLE 6 - 22 (calc)   Sodium 141 135 - 146 mmol/L   Potassium 3.3 (L) 3.5 - 5.3 mmol/L   Chloride 103 98 - 110 mmol/L   CO2 27 20 - 32 mmol/L   Calcium 9.4 8.6 - 10.3 mg/dL   Total Protein 6.8 6.1 - 8.1 g/dL   Albumin 4.1 3.6 - 5.1 g/dL   Globulin 2.7 1.9 - 3.7 g/dL (calc)   AG Ratio 1.5 1.0 - 2.5 (calc)   Total Bilirubin 0.4 0.2 - 1.2 mg/dL   Alkaline phosphatase (APISO) 104 35 - 144 U/L   AST 24 10 - 35 U/L   ALT 33 9 - 46 U/L  CBC with Differential/Platelet  Result Value Ref Range   WBC 7.7 3.8 - 10.8 Thousand/uL   RBC 4.53 4.20 - 5.80 Million/uL   Hemoglobin 14.6 13.2 - 17.1 g/dL   HCT 43.2 38.5 - 50.0 %   MCV 95.4 80.0 - 100.0 fL   MCH 32.2 27.0 - 33.0 pg   MCHC 33.8 32.0 - 36.0 g/dL   RDW 13.8 11.0 - 15.0 %   Platelets 188 140 - 400 Thousand/uL   MPV 9.7 7.5 - 12.5 fL   Neutro Abs 4,990 1,500 - 7,800 cells/uL   Lymphs Abs 1,740 850 - 3,900 cells/uL   Absolute Monocytes 662 200 - 950 cells/uL   Eosinophils Absolute 285 15 - 500 cells/uL   Basophils Absolute 23 0 - 200 cells/uL   Neutrophils Relative % 64.8 %   Total Lymphocyte 22.6 %   Monocytes Relative 8.6 %   Eosinophils Relative 3.7 %   Basophils Relative 0.3 %  Hemoglobin A1c  Result Value Ref Range   Hgb A1c MFr Bld 6.3 (H) <5.7 % of total Hgb   Mean Plasma Glucose 134 mg/dL   eAG (mmol/L) 7.4 mmol/L      Assessment & Plan:   Problem List Items Addressed This Visit     Pre-diabetes    Elevated A1c up to 6.3, sedentary recently, some poor diet Concern with HTN, HLD  Plan:  1. Not on any therapy currently - again defer new med today - since improved 2. Encourage improved lifestyle - low carb, low sugar diet, reduce portion size, continue improving regular exercise       Hyperlipidemia    Still chronic elevated TG Controlled HDL, LDL, not on statin Last lipid 10/2020 The 10-year ASCVD risk score Mikey Bussing DC Jr., et al., 2013) is: 51.2%   Plan: 1. Discussion on ASCVD  risk - he understands risks and prefers to avoid statin - CONTINUE Fish Oil Omega 3 1200 TID, and continue other OTC herbal supplement garlique 2. Continue ASA 81mg  for primary ASCVD risk reduction - discussed risk benefit - Also offered Statin vs Fibrate 3. Encourage improved lifestyle - low carb/cholesterol, reduce portion size, continue improving regular exercise       Relevant Medications   losartan (COZAAR) 100 MG tablet   Essential hypertension    Controlled BP Home readings  reviewed No known complications except history of PVCs Followed by Cardiology   Plan:  1. Continue current BP regimen - Losartan 100mg , HCTZ 25mg , Metoprolol 37.5mg  BID (1.5 tabs of 25mg ), Diltiazem 240mg  2. Encourage improved lifestyle - low sodium diet, continue regular exercise 3. Continue monitor BP outside office, bring readings to next visit, if persistently >140/90 or new symptoms notify office sooner       Relevant Medications   losartan (COZAAR) 100 MG tablet   BPH with obstruction/lower urinary tract symptoms    Stable without change in LUTS Not on therapy rx Followed by [place] u[facility] Dr Maryan Puls - Will follow-up as needed, defer future PSA if he checks at urology       Other Visit Diagnoses     Annual physical exam    -  Primary       Updated Health Maintenance information Reviewed recent lab results with patient Encouraged improvement to lifestyle with diet and exercise Goal of weight loss   Meds ordered this encounter  Medications   losartan (COZAAR) 100 MG tablet    Sig: Take 1 tablet (100 mg total) by mouth daily.    Dispense:  90 tablet    Refill:  3      Follow up plan: Return in about 6 months (around 04/23/2021) for 6 month follow-up PreDM A1c, HTN, HLD.  Nobie Putnam, [facility] 10/22/2020, 8:42 AM

## 2020-10-22 NOTE — Assessment & Plan Note (Signed)
Elevated A1c up to 6.3, sedentary recently, some poor diet Concern with HTN, HLD  Plan:  1. Not on any therapy currently - again defer new med today - since improved 2. Encourage improved lifestyle - low carb, low sugar diet, reduce portion size, continue improving regular exercise

## 2020-10-22 NOTE — Assessment & Plan Note (Signed)
Controlled BP Home readings reviewed No known complications except history of PVCs Followed by Cardiology   Plan:  1. Continue current BP regimen - Losartan '100mg'$ , HCTZ '25mg'$ , Metoprolol 37.'5mg'$  BID (1.5 tabs of '25mg'$ ), Diltiazem '240mg'$  2. Encourage improved lifestyle - low sodium diet, continue regular exercise 3. Continue monitor BP outside office, bring readings to next visit, if persistently >140/90 or new symptoms notify office sooner

## 2020-10-22 NOTE — Patient Instructions (Addendum)
Thank you for coming to the office today.  Next chance you get if we can get a copy of the last COVID Booster (2nd dose) we need the date and the LOT # to update our records.  I am okay if you continue or stop Aspirin in future. Risk is for bleeding.  Future consider Statin rx medication low dose to help reduce cardiovascular problems.  Mild low potassium,, try to consume some extra veggies / dark greens etc.  Recent Labs    10/15/20 0759  HGBA1C 6.3*    Goal to limit carbs starches and repeat in 6 months  Sleep Hygiene Recommendations to promote healthy sleep in all patients, especially if symptoms of insomnia are worsening. Due to the nature of sleep rhythms, if your body gets "out of rhythm", it may take some time before your sleep cycle can be "reset".  Please try to follow as many of the following tips as you can, usually there are only a few of these are the primary cause of the problem.  ?To reset your sleep rhythm, go to bed and get up at the same time every day ?Sleep only long enough to feel rested and then get out of bed ?Do not try to force yourself to sleep. If you can't sleep, get out of bed and try again later. ?Avoid naps during the day, unless excessively tired. The more sleeping during the day, then the less sleep your body needs at night.  ?Have coffee, tea, and other foods that have caffeine only in the morning ?Exercise several days a week, but not right before bed ?If you drink alcohol, prefer to have appropriate drink with one meal, but prefer to avoid alcohol in the evening, and bedtime ?If you smoke, avoid smoking, especially in the evening  ?Avoid watching TV or looking at phones, computers, or reading devices ("e-books") that give off light at least 30 minutes before bed. This artificial light sends "awake signals" to your brain and can make it harder to fall asleep. ?Make your bedroom a comfortable place where it is easy to fall asleep: Put up shades or  special blackout curtains to block light from outside. Use a white noise machine to block noise. Keep the temperature cool. ?Try your best to solve or at least address your problems before you go to bed ?Use relaxation techniques to manage stress. Ask your health care provider to suggest some techniques that may work well for you. These may include: Breathing exercises. Routines to release muscle tension. Visualizing peaceful scenes.    Please schedule a Follow-up Appointment to: Return in about 6 months (around 04/23/2021) for 6 month follow-up PreDM A1c, HTN, HLD.  If you have any other questions or concerns, please feel free to call the office or send a message through [place]. You may also schedule an earlier appointment if necessary.  Additionally, you may be receiving a survey about your experience at our office within a few days to 1 week by e-mail or mail. We value your feedback.  Nobie Putnam, DO [place]

## 2020-10-22 NOTE — Assessment & Plan Note (Signed)
Stable without change in LUTS Not on therapy rx Followed by [facility] Dr Maryan Puls - Will follow-up as needed, defer future PSA if he checks at urology

## 2020-11-07 ENCOUNTER — Other Ambulatory Visit: Payer: Self-pay | Admitting: Cardiovascular Disease

## 2020-11-10 ENCOUNTER — Encounter: Payer: Self-pay | Admitting: Family Medicine

## 2020-11-10 ENCOUNTER — Other Ambulatory Visit: Payer: Self-pay

## 2020-11-10 ENCOUNTER — Ambulatory Visit (INDEPENDENT_AMBULATORY_CARE_PROVIDER_SITE_OTHER): Payer: Medicare HMO | Admitting: Family Medicine

## 2020-11-10 ENCOUNTER — Telehealth: Payer: Self-pay | Admitting: General Practice

## 2020-11-10 VITALS — BP 129/71 | HR 64 | Ht 68.5 in | Wt 179.4 lb

## 2020-11-10 DIAGNOSIS — G5702 Lesion of sciatic nerve, left lower limb: Secondary | ICD-10-CM

## 2020-11-10 DIAGNOSIS — M5432 Sciatica, left side: Secondary | ICD-10-CM | POA: Diagnosis not present

## 2020-11-10 MED ORDER — PREDNISONE 10 MG PO TABS: ORAL_TABLET | ORAL | 0 refills | Status: DC

## 2020-11-10 MED ORDER — NAPROXEN 500 MG PO TABS: 500.0000 mg | ORAL_TABLET | Freq: Two times a day (BID) | ORAL | 2 refills | Status: DC
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
## 2020-11-10 NOTE — Telephone Encounter (Signed)
  Care Management   Follow Up Note   11/10/2020 Name: Kyle Vargas [employer]. MRN: [id number] DOB: Nov 28, 1940   Referred by: [name], DO Reason for referral : Chronic Care Management (RNCM: The patient left a VM and ask for a call back from the office to get an appointment with pcp)   The patient called early am and left a voicemail asking for the office to call him back so he could get an appointment to come in and see the pcp due to hip and back pain. Secure message sent to office staff asking for them to reach out to help the patient with scheduling an appointment with Dr. Parks Ranger.   Follow Up Plan: The care management team will reach out to the patient again over the next 30 to 60 days.   [name], MSN, [facility] Mobile: [phone]

## 2020-11-10 NOTE — Patient Instructions (Addendum)
Thank you for coming to the office today.  For your Back/Hip Pain - this is most likely due to Muscle Spasms in your Piriformis Muscle (deeper in by hip), causing Sciatica (irritation of your sciatic nerve can cause shooting pain down your legs, sometimes with numbness and tingling).  - Start Prednisone taper (steroid anti-inflammatory) for nerve irritation with pain in legs. Each pill is 10mg . Take 6 pills (60mg  daily) for 1 day at same time with breakfast, then each day reduce dose by 1 pill, so 5 pills, then 4, then 3, then 2 then 1 (last 6 days). Do not take any Ibuprofen or Aleve while taking the Prednisone. - Once finished Prednisone, then start with other anti-inflammatory Aleve OTC / Naproxen 500mg  one pill with food every 12 hours (or 2 times a day) for 1-2 weeks AFTER prednisone   - Also safe to add on Tylenol Extra Strength 500mg  tabs - take 1 to 2 tabs (max 1000mg  per dose) every 6 hours for pain (take regularly, don't skip a dose for next 3-7 days), max 24 hour daily dose is 6 to 8 tablets or 3000 to 4000mg   Recommend to start using heating pad on your lower back 1-2x daily for few weeks  Be careful with prolonged sitting (especially if you sit on a wallet or other object this can cause flare of pain), if driving long car trip make sure to stop and take a good break to stretch legs, may find a cushion that works to ease stress of your buttocks and hip muscles.  Also try a Wedge Seat Cushion to avoid nerve pinching when sitting prolonged period of time.  This pain may take weeks to months to fully resolve, but hopefully it will respond to the medicine initially. All back injuries (small or serious) are slow to heal since we use our back muscles every day. Be careful with turning, twisting, lifting, sitting / standing for prolonged periods, and avoid re-injury.  If your symptoms significantly worsen with more pain, or new symptoms with weakness in one or both legs, new or different  shooting leg pains, numbness in legs or groin, loss of control or retention of urine or bowel movements, please call back for advice and you may need to go directly to the Emergency Department.   Please schedule a Follow-up Appointment to: Return in about 4 weeks (around 12/08/2020), or if symptoms worsen or fail to improve.  If you have any other questions or concerns, please feel free to call the office or send a message through [place]. You may also schedule an earlier appointment if necessary.  Additionally, you may be receiving a survey about your experience at our office within a few days to 1 week by e-mail or mail. We value your feedback.  Nobie Putnam, DO [facility], [facility]     Piriformis Syndrome Rehabilitation Exercises   You may do all of these exercises right away once acute pain starts to improve on medication or treatment.  Piriformis stretch: Lying on your back with both knees bent, rest the ankle of your injured leg over the knee of your uninjured leg. Grasp the thigh of your uninjured leg and pull that knee toward your chest. You will feel a stretch along the buttocks and possibly along the outside of your hip on the injured side. Hold this for 15 to 30 seconds. Repeat 3 times.  Standing hamstring stretch: Place the heel of your leg on a stool about 15 inches high.  Keep your knee straight. Lean forward, bending at the hips until you feel a mild stretch in the back of your thigh. Make sure you do not roll your shoulders and bend at the waist when doing this or you will stretch your lower back instead. Hold the stretch for 15 to 30 seconds. Repeat 3 times.  Pelvic tilt: Lie on your back with your knees bent and your feet flat on the floor. Tighten your abdominal muscles and push your lower back into the floor. Hold this position for 5 seconds, then relax. Do 3 sets of 10.  Partial curl: Lie on your back with your knees bent and your feet flat on the  floor. Tighten your stomach muscles and flatten your back against the floor. Tuck your chin to your chest. With your hands stretched out in front of you, curl your upper body forward until your shoulders clear the floor. Hold this position for 3 seconds. Don't hold your breath. It helps to breathe out as you lift your shoulders up. Relax. Repeat 10 times. Build to 3 sets of 10. To challenge yourself, clasp your hands behind your head and keep your elbows out to the side.  Prone hip extension: Lie on your stomach with your legs straight out behind you. Tighten up your buttocks muscles and lift one leg off the floor about 8 inches. Keep your knee straight. Hold for 5 seconds. Then lower your leg and relax. Do 3 sets of 10.  If both legs are affected, you may repeat this exercise for the other leg.

## 2020-11-14 ENCOUNTER — Other Ambulatory Visit: Payer: Self-pay | Admitting: Cardiovascular Disease

## 2020-11-27 ENCOUNTER — Telehealth: Payer: Self-pay | Admitting: *Deleted

## 2020-11-27 NOTE — Chronic Care Management (AMB) (Signed)
  Care Management   Note  11/27/2020 Name: Kyle Vargas [employer]. MRN: [id number] DOB: 04/26/1941  Kyle Vargas. is a 80 y.o. year old male who is a primary care patient of [name], DO and is actively engaged with the care management team. I reached out to [employer]. by phone today to assist with re-scheduling a follow up visit with the RN Case Manager  Follow up plan: A telephone outreach attempt made. The care management team will reach out to the patient again over the next 7 days. If patient returns call to provider office, please advise to call [place] at [place] Management  Direct Dial: [phone]

## 2020-11-30 ENCOUNTER — Other Ambulatory Visit: Payer: Self-pay | Admitting: Cardiovascular Disease

## 2020-11-30 DIAGNOSIS — M9903 Segmental and somatic dysfunction of lumbar region: Secondary | ICD-10-CM | POA: Diagnosis not present

## 2020-11-30 DIAGNOSIS — M5432 Sciatica, left side: Secondary | ICD-10-CM | POA: Diagnosis not present

## 2020-12-02 DIAGNOSIS — M5432 Sciatica, left side: Secondary | ICD-10-CM | POA: Diagnosis not present

## 2020-12-02 DIAGNOSIS — M9903 Segmental and somatic dysfunction of lumbar region: Secondary | ICD-10-CM | POA: Diagnosis not present

## 2020-12-04 DIAGNOSIS — M9903 Segmental and somatic dysfunction of lumbar region: Secondary | ICD-10-CM | POA: Diagnosis not present

## 2020-12-04 DIAGNOSIS — M5432 Sciatica, left side: Secondary | ICD-10-CM | POA: Diagnosis not present

## 2020-12-07 ENCOUNTER — Telehealth: Payer: Medicare HMO

## 2020-12-07 DIAGNOSIS — M5432 Sciatica, left side: Secondary | ICD-10-CM | POA: Diagnosis not present

## 2020-12-07 DIAGNOSIS — M9903 Segmental and somatic dysfunction of lumbar region: Secondary | ICD-10-CM | POA: Diagnosis not present

## 2020-12-07 NOTE — Chronic Care Management (AMB) (Signed)
  Care Management   Note  12/07/2020 Name: Kyle Vargas [employer]. MRN: [id number] DOB: [birth year]  Kyle Vargas. is a 80 y.o. year old male who is a primary care patient of [name], DO and is actively engaged with the care management team. I reached out to [name] by phone today to assist with re-scheduling a follow up visit with the RN Case Manager  Follow up plan: Telephone appointment with care management team member scheduled for:12/21/20  Leigha Olberding  Care Guide, Embedded Care Coordination [place]  Care Management  Direct Dial: [phone]

## 2020-12-09 DIAGNOSIS — M9903 Segmental and somatic dysfunction of lumbar region: Secondary | ICD-10-CM | POA: Diagnosis not present

## 2020-12-09 DIAGNOSIS — M5432 Sciatica, left side: Secondary | ICD-10-CM | POA: Diagnosis not present

## 2020-12-11 DIAGNOSIS — M5432 Sciatica, left side: Secondary | ICD-10-CM | POA: Diagnosis not present

## 2020-12-11 DIAGNOSIS — M9903 Segmental and somatic dysfunction of lumbar region: Secondary | ICD-10-CM | POA: Diagnosis not present

## 2020-12-14 DIAGNOSIS — M5432 Sciatica, left side: Secondary | ICD-10-CM | POA: Diagnosis not present

## 2020-12-14 DIAGNOSIS — M9903 Segmental and somatic dysfunction of lumbar region: Secondary | ICD-10-CM | POA: Diagnosis not present

## 2020-12-16 DIAGNOSIS — M5432 Sciatica, left side: Secondary | ICD-10-CM | POA: Diagnosis not present

## 2020-12-16 DIAGNOSIS — M9903 Segmental and somatic dysfunction of lumbar region: Secondary | ICD-10-CM | POA: Diagnosis not present

## 2020-12-21 ENCOUNTER — Telehealth: Payer: Medicare HMO | Admitting: General Practice

## 2020-12-21 ENCOUNTER — Ambulatory Visit (INDEPENDENT_AMBULATORY_CARE_PROVIDER_SITE_OTHER): Payer: Medicare HMO | Admitting: General Practice

## 2020-12-21 DIAGNOSIS — I10 Essential (primary) hypertension: Secondary | ICD-10-CM | POA: Diagnosis not present

## 2020-12-21 DIAGNOSIS — E782 Mixed hyperlipidemia: Secondary | ICD-10-CM | POA: Diagnosis not present

## 2020-12-21 DIAGNOSIS — M5432 Sciatica, left side: Secondary | ICD-10-CM

## 2020-12-21 DIAGNOSIS — G5702 Lesion of sciatic nerve, left lower limb: Secondary | ICD-10-CM

## 2020-12-21 NOTE — Chronic Care Management (AMB) (Signed)
Chronic Care Management   CCM RN Visit Note  12/21/2020 Name: Keondre Markson [employer]. MRN: [id number] DOB: 1940-08-27  Subjective: Alvina Filbert Sapien [name]. is a 80 y.o. year old male who is a primary care patient of [name], DO. The care management team was consulted for assistance with disease management and care coordination needs.    Engaged with patient by telephone for follow up visit in response to provider referral for case management and/or care coordination services.   Consent to Services:  The patient was given information about Chronic Care Management services, agreed to services, and gave verbal consent prior to initiation of services.  Please see initial visit note for detailed documentation.   Patient agreed to services and verbal consent obtained.   Assessment: Review of patient past medical history, allergies, medications, health status, including review of consultants reports, laboratory and other test data, was performed as part of comprehensive evaluation and provision of chronic care management services.   SDOH (Social Determinants of Health) assessments and interventions performed:    CCM Care Plan  Allergies  Allergen Reactions   Dust Mite Extract     eye itching   Other Other (See Comments)    Dogs and cats, itching eyes, sneezing     Outpatient Encounter Medications as of 12/21/2020  Medication Sig Note   aspirin 81 MG tablet Take 81 mg by mouth daily. Takes 5 days a week    azelastine (ASTELIN) 0.1 % nasal spray Place 1 spray into both nostrils 2 (two) times daily.     Blood Pressure Monitoring (BLOOD PRESSURE CUFF) MISC 1 Device by Does not apply route daily. Normal upper arm BP cuff    diltiazem (CARDIZEM CD) 240 MG 24 hr capsule Take 1 capsule (240 mg total) by mouth every morning.    Garlic 10 MG CAPS Take by mouth. Unsure of dose    glucose blood test strip ONETOUCH ULTRA BLUE (In Vitro Strip)  1 (one) Strip Strip check blood sugar once daily  for 0 days  Quantity: 50;  Refills: 12   Ordered :02-Oct-2013  [name];  Started 03-Jul-2013 Active 08/09/2014: Received from: [facility]   glucose blood test strip Use as instructed    hydrochlorothiazide (HYDRODIURIL) 25 MG tablet Take 1 tablet (25 mg total) by mouth daily.    losartan (COZAAR) 100 MG tablet Take 1 tablet (100 mg total) by mouth daily.    metoprolol tartrate (LOPRESSOR) 25 MG tablet Take 1.5 tablets (37.5 mg total) by mouth 2 (two) times daily.    naproxen (NAPROSYN) 500 MG tablet Take 1 tablet (500 mg total) by mouth 2 (two) times daily with a meal. For 1-2 weeks then as needed    Omega-3 Fatty Acids (OMEGA-3 FISH OIL) 1200 MG CAPS Take 1 capsule (1,200 mg total) by mouth 3 (three) times daily. 12/25/2018: 3614m daily    predniSONE (DELTASONE) 10 MG tablet Take 6 tabs with breakfast Day 1, 5 tabs Day 2, 4 tabs Day 3, 3 tabs Day 4, 2 tabs Day 5, 1 tab Day 6.    Sodium Chloride-Sodium Bicarb 1.57 g PACK Place into the nose as needed.  12/25/2018: As needed    tamsulosin (FLOMAX) 0.4 MG CAPS capsule Take 0.4 mg by mouth daily.    No facility-administered encounter medications on file as of 12/21/2020.    Patient Active Problem List   Diagnosis Date Noted   Hyperlipidemia 02/15/2017   BPH with obstruction/lower urinary tract symptoms 11/07/2016  History of nonmelanoma skin cancer 11/07/2016   Pre-diabetes 10/26/2015   PVC's (premature ventricular contractions)    Essential hypertension     Conditions to be addressed/monitored:HTN, HLD, and Piriformis syndrome  with left side sciatic nerve pain  Care Plan : RNCM: Management of Hypertension  Updates made by Vanita Ingles since 12/21/2020 12:00 AM     Problem: RNCM: Hypertension   Priority: Medium     Long-Range Goal: RNCM: Management of HTN   Start Date: 07/20/2020  Expected End Date: 07/20/2021  This Visit's Progress: On track  Priority: Medium  Note:   Objective:  Last practice recorded  BP readings:  BP Readings from Last 3 Encounters:  11/10/20 129/71  10/22/20 119/61  09/02/20 (!) 154/60     Most recent eGFR/CrCl: No results found for: EGFR  No components found for: CRCL Current Barriers:  Knowledge Deficits related to basic understanding of hypertension  pathophysiology and self care management Unable to independently manage HTN  Case Manager Clinical Goal(s):   patient will verbalize understanding of plan for hypertension management  patient will attend all scheduled medical appointments: 10-22-2020 with the pcp patient will demonstrate improved adherence to prescribed treatment plan for hypertension as evidenced by taking all medications as prescribed, monitoring and recording blood pressure as directed, adhering to low sodium/DASH diet  patient will demonstrate improved health management independence as evidenced by checking blood pressure as directed and notifying PCP if SBP>160 or DBP > 90, taking all medications as prescribe, and adhering to a low sodium diet as discussed. Interventions:  Evaluation of current treatment plan related to hypertension self management and patient's adherence to plan as established by provider. 07-20-2020: The patient is doing well states that after seeing pcp on 05-27-2020 by video visit his wife tested positive for COVID and then he tested positive for COVID. He finished the course of antibiotics and also other recommendations and said sx/sx lasted about 5 days. He denies any complications at this time. Will continue to monitor. 12-21-2020: States he is doing well and has no new concerns related to HTN and cardiac health. The patient states that his pain is better from his left sciatic nerve and he is eating well and staying in out of the heat. Denies any acute distress. Is not sleeping as well and contributes that to how late he eats sometimes. Will continue to monitor for changes.  Provided education to patient re: stroke prevention, s/s of  heart attack and stroke, DASH diet, complications of uncontrolled blood pressure. 12-21-2020: Review and education  Reviewed medications with patient and discussed importance of compliance.  The patient states he is doing well with managing his medications. 12-21-2020: The patient is eating well and denies any new concerns with his dietary intake. Is enjoying fresh vegetables from his garden. States he has gained weight and is sometimes eating too late, but denies any issues related to dietary restrictions.  Discussed plans with patient for ongoing care management follow up and provided patient with direct contact information for care management team Advised patient, providing education and rationale, to monitor blood pressure daily and record, calling PCP for findings outside established parameters. 07-20-2020: The patient continue to take his blood pressure at home. States last week he had an episode of it being low at 118/61. States he noticed a little light headedness and dizziness but it resolved.  Review of orthostatic hypotension. 12-21-2020: States he is having normal blood pressure readings at home. The patient states that he feels fine.  Reviewed scheduled/upcoming provider appointments including: 04-23-2021 at 0840 am - blood pressure trends reviewed - depression screen reviewed - home or ambulatory blood pressure monitoring encouraged Patient Goals/Self-Care Activities Over the next 120 days, patient will:  - Calls provider office for new concerns, questions, or BP outside discussed parameters Checks BP and records as discussed Follows a low sodium diet/DASH diet Follow Up Plan: Telephone follow up appointment with care management team member scheduled for:  02-22-2021 at 0945 am    Task: RNCM: Identify and Monitor Blood Pressure Elevation Completed 12/21/2020  Outcome: Positive  Note:   Care Management Activities:    - blood pressure trends reviewed - depression screen reviewed - home  or ambulatory blood pressure monitoring encouraged        Care Plan : RNCM: HLD Management  Updates made by Vanita Ingles since 12/21/2020 12:00 AM     Problem: RNCM: HLD Management   Priority: Medium     Long-Range Goal: RNCM: HLD Management   Start Date: 07/20/2020  Expected End Date: 12/16/2021  This Visit's Progress: On track  Priority: Medium  Note:   Current Barriers:  Poorly controlled hyperlipidemia, complicated by HTN and recent COVID infection  Current antihyperlipidemic regimen: Omega 3 1200 mg  Most recent lipid panel:     Component Value Date/Time   CHOL 169 10/15/2020 0759   TRIG 424 (H) 10/15/2020 0759   HDL 38 (L) 10/15/2020 0759   CHOLHDL 4.4 10/15/2020 0759   VLDL 45 (H) 07/06/2016 0001   LDLCALC  10/15/2020 0759     Comment:     . LDL cholesterol not calculated. Triglyceride levels greater than 400 mg/dL invalidate calculated LDL results. . Reference range: <100 . Desirable range <100 mg/dL for primary prevention;   <70 mg/dL for patients with CHD or diabetic patients  with > or = 2 CHD risk factors. Marland Kitchen LDL-C is now calculated using the Martin-Hopkins  calculation, which is a validated novel method providing  better accuracy than the Friedewald equation in the  estimation of LDL-C.  Cresenciano Genre et al. Annamaria Helling. 9935;701(77): 2061-2068  (http://education.[employer].com/faq/FAQ164)    ASCVD risk enhancing conditions: age >71, pre-DM, HTN Lacks social connections Does not contact provider office for questions/concerns RN Care Manager Clinical Goal(s):  patient will work with Consulting civil engineer, providers, and care team towards execution of optimized self-health management plan patient will verbalize understanding of plan for effective management of HLD  patient will work with [facility] and pcp  to address needs related to effective management of HLD  patient will attend all scheduled medical appointments: 04-23-2021 at 0840  am Interventions: Collaboration with [name], DO regarding development and update of comprehensive plan of care as evidenced by provider attestation and co-signature Inter-disciplinary care team collaboration (see longitudinal plan of care) Medication review performed; medication list updated in electronic medical record. 12-21-2020: The patient is compliant with his medications  Inter-disciplinary care team collaboration (see longitudinal plan of care) Referred to pharmacy team for assistance with HLD medication management Evaluation of current treatment plan related to HLD and patient's adherence to plan as established by provider. 12-21-2020: The patient is compliant with the plan of care for HLD. Compliant with heart healthy diet and medications.  Advised patient to call the office for concerns or questions  Provided education to patient re: heart healthy diet, taking medications as directed, and working with the CCM team to manage health and well being  Reviewed medications with patient and discussed compliance  Reviewed  scheduled/upcoming provider appointments including: 04-23-2021 at 0840 am Discussed plans with patient for ongoing care management follow up and provided patient with direct contact information for care management team Patient Goals/Self-Care Activities: - call for medicine refill 2 or 3 days before it runs out - call if I am sick and can't take my medicine - keep a list of all the medicines I take; vitamins and herbals too - learn to read medicine labels - use a pillbox to sort medicine - use an alarm clock or phone to remind me to take my medicine - change to whole grain breads, cereal, pasta - drink 6 to 8 glasses of water each day - eat 3 to 5 servings of fruits and vegetables each day - eat 5 or 6 small meals each day - eat fish at least once per week - fill half the plate with nonstarchy vegetables - limit fast food meals to no more than 1 per  week - manage portion size - prepare main meal at home 3 to 5 days each week - read food labels for fat, fiber, carbohydrates and portion size - reduce red meat to 2 to 3 times a week - set a realistic goal - be open to making changes - I can manage, know and watch for signs of a heart attack - if I have chest pain, call for help - learn about small changes that will make a big difference - learn my personal risk factors - barriers to meeting goals identified - change-talk evoked - choices provided - collaboration with team encouraged - decision-making supported - difficulty of making life-long changes acknowledged - health risks reviewed - problem-solving facilitated - questions answered - readiness for change evaluated - reassurance provided - self-reflection promoted - self-reliance encouraged  Follow Up Plan: Telephone follow up appointment with care management team member scheduled for: 02-22-2021 at 0945 am      Task: RNCM: HLD Management Completed 12/21/2020  Outcome: Positive  Note:   Care Management Activities:    - barriers to meeting goals identified - change-talk evoked - choices provided - collaboration with team encouraged - decision-making supported - difficulty of making life-long changes acknowledged - health risks reviewed - problem-solving facilitated - questions answered - readiness for change evaluated - reassurance provided - self-reflection promoted - self-reliance encouraged        Care Plan : RNCM: Chronic Pain (Adult)  Updates made by Vanita Ingles since 12/21/2020 12:00 AM     Problem: RNCM: Chronic Pain Management (Chronic Pain)   Priority: High     Goal: RNCM: Chronic Pain Managed   Start Date: 11/10/2020  Expected End Date: 04/27/2021  This Visit's Progress: On track  Priority: High  Note:   Current Barriers:  Knowledge Deficits related to managing acute/chronic pain Non-adherence to scheduled provider  appointments Non-adherence to prescribed medication regimen Difficulty obtaining medications Chronic Disease Management support and education needs related to chronic pain Unable to independently manage piriformis syndrome and left side sciatic nerve pain Nurse Case Manager Clinical Goal(s):  patient will verbalize understanding of plan for managing pain patient will work with RN Care Manager to address pain needs, questions or concerns patient will attend all scheduled medical appointments: 04-23-2021 at [place] am patient will demonstrate use of different relaxation  skills and/or diversional activities to assist with pain reduction (distraction, imagery, relaxation, massage, acupressure, TENS, heat, and cold application patient will report pain at a level less than 3 to 4 on a 10-10 rating  scale patient will use pharmacological and nonpharmacological pain relief strategies patient will verbalize acceptable level of pain relief and ability to engage in desired activities patient will engage in desired activities without an increase in pain level Interventions:  Collaboration with Parks Ranger, Devonne Doughty, DO regarding development and update of comprehensive plan of care as evidenced by provider attestation and co-signature Inter-disciplinary care team collaboration (see longitudinal plan of care) - deep breathing, relaxation and mindfulness use promoted - effectiveness of pharmacologic therapy monitored - medication-induced side effects managed - misuse of pain medication assessed - motivation and barriers to change assessed and addressed - mutually acceptable comfort goal set - pain assessed - pain treatment goals reviewed - premedication prior to activity encouraged Evaluation of current treatment plan related to piriformis syndrome and left side sciatic nerve pain and patient's adherence to plan as established by provider. The patient states he is feeling much better and back to his  regular routine. He denies any acute distress and says that he went to the chiropractor in addition to seeing pcp for 8 visits. He states he was told her was out of alignment and he is feeling much better now. Denies any acute pain today on outreach call. Will continue to monitor.  Advised patient to call the office for changes in pain level or intensity of pain  Provided education to patient re: monitoring for changes in pain, working with the CCM team to help with pain relief measures, keep appointments, follow recommendations of pcp and specialists Reviewed medications with patient and discussed compliance, the patient states the prednisone and medications by pcp were very helpful and treatments by chiropractor very helpful.  Discussed plans with patient for ongoing care management follow up and provided patient with direct contact information for care management team Allow patient to maintain a diary of pain ratings, timing, precipitating events, medications, treatments, and what works best to relieve pain,  Refer to support groups and self-help groups Educate patient about the use of pharmacological interventions for pain management- antianxiety, antidepressants, NSAIDS, opioid analgesics,  Explain the importance of lifestyle modifications to effective pain management  Patient Goals/Self Care Activities:   Self-administers medications as prescribed Attends all scheduled provider appointments Calls pharmacy for medication refills Calls provider office for new concerns or questions Follow Up Plan: Telephone follow up appointment with care management team member scheduled for: 02-22-2021 at 0945 am      Task: Alleviate Barriers to Chronic Pain Management Completed 12/21/2020  Outcome: Positive  Note:   Care Management Activities:    - careful application of heat or ice encouraged - complementary therapy use encouraged - deep breathing, relaxation and mindfulness use promoted -  effectiveness of pharmacologic therapy monitored - medication-induced side effects managed - misuse of pain medication assessed - motivation and barriers to change assessed and addressed - mutually acceptable comfort goal set - pain assessed - pain treatment goals reviewed - premedication prior to activity encouraged         Plan:Telephone follow up appointment with care management team member scheduled for:  02-22-2021 at [place] am  [name], MSN, [place] Mobile: [phone]

## 2020-12-21 NOTE — Patient Instructions (Signed)
Visit Information  PATIENT GOALS:  Goals Addressed             This Visit's Progress    RNCM: Manage Chronic Pain       Timeframe:  Long-Range Goal Priority:  Medium Start Date:       12-21-2020                      Expected End Date:  12-21-2021                     Follow Up Date 02/22/2021    - call for medicine refill 2 or 3 days before it runs out - develop a personal pain management plan - keep track of prescription refills - plan exercise or activity when pain is best controlled - prioritize tasks for the day - track times pain is worst and when it is best - track what makes the pain worse and what makes it better - use ice or heat for pain relief - work slower and less intense when having pain    Why is this important?   Day-to-day life can be hard when you have chronic pain.  Pain medicine is just one piece of the treatment puzzle.  You can try these action steps to help you manage your pain.    12-21-2020: The patient came to see the pcp in July for piriformis syndrome, left sided sciatic nerve pain. Was on prednisone and saw chiropractor 8 times.  States he was out of alignment and he is doing much better now. Denies any acute distress today. Will continue to monitor.         Patient verbalizes understanding of instructions provided today and agrees to view in [place].   Telephone follow up appointment with care management team member scheduled for:02-22-2021 at [place] am  [place], MSN, [place] Mobile: [phone]

## 2021-01-13 DIAGNOSIS — M9903 Segmental and somatic dysfunction of lumbar region: Secondary | ICD-10-CM | POA: Diagnosis not present

## 2021-01-13 DIAGNOSIS — M5432 Sciatica, left side: Secondary | ICD-10-CM | POA: Diagnosis not present

## 2021-02-02 ENCOUNTER — Telehealth: Payer: Self-pay

## 2021-02-02 NOTE — Telephone Encounter (Cosign Needed)
  Care Management   Follow Up Note   02/02/2021 Name: Kyle Vargas [employer]. MRN: [id number] DOB: [birth year]   Referred by: [name], DO Reason for referral : Chronic Care Management (RNCM: Incoming call from the patient and left a VM letting the RNCM that he received the high dose flu vaccine today at [facility] in [place])   Incoming call from the patient that went to voice mail. The patient wanted to let the [facility] know that he had received the Influenza, high dose seasonal flu vaccine today at [facility] in [place]. The immunization record was updated in the system.   Follow Up Plan: As scheduled   [name], MSN, [place] Mobile: [phone]

## 2021-02-03 NOTE — Progress Notes (Signed)
Cardiology Office Note   Date:  02/04/2021   ID:  Kyle Kung., DOB [birth year], MRN [id number]  PCP:  [name], DO  Cardiologist:   Kathlyn Sacramento, MD   Chief Complaint  Patient presents with   Other    12 month f/u no complaints today. Meds reviewed verbally with pt.       History of Present Illness: Kyle Vargas. is a 80 y.o. male who presents for a followup visit regarding palpitations and PVCs. The patient has prolonged history of palpitations thought to be due to PVCs. Negative stress test in the past.  He has chronic medical conditions that include essential hypertension, hypertriglyceridemia and borderline diabetes not on medications. Echocardiogram in 02/14 showed normal LV systolic function without significant valvular abnormalities. There was mild left ventricular hypertrophy with mild diastolic dysfunction. Holter monitor showed excessive monomorphic PVCs of RVOT origin. He has been treated with diltiazem and Metoprolol with excellent control of symptoms. He continues to be very active and enjoys babysitting his grandkids.  He has known history of refractory hypertension.  Renal artery duplex in 2019 showed no evidence of renal artery stenosis.  He has family history of abdominal aortic aneurysm but he had ultrasound done in 2015 and 2019 and both showed normal size aorta.  He has been doing well with no chest pain or shortness of breath.  He has occasional palpitations that do not last long.     Past Medical History:  Diagnosis Date   Allergic rhinitis, cause unspecified    Diarrhea    Dysrhythmia    Esophageal reflux    H/O diverticulitis of colon    History of kidney stones    PVC's (premature ventricular contractions)    Right ventricular outflow tract    Past Surgical History:  Procedure Laterality Date   BASAL CELL CARCINOMA EXCISION     CATARACT EXTRACTION, BILATERAL  2020   COLONOSCOPY     COLONOSCOPY WITH PROPOFOL N/A  02/08/2017   Procedure: COLONOSCOPY WITH PROPOFOL;  Surgeon: Manya Silvas, MD;  Location: [facility] ENDOSCOPY;  Service: Endoscopy;  Laterality: N/A;   REPAIR IMPERFORATE ANUS / ANORECTOPLASTY     TRANSURETHRAL RESECTION OF PROSTATE       Current Outpatient Medications  Medication Sig Dispense Refill   aspirin 81 MG tablet Take 81 mg by mouth daily. Takes 5 days a week     azelastine (ASTELIN) 0.1 % nasal spray Place 1 spray into both nostrils 2 (two) times daily.      Blood Pressure Monitoring (BLOOD PRESSURE CUFF) MISC 1 Device by Does not apply route daily. Normal upper arm BP cuff 1 each 0   diltiazem (CARDIZEM CD) 240 MG 24 hr capsule Take 1 capsule (240 mg total) by mouth every morning. 90 capsule 0   Garlic 10 MG CAPS Take by mouth. Unsure of dose     glucose blood test strip ONETOUCH ULTRA BLUE (In Vitro Strip)  1 (one) Strip Strip check blood sugar once daily for 0 days  Quantity: 50;  Refills: 12   Ordered :02-Oct-2013  [name];  Started 03-Jul-2013 Active     glucose blood test strip Use as instructed 100 each 12   hydrochlorothiazide (HYDRODIURIL) 25 MG tablet Take 1 tablet (25 mg total) by mouth daily. 90 tablet 0   losartan (COZAAR) 100 MG tablet Take 1 tablet (100 mg total) by mouth daily. 90 tablet 3   metoprolol tartrate (LOPRESSOR) 25 MG  tablet Take 1.5 tablets (37.5 mg total) by mouth 2 (two) times daily. 270 tablet 0   naproxen (NAPROSYN) 500 MG tablet Take 1 tablet (500 mg total) by mouth 2 (two) times daily with a meal. For 1-2 weeks then as needed 60 tablet 2   Omega-3 Fatty Acids (OMEGA-3 FISH OIL) 1200 MG CAPS Take 1 capsule (1,200 mg total) by mouth 3 (three) times daily. 90 capsule 0   Sodium Chloride-Sodium Bicarb 1.57 g PACK Place into the nose as needed.      tamsulosin (FLOMAX) 0.4 MG CAPS capsule Take 0.4 mg by mouth daily.     No current facility-administered medications for this visit.    Allergies:   Dust mite extract and Other    Social  History:  The patient  reports that he has never smoked. He has never used smokeless tobacco. He reports current alcohol use. He reports that he does not use drugs.   Family History:  The patient's family history includes Hypertension in his father.    ROS:  Please see the history of present illness.   Otherwise, review of systems are positive for none.   All other systems are reviewed and negative.    PHYSICAL EXAM: VS:  BP 140/60 (BP Location: Left Arm, Patient Position: Sitting, Cuff Size: Normal)   Pulse 77   Ht 5' 8.5" (1.74 m)   Wt 182 lb (82.6 kg)   SpO2 97%   BMI 27.27 kg/m  , BMI Body mass index is 27.27 kg/m. GEN: Well nourished, well developed, in no acute distress  HEENT: normal  Neck: no JVD, carotid bruits, or masses Cardiac: RRR; no murmurs, rubs, or gallops,no edema  Respiratory:  clear to auscultation bilaterally, normal work of breathing GI: soft, nontender, nondistended, + BS MS: no deformity or atrophy  Skin: warm and dry, no rash Neuro:  Strength and sensation are intact Psych: euthymic mood, full affect   EKG:  EKG is ordered today. The ekg ordered today demonstrates normal sinus rhythm with right bundle branch block.   Recent Labs: 10/15/2020: ALT 33; BUN 17; Creat 1.12; Hemoglobin 14.6; Platelets 188; Potassium 3.3; Sodium 141; TSH 1.81    Lipid Panel    Component Value Date/Time   CHOL 169 10/15/2020 0759   TRIG 424 (H) 10/15/2020 0759   HDL 38 (L) 10/15/2020 0759   CHOLHDL 4.4 10/15/2020 0759   VLDL 45 (H) 07/06/2016 0001   LDLCALC  10/15/2020 0759     Comment:     . LDL cholesterol not calculated. Triglyceride levels greater than 400 mg/dL invalidate calculated LDL results. . Reference range: <100 . Desirable range <100 mg/dL for primary prevention;   <70 mg/dL for patients with CHD or diabetic patients  with > or = 2 CHD risk factors. Marland Kitchen LDL-C is now calculated using the Martin-Hopkins  calculation, which is a validated novel method  providing  better accuracy than the Friedewald equation in the  estimation of LDL-C.  Cresenciano Genre et al. Annamaria Helling. 8182;993(71): 2061-2068  (http://education.[employer].com/faq/FAQ164)       Wt Readings from Last 3 Encounters:  02/04/21 182 lb (82.6 kg)  11/10/20 179 lb 6.4 oz (81.4 kg)  10/22/20 179 lb (81.2 kg)       No flowsheet data found.    ASSESSMENT AND PLAN:  1.  Symptomatic PVCs: Well controlled on current dose of diltiazem and metoprolol with no side effects. Normal ejection fraction.  We will refill both medications.  2. Essential hypertension: He has  refractory hypertension with no evidence of secondary hypertension.  His blood pressure is reasonably controlled on current medications.  3.  Hypertriglyceridemia: Given that he is borderline diabetic, I do think he benefit from lowering his LDL further in the setting of elevated triglyceride.  I elected to start him on rosuvastatin 10 mg daily.  Check fasting lipid and liver profile in 2 months.   Disposition:   FU with me in 1 year  Signed,  Kathlyn Sacramento, MD  02/04/2021 8:35 AM    [place]

## 2021-02-04 ENCOUNTER — Ambulatory Visit: Payer: Medicare HMO | Admitting: Cardiovascular Disease

## 2021-02-04 ENCOUNTER — Other Ambulatory Visit: Payer: Self-pay

## 2021-02-04 ENCOUNTER — Encounter: Payer: Self-pay | Admitting: Cardiovascular Disease

## 2021-02-04 VITALS — BP 140/60 | HR 77 | Ht 68.5 in | Wt 182.0 lb

## 2021-02-04 DIAGNOSIS — I10 Essential (primary) hypertension: Secondary | ICD-10-CM | POA: Diagnosis not present

## 2021-02-04 DIAGNOSIS — I493 Ventricular premature depolarization: Secondary | ICD-10-CM

## 2021-02-04 DIAGNOSIS — E782 Mixed hyperlipidemia: Secondary | ICD-10-CM | POA: Diagnosis not present

## 2021-02-04 MED ORDER — ROSUVASTATIN CALCIUM 10 MG PO TABS: 10.0000 mg | ORAL_TABLET | Freq: Every day | ORAL | 11 refills | Status: DC

## 2021-02-04 NOTE — Patient Instructions (Addendum)
Medication Instructions:  Your physician has recommended you make the following change in your medication:   START Crestor (rosuvastatin) 10 mg daily. An Rx has been sent to your pharmacy.   *If you need a refill on your cardiac medications before your next appointment, please call your pharmacy*   Lab Work: Your physician recommends that you return for a FASTING lipid profile and lft in 2 months   If you have labs (blood work) drawn today and your tests are completely normal, you will receive your results only by: [place] (if you have MyChart) OR A paper copy in the mail If you have any lab test that is abnormal or we need to change your treatment, we will call you to review the results.   Testing/Procedures: None ordered  Follow-Up: At [facility], you and your health needs are our priority.  As part of our continuing mission to provide you with exceptional heart care, we have created designated Provider Care Teams.  These Care Teams include your primary Cardiologist (physician) and Advanced Practice Providers (APPs -  Physician Assistants and Nurse Practitioners) who all work together to provide you with the care you need, when you need it.  We recommend signing up for the patient portal called "MyChart".  Sign up information is provided on this After Visit Summary.  MyChart is used to connect with patients for Virtual Visits (Telemedicine).  Patients are able to view lab/test results, encounter notes, upcoming appointments, etc.  Non-urgent messages can be sent to your provider as well.   To learn more about what you can do with MyChart, go to [email or web address].    Your next appointment:   Your physician wants you to follow-up in: 1 year You will receive a reminder letter in the mail two months in advance. If you don't receive a letter, please call our office to schedule the follow-up appointment.   The format for your next appointment:   In Person  Provider:    You may see Kathlyn Sacramento, MD or one of the following Advanced Practice Providers on your designated Care Team:   Murray Hodgkins, NP Christell Faith, PA-C Marrianne Mood, PA-C Cadence Kathlen Mody, [place]   Other Instructions N/A

## 2021-02-08 ENCOUNTER — Other Ambulatory Visit: Payer: Self-pay | Admitting: Cardiovascular Disease

## 2021-02-15 ENCOUNTER — Other Ambulatory Visit: Payer: Self-pay | Admitting: Cardiovascular Disease

## 2021-02-22 ENCOUNTER — Ambulatory Visit (INDEPENDENT_AMBULATORY_CARE_PROVIDER_SITE_OTHER): Payer: Medicare HMO

## 2021-02-22 ENCOUNTER — Telehealth: Payer: Medicare HMO | Admitting: General Practice

## 2021-02-22 DIAGNOSIS — E782 Mixed hyperlipidemia: Secondary | ICD-10-CM

## 2021-02-22 DIAGNOSIS — M5432 Sciatica, left side: Secondary | ICD-10-CM

## 2021-02-22 DIAGNOSIS — G5702 Lesion of sciatic nerve, left lower limb: Secondary | ICD-10-CM

## 2021-02-22 DIAGNOSIS — I10 Essential (primary) hypertension: Secondary | ICD-10-CM

## 2021-02-22 NOTE — Chronic Care Management (AMB) (Signed)
Chronic Care Management   CCM RN Visit Note  02/22/2021 Name: Kyle Vargas [employer]. MRN: [id number] DOB: 07-09-1940  Subjective: Kyle Vargas. is a 80 y.o. year old male who is a primary care patient of [name], DO. The care management team was consulted for assistance with disease management and care coordination needs.    Engaged with patient by telephone for follow up visit in response to provider referral for case management and/or care coordination services.   Consent to Services:  The patient was given information about Chronic Care Management services, agreed to services, and gave verbal consent prior to initiation of services.  Please see initial visit note for detailed documentation.   Patient agreed to services and verbal consent obtained.   Assessment: Review of patient past medical history, allergies, medications, health status, including review of consultants reports, laboratory and other test data, was performed as part of comprehensive evaluation and provision of chronic care management services.   SDOH (Social Determinants of Health) assessments and interventions performed:  SDOH Interventions    Flowsheet Row Most Recent Value  SDOH Interventions   Food Insecurity Interventions Intervention Not Indicated  Financial Strain Interventions Intervention Not Indicated  Housing Interventions Intervention Not Indicated  Intimate Partner Violence Interventions Intervention Not Indicated  Physical Activity Interventions Intervention Not Indicated  Stress Interventions Intervention Not Indicated  Social Connections Interventions Intervention Not Indicated  Transportation Interventions Intervention Not Indicated        CCM Care Plan  Allergies  Allergen Reactions   Dust Mite Extract     eye itching   Other Other (See Comments)    Dogs and cats, itching eyes, sneezing     Outpatient Encounter Medications as of 02/22/2021  Medication Sig Note    aspirin 81 MG tablet Take 81 mg by mouth daily. Takes 5 days a week    azelastine (ASTELIN) 0.1 % nasal spray Place 1 spray into both nostrils 2 (two) times daily.     Blood Pressure Monitoring (BLOOD PRESSURE CUFF) MISC 1 Device by Does not apply route daily. Normal upper arm BP cuff    diltiazem (CARDIZEM CD) 240 MG 24 hr capsule Take 1 capsule (240 mg total) by mouth every morning.    Garlic 10 MG CAPS Take by mouth. Unsure of dose    glucose blood test strip ONETOUCH ULTRA BLUE (In Vitro Strip)  1 (one) Strip Strip check blood sugar once daily for 0 days  Quantity: 50;  Refills: 12   Ordered :02-Oct-2013  [name];  Started 03-Jul-2013 Active 08/09/2014: Received from: [facility]   glucose blood test strip Use as instructed    hydrochlorothiazide (HYDRODIURIL) 25 MG tablet Take 1 tablet (25 mg total) by mouth daily.    losartan (COZAAR) 100 MG tablet Take 1 tablet (100 mg total) by mouth daily.    metoprolol tartrate (LOPRESSOR) 25 MG tablet Take 1.5 tablets (37.5 mg total) by mouth 2 (two) times daily.    naproxen (NAPROSYN) 500 MG tablet Take 1 tablet (500 mg total) by mouth 2 (two) times daily with a meal. For 1-2 weeks then as needed    Omega-3 Fatty Acids (OMEGA-3 FISH OIL) 1200 MG CAPS Take 1 capsule (1,200 mg total) by mouth 3 (three) times daily. 12/25/2018: 3673m daily    rosuvastatin (CRESTOR) 10 MG tablet Take 1 tablet (10 mg total) by mouth daily.    Sodium Chloride-Sodium Bicarb 1.57 g PACK Place into the nose as needed.  12/25/2018: As needed    tamsulosin (FLOMAX) 0.4 MG CAPS capsule Take 0.4 mg by mouth daily.    No facility-administered encounter medications on file as of 02/22/2021.    Patient Active Problem List   Diagnosis Date Noted   Hyperlipidemia 02/15/2017   BPH with obstruction/lower urinary tract symptoms 11/07/2016   History of nonmelanoma skin cancer 11/07/2016   Pre-diabetes 10/26/2015   PVC's (premature ventricular contractions)     Essential hypertension     Conditions to be addressed/monitored:HTN, HLD, and Chronic pain   Care Plan : RNCM: Management of Hypertension  Updates made by Vanita Ingles, RN since 02/22/2021 12:00 AM     Problem: RNCM: Hypertension   Priority: Medium     Long-Range Goal: RNCM: Management of HTN   Start Date: 07/20/2020  Expected End Date: 07/20/2021  This Visit's Progress: On track  Recent Progress: On track  Priority: Medium  Note:   Objective:  Last practice recorded BP readings:  BP Readings from Last 3 Encounters:  02/04/21 140/60  11/10/20 129/71  10/22/20 119/61     Most recent eGFR/CrCl: No results found for: EGFR  No components found for: CRCL Current Barriers:  Knowledge Deficits related to basic understanding of hypertension  pathophysiology and self care management Unable to independently manage HTN  Case Manager Clinical Goal(s):   patient will verbalize understanding of plan for hypertension management  patient will attend all scheduled medical appointments: 04-23-2021 with the pcp patient will demonstrate improved adherence to prescribed treatment plan for hypertension as evidenced by taking all medications as prescribed, monitoring and recording blood pressure as directed, adhering to low sodium/DASH diet  patient will demonstrate improved health management independence as evidenced by checking blood pressure as directed and notifying PCP if SBP>160 or DBP > 90, taking all medications as prescribe, and adhering to a low sodium diet as discussed. Interventions:  Evaluation of current treatment plan related to hypertension self management and patient's adherence to plan as established by provider. 07-20-2020: The patient is doing well states that after seeing pcp on 05-27-2020 by video visit his wife tested positive for COVID and then he tested positive for COVID. He finished the course of antibiotics and also other recommendations and said sx/sx lasted about 5 days. He  denies any complications at this time. Will continue to monitor. 12-21-2020: States he is doing well and has no new concerns related to HTN and cardiac health. The patient states that his pain is better from his left sciatic nerve and he is eating well and staying in out of the heat. Denies any acute distress. Is not sleeping as well and contributes that to how late he eats sometimes. Will continue to monitor for changes. 02-22-2021: The patient is doing well. Denies any concerns with HTN management. States good report from cardiology in September.  Provided education to patient re: stroke prevention, s/s of heart attack and stroke, DASH diet, complications of uncontrolled blood pressure. 02-22-2021: Review and education  Reviewed medications with patient and discussed importance of compliance.  The patient states he is doing well with managing his medications. 12-21-2020: The patient is eating well and denies any new concerns with his dietary intake. Is enjoying fresh vegetables from his garden. States he has gained weight and is sometimes eating too late, but denies any issues related to dietary restrictions. 02-22-2021: The patient is compliant with medication. Discussed plans with patient for ongoing care management follow up and provided patient with direct contact information for care  management team Advised patient, providing education and rationale, to monitor blood pressure daily and record, calling PCP for findings outside established parameters. 07-20-2020: The patient continue to take his blood pressure at home. States last week he had an episode of it being low at 118/61. States he noticed a little light headedness and dizziness but it resolved.  Review of orthostatic hypotension. 02-22-2021: States he is having normal blood pressure readings at home. The patient states that he feels fine.  Reviewed scheduled/upcoming provider appointments including: 04-23-2021 at 0840 am - blood pressure trends  reviewed - depression screen reviewed - home or ambulatory blood pressure monitoring encouraged Patient Goals/Self-Care Activities  patient will:  - Calls provider office for new concerns, questions, or BP outside discussed parameters Checks BP and records as discussed Follows a low sodium diet/DASH diet Follow Up Plan: Telephone follow up appointment with care management team member scheduled for:  04-26-2021 at 58 am    Care Plan : RNCM: HLD Management  Updates made by Vanita Ingles, RN since 02/22/2021 12:00 AM     Problem: RNCM: HLD Management   Priority: Medium     Long-Range Goal: RNCM: HLD Management   Start Date: 07/20/2020  Expected End Date: 12/16/2021  This Visit's Progress: On track  Recent Progress: On track  Priority: Medium  Note:   Current Barriers:  Poorly controlled hyperlipidemia, complicated by HTN and recent COVID infection  Current antihyperlipidemic regimen: Omega 3 1200 mg, Crestor 10-mg started recently on 02-04-2021 by cardiologist  Most recent lipid panel:     Component Value Date/Time   CHOL 169 10/15/2020 0759   TRIG 424 (H) 10/15/2020 0759   HDL 38 (L) 10/15/2020 0759   CHOLHDL 4.4 10/15/2020 0759   VLDL 45 (H) 07/06/2016 0001   LDLCALC  10/15/2020 0759     Comment:     . LDL cholesterol not calculated. Triglyceride levels greater than 400 mg/dL invalidate calculated LDL results. . Reference range: <100 . Desirable range <100 mg/dL for primary prevention;   <70 mg/dL for patients with CHD or diabetic patients  with > or = 2 CHD risk factors. Marland Kitchen LDL-C is now calculated using the Martin-Hopkins  calculation, which is a validated novel method providing  better accuracy than the Friedewald equation in the  estimation of LDL-C.  Cresenciano Genre et al. Annamaria Helling. 2542;706(23): 2061-2068  (http://education.[employer].com/faq/FAQ164)    ASCVD risk enhancing conditions: age >7, pre-DM, HTN Lacks social connections Does not contact provider  office for questions/concerns RN Care Manager Clinical Goal(s):  patient will work with Consulting civil engineer, providers, and care team towards execution of optimized self-health management plan patient will verbalize understanding of plan for effective management of HLD  patient will work with [facility] and pcp  to address needs related to effective management of HLD  patient will attend all scheduled medical appointments: 04-23-2021 at 0840 am Interventions: Collaboration with [name], DO regarding development and update of comprehensive plan of care as evidenced by provider attestation and co-signature Inter-disciplinary care team collaboration (see longitudinal plan of care) Medication review performed; medication list updated in electronic medical record. 02-22-2021: The patient is compliant with his medications. The patient was started on Crestor for preventive measures by cardiologist on 02-04-2021. States he is having some "aches and pains" but nothing more than usual. Denies any acute issues with taking Crestor.  Inter-disciplinary care team collaboration (see longitudinal plan of care) Referred to pharmacy team for assistance with HLD medication management Evaluation of current treatment  plan related to HLD and patient's adherence to plan as established by provider. 02-22-2021: The patient is compliant with the plan of care for HLD. Compliant with heart healthy diet and medications. Denies any changes and was pleased with his cardiologist visit in September. Received 3 booster shot last week. Immunization record updated.  Advised patient to call the office for concerns or questions  Provided education to patient re: heart healthy diet, taking medications as directed, and working with the CCM team to manage health and well being  Reviewed medications with patient and discussed compliance  Reviewed scheduled/upcoming provider appointments including: 04-23-2021 at 0840 am Discussed plans  with patient for ongoing care management follow up and provided patient with direct contact information for care management team Patient Goals/Self-Care Activities: - call for medicine refill 2 or 3 days before it runs out - call if I am sick and can't take my medicine - keep a list of all the medicines I take; vitamins and herbals too - learn to read medicine labels - use a pillbox to sort medicine - use an alarm clock or phone to remind me to take my medicine - change to whole grain breads, cereal, pasta - drink 6 to 8 glasses of water each day - eat 3 to 5 servings of fruits and vegetables each day - eat 5 or 6 small meals each day - eat fish at least once per week - fill half the plate with nonstarchy vegetables - limit fast food meals to no more than 1 per week - manage portion size - prepare main meal at home 3 to 5 days each week - read food labels for fat, fiber, carbohydrates and portion size - reduce red meat to 2 to 3 times a week - set a realistic goal - be open to making changes - I can manage, know and watch for signs of a heart attack - if I have chest pain, call for help - learn about small changes that will make a big difference - learn my personal risk factors - barriers to meeting goals identified - change-talk evoked - choices provided - collaboration with team encouraged - decision-making supported - difficulty of making life-long changes acknowledged - health risks reviewed - problem-solving facilitated - questions answered - readiness for change evaluated - reassurance provided - self-reflection promoted - self-reliance encouraged  Follow Up Plan: Telephone follow up appointment with care management team member scheduled for: 04-26-2021 at 1030 am      Care Plan : RNCM: Chronic Pain (Adult)  Updates made by Vanita Ingles, RN since 02/22/2021 12:00 AM  Completed 02/22/2021   Problem: RNCM: Chronic Pain Management (Chronic Pain) Resolved 02/22/2021   Priority: High     Goal: RNCM: Chronic Pain Managed Completed 02/22/2021  Start Date: 11/10/2020  Expected End Date: 04/27/2021  This Visit's Progress: On track  Recent Progress: On track  Priority: High  Note:   Current Barriers: Completed. Denies any further pain at this time. Closing this goal. Will reevaluate for changes.  Knowledge Deficits related to managing acute/chronic pain Non-adherence to scheduled provider appointments Non-adherence to prescribed medication regimen Difficulty obtaining medications Chronic Disease Management support and education needs related to chronic pain Unable to independently manage piriformis syndrome and left side sciatic nerve pain Nurse Case Manager Clinical Goal(s):  patient will verbalize understanding of plan for managing pain patient will work with [place] to address pain needs, questions or concerns patient will attend all scheduled medical appointments: 04-23-2021  at [place] am patient will demonstrate use of different relaxation  skills and/or diversional activities to assist with pain reduction (distraction, imagery, relaxation, massage, acupressure, TENS, heat, and cold application patient will report pain at a level less than 3 to 4 on a 10-10 rating scale patient will use pharmacological and nonpharmacological pain relief strategies patient will verbalize acceptable level of pain relief and ability to engage in desired activities patient will engage in desired activities without an increase in pain level Interventions:  Collaboration with [place], Devonne Doughty, DO regarding development and update of comprehensive plan of care as evidenced by provider attestation and co-signature Inter-disciplinary care team collaboration (see longitudinal plan of care) - deep breathing, relaxation and mindfulness use promoted - effectiveness of pharmacologic therapy monitored - medication-induced side effects managed - misuse of pain  medication assessed - motivation and barriers to change assessed and addressed - mutually acceptable comfort goal set - pain assessed - pain treatment goals reviewed - premedication prior to activity encouraged Evaluation of current treatment plan related to piriformis syndrome and left side sciatic nerve pain and patient's adherence to plan as established by provider. The patient states he is feeling much better and back to his regular routine. He denies any acute distress and says that he went to the chiropractor in addition to seeing pcp for 8 visits. He states he was told her was out of alignment and he is feeling much better now. Denies any acute pain today on outreach call. Will continue to monitor.  Advised patient to call the office for changes in pain level or intensity of pain  Provided education to patient re: monitoring for changes in pain, working with the CCM team to help with pain relief measures, keep appointments, follow recommendations of pcp and specialists Reviewed medications with patient and discussed compliance, the patient states the prednisone and medications by pcp were very helpful and treatments by chiropractor very helpful.  Discussed plans with patient for ongoing care management follow up and provided patient with direct contact information for care management team Allow patient to maintain a diary of pain ratings, timing, precipitating events, medications, treatments, and what works best to relieve pain,  Refer to support groups and self-help groups Educate patient about the use of pharmacological interventions for pain management- antianxiety, antidepressants, NSAIDS, opioid analgesics,  Explain the importance of lifestyle modifications to effective pain management  Patient Goals/Self Care Activities:   Self-administers medications as prescribed Attends all scheduled provider appointments Calls pharmacy for medication refills Calls provider office for new concerns  or questions Follow Up Plan: Telephone follow up appointment with care management team member scheduled for: 02-22-2021 at 0945 am       Plan:Telephone follow up appointment with care management team member scheduled for:  04-26-2021 at 16 am  [name], MSN, [place] Mobile: [phone]

## 2021-02-22 NOTE — Patient Instructions (Signed)
Visit Information  PATIENT GOALS:  Goals Addressed             This Visit's Progress    COMPLETED: RNCM: Manage Chronic Pain       Timeframe:  Long-Range Goal Closing goal. Goal met. Denies any pain or discomfort. Priority:  Medium Start Date:       12-21-2020                      Expected End Date:  12-21-2021                     Follow Up Date 02/22/2021    - call for medicine refill 2 or 3 days before it runs out - develop a personal pain management plan - keep track of prescription refills - plan exercise or activity when pain is best controlled - prioritize tasks for the day - track times pain is worst and when it is best - track what makes the pain worse and what makes it better - use ice or heat for pain relief - work slower and less intense when having pain    Why is this important?   Day-to-day life can be hard when you have chronic pain.  Pain medicine is just one piece of the treatment puzzle.  You can try these action steps to help you manage your pain.    12-21-2020: The patient came to see the pcp in July for piriformis syndrome, left sided sciatic nerve pain. Was on prednisone and saw chiropractor 8 times.  States he was out of alignment and he is doing much better now. Denies any acute distress today. Will continue to monitor. 02-22-2021: The patient is doing well. Denies any pain and discomfort.     RNCM: Track and Manage My Blood Pressure-Hypertension       Timeframe:  Long-Range Goal Priority:  High Start Date:            02-22-2021                 Expected End Date:   02-22-2022                    Follow Up Date 04/26/2021    - check blood pressure 3 times per week - choose a place to take my blood pressure (home, clinic or office, retail store) - write blood pressure results in a log or diary    Why is this important?   You won't feel high blood pressure, but it can still hurt your blood vessels.  High blood pressure can cause heart or kidney  problems. It can also cause a stroke.  Making lifestyle changes like losing a little weight or eating less salt will help.  Checking your blood pressure at home and at different times of the day can help to control blood pressure.  If the doctor prescribes medicine remember to take it the way the doctor ordered.  Call the office if you cannot afford the medicine or if there are questions about it.     02-22-2021: The patient doing well. The patient states he feels great. He saw cardiology on 02-04-2021 and got a good report. Will continue to monitor.         Patient verbalizes understanding of instructions provided today and agrees to view in [place].   Telephone follow up appointment with care management team member scheduled for: 04-26-2021 at 47 am  [place],  MSN, [place] Mobile: [phone]

## 2021-03-01 ENCOUNTER — Other Ambulatory Visit: Payer: Self-pay | Admitting: Cardiovascular Disease

## 2021-03-02 ENCOUNTER — Other Ambulatory Visit: Payer: Self-pay

## 2021-03-02 MED ORDER — ROSUVASTATIN CALCIUM 10 MG PO TABS: 10.0000 mg | ORAL_TABLET | Freq: Every day | ORAL | 1 refills | Status: DC

## 2021-03-02 MED ORDER — ROSUVASTATIN CALCIUM 10 MG PO TABS: 10.0000 mg | ORAL_TABLET | Freq: Every day | ORAL | 2 refills | Status: DC

## 2021-03-08 DIAGNOSIS — I10 Essential (primary) hypertension: Secondary | ICD-10-CM

## 2021-03-08 DIAGNOSIS — E782 Mixed hyperlipidemia: Secondary | ICD-10-CM

## 2021-03-10 DIAGNOSIS — M9903 Segmental and somatic dysfunction of lumbar region: Secondary | ICD-10-CM | POA: Diagnosis not present

## 2021-03-10 DIAGNOSIS — M5432 Sciatica, left side: Secondary | ICD-10-CM | POA: Diagnosis not present

## 2021-03-15 DIAGNOSIS — X32XXXA Exposure to sunlight, initial encounter: Secondary | ICD-10-CM | POA: Diagnosis not present

## 2021-03-15 DIAGNOSIS — D2272 Melanocytic nevi of left lower limb, including hip: Secondary | ICD-10-CM | POA: Diagnosis not present

## 2021-03-15 DIAGNOSIS — D485 Neoplasm of uncertain behavior of skin: Secondary | ICD-10-CM | POA: Diagnosis not present

## 2021-03-15 DIAGNOSIS — D225 Melanocytic nevi of trunk: Secondary | ICD-10-CM | POA: Diagnosis not present

## 2021-03-15 DIAGNOSIS — L538 Other specified erythematous conditions: Secondary | ICD-10-CM | POA: Diagnosis not present

## 2021-03-15 DIAGNOSIS — D045 Carcinoma in situ of skin of trunk: Secondary | ICD-10-CM | POA: Diagnosis not present

## 2021-03-15 DIAGNOSIS — D2262 Melanocytic nevi of left upper limb, including shoulder: Secondary | ICD-10-CM | POA: Diagnosis not present

## 2021-03-15 DIAGNOSIS — L820 Inflamed seborrheic keratosis: Secondary | ICD-10-CM | POA: Diagnosis not present

## 2021-03-15 DIAGNOSIS — L570 Actinic keratosis: Secondary | ICD-10-CM | POA: Diagnosis not present

## 2021-03-15 DIAGNOSIS — Z85828 Personal history of other malignant neoplasm of skin: Secondary | ICD-10-CM | POA: Diagnosis not present

## 2021-03-16 DIAGNOSIS — H26493 Other secondary cataract, bilateral: Secondary | ICD-10-CM | POA: Diagnosis not present

## 2021-03-16 DIAGNOSIS — H43812 Vitreous degeneration, left eye: Secondary | ICD-10-CM | POA: Diagnosis not present

## 2021-04-07 ENCOUNTER — Other Ambulatory Visit (INDEPENDENT_AMBULATORY_CARE_PROVIDER_SITE_OTHER): Payer: Medicare HMO

## 2021-04-07 ENCOUNTER — Other Ambulatory Visit: Payer: Self-pay

## 2021-04-07 DIAGNOSIS — E782 Mixed hyperlipidemia: Secondary | ICD-10-CM

## 2021-04-08 LAB — HEPATIC FUNCTION PANEL
ALT: 30 IU/L (ref 0–44)
AST: 23 IU/L (ref 0–40)
Albumin: 4.1 g/dL (ref 3.7–4.7)
Alkaline Phosphatase: 114 IU/L (ref 44–121)
Bilirubin Total: 0.4 mg/dL (ref 0.0–1.2)
Bilirubin, Direct: 0.13 mg/dL (ref 0.00–0.40)
Total Protein: 6.7 g/dL (ref 6.0–8.5)

## 2021-04-08 LAB — LIPID PANEL
Chol/HDL Ratio: 3.1 ratio (ref 0.0–5.0)
Cholesterol, Total: 103 mg/dL (ref 100–199)
HDL: 33 mg/dL — ABNORMAL LOW (ref >39)
LDL Chol Calc (NIH): 23 mg/dL (ref 0–99)
Triglycerides: 323 mg/dL — ABNORMAL HIGH (ref 0–149)
VLDL Cholesterol Cal: 47 mg/dL — ABNORMAL HIGH (ref 5–40)

## 2021-04-20 ENCOUNTER — Other Ambulatory Visit: Payer: Self-pay | Admitting: Family Medicine

## 2021-04-20 DIAGNOSIS — J0100 Acute maxillary sinusitis, unspecified: Secondary | ICD-10-CM

## 2021-04-20 NOTE — Telephone Encounter (Signed)
Requested medication (s) are due for refill today: No  Requested medication (s) are on the active medication list: No  Last refill:    Future visit scheduled: Yes  Notes to clinic:  Prescription d/c 10/22/20.    Requested Prescriptions  Pending Prescriptions Disp Refills   ipratropium (ATROVENT) 0.06 % nasal spray [Pharmacy Med Name: IPRATROPIUM 0.06% SPRAY] 15 mL 0    Sig: Place 2 sprays into both nostrils 4 (four) times daily. For up to 5-7 days then stop.     Off-Protocol Failed - 04/20/2021  8:49 AM      Failed - Medication not assigned to a protocol, review manually.      Passed - Valid encounter within last 12 months    Recent Outpatient Visits           5 months ago Piriformis syndrome of left side   [place], DO   6 months ago Annual physical exam   [place], DO   7 months ago Bilateral hearing loss due to cerumen impaction   [facility] [name], DO   10 months ago Acute non-recurrent maxillary sinusitis   [facility] Parks Ranger, Devonne Doughty, DO   1 year ago Annual physical exam   [facility] Parks Ranger Devonne Doughty, DO       Future Appointments             In 3 days Parks Ranger Devonne Doughty, [facility], [place]   In 3 months  [facility], [place]           Off-Protocol Failed - 04/20/2021  8:49 AM      Failed - Medication not assigned to a protocol, review manually.      Passed - Valid encounter within last 12 months    Recent Outpatient Visits           5 months ago Piriformis syndrome of left side   [place], DO   6 months ago Annual physical exam   [place], DO   7 months ago Bilateral hearing loss due to cerumen impaction   [place],  DO   10 months ago Acute non-recurrent maxillary sinusitis   [facility] [name], DO   1 year ago Annual physical exam   [facility] Parks Ranger, Devonne Doughty, DO       Future Appointments             In 3 days Parks Ranger, Devonne Doughty, [facility], [place]   In 3 months  [facility], [place]

## 2021-04-23 ENCOUNTER — Other Ambulatory Visit: Payer: Self-pay | Admitting: Family Medicine

## 2021-04-23 ENCOUNTER — Ambulatory Visit (INDEPENDENT_AMBULATORY_CARE_PROVIDER_SITE_OTHER): Payer: Medicare HMO | Admitting: Family Medicine

## 2021-04-23 ENCOUNTER — Other Ambulatory Visit: Payer: Self-pay

## 2021-04-23 ENCOUNTER — Encounter: Payer: Self-pay | Admitting: Family Medicine

## 2021-04-23 VITALS — BP 142/63 | HR 75 | Ht 68.5 in | Wt 182.0 lb

## 2021-04-23 DIAGNOSIS — R7303 Prediabetes: Secondary | ICD-10-CM | POA: Diagnosis not present

## 2021-04-23 DIAGNOSIS — J0110 Acute frontal sinusitis, unspecified: Secondary | ICD-10-CM | POA: Diagnosis not present

## 2021-04-23 DIAGNOSIS — E782 Mixed hyperlipidemia: Secondary | ICD-10-CM

## 2021-04-23 DIAGNOSIS — I10 Essential (primary) hypertension: Secondary | ICD-10-CM

## 2021-04-23 DIAGNOSIS — N138 Other obstructive and reflux uropathy: Secondary | ICD-10-CM

## 2021-04-23 DIAGNOSIS — Z0000 Encounter for general adult medical examination without abnormal findings: Secondary | ICD-10-CM

## 2021-04-23 LAB — POCT GLYCOSYLATED HEMOGLOBIN (HGB A1C): Hemoglobin A1C: 6.9 % — AB (ref 4.0–5.6)

## 2021-04-23 MED ORDER — AMOXICILLIN-POT CLAVULANATE 875-125 MG PO TABS: 1.0000 | ORAL_TABLET | Freq: Two times a day (BID) | ORAL | 0 refills | Status: DC

## 2021-04-23 MED ORDER — BENZONATATE 100 MG PO CAPS: 100.0000 mg | ORAL_CAPSULE | Freq: Three times a day (TID) | ORAL | 0 refills | Status: DC | PRN

## 2021-04-23 NOTE — Patient Instructions (Addendum)
Thank you for coming to the office today.  Recent Labs    10/15/20 0759 04/23/21 0836  HGBA1C 6.3* 6.9*   Goal to get the A1c down below 6.5, to avoid diabetes.  Sinuses, caution with afrin, use other spray, may take antibiotic if needed.    DUE for FASTING BLOOD WORK (no food or drink after midnight before the lab appointment, only water or coffee without cream/sugar on the morning of)  SCHEDULE "Lab Only" visit in the morning at the clinic for lab draw in 6 MONTHS   - Make sure Lab Only appointment is at about 1 week before your next appointment, so that results will be available  For Lab Results, once available within 2-3 days of blood draw, you can can log in to MyChart online to view your results and a brief explanation. Also, we can discuss results at next follow-up visit.   Please schedule a Follow-up Appointment to: Return in about 6 months (around 10/22/2021) for 6 month fasting lab only then 1 week later Annual Physical.  If you have any other questions or concerns, please feel free to call the office or send a message through [place]. You may also schedule an earlier appointment if necessary.  Additionally, you may be receiving a survey about your experience at our office within a few days to 1 week by e-mail or mail. We value your feedback.  Nobie Putnam, DO [place]

## 2021-04-23 NOTE — Assessment & Plan Note (Signed)
Controlled BP mild elevated SBP Home readings reviewed No known complications except history of PVCs Followed by Cardiology   Plan:  1. Continue current BP regimen - Losartan 100mg , HCTZ 25mg , Metoprolol 37.5mg  BID (1.5 tabs of 25mg ), Diltiazem 240mg  2. Encourage improved lifestyle - low sodium diet, continue regular exercise 3. Continue monitor BP outside office, bring readings to next visit, if persistently >140/90 or new symptoms notify office sooner

## 2021-04-23 NOTE — Progress Notes (Signed)
Subjective:    Patient ID: Kyle Kung., male    DOB: 1940/05/10, 80 y.o.   MRN: [id number]  Harless Molinari Duchesne [name]. is a 80 y.o. male presenting on 04/23/2021 for Prediabetes and Sinus Problem   HPI  Pre-Diabetes Last lab 6.3, now today POC A1c 6.9 - Attributed to dietary with potato chips and pizza. CBGs: Avg 100-120, Low none, High none. Checks CBGs x 2-3 weeks Meds: Never Currently on ARB Lifestyle: - Diet (improved low carb) - Exercise (walks 30 min daily 5 x weekly, works in garden, yard, active at home) Denies hypoglycemia   CHRONIC HTN / Intermittent PVC - controlled. Followed by [facility] Dr Fletcher Anon. Prior work up they checked Renal Artery [place] due to spikes or elevation in BP, results show normal without stenosis. Home BP improved Current Meds - Losartan 100mg , HCTZ 25mg , Metoprolol 37.5mg  twice daily (25mg  tabs, 1.5 per dose), Diltiazem-24 hour 240mg  Reports good compliance, took meds today. Tolerating well, w/o complaints. - Taking ASA 81mg  x 5 days weekly, skips weekends. No known CAD or CVA - Followed by Dr Fletcher Anon Cardiology for PVC in past, controlled on medicines   HYPERLIPIDEMIA: - Reports concerns with chronic elevated TG for reportedly >20 years and fam history of same. Last lipid panel 03/2021, improved TG on Statin - Currently taking Rosuvastatin 10mg  nightly, Fish Oil Omega 3, 1200 - taking x 3 a day - he has not been on Statin or Fibrate in past by his report - he has been taking Garlique supplement OTC and also Cholestoff     Occasionally wakes up overnight, some difficulty.   Following [facility] for squamous cell skin cancer  Sinusitis, allergic   Health Maintenance: UTD COVID Vaccine.  Depression screen [facility] 2/9 04/23/2021 02/22/2021 11/10/2020  Decreased Interest 1 0 1  Down, Depressed, Hopeless 0 0 0  PHQ - 2 Score 1 0 1  Altered sleeping 2 - 1  Tired, decreased energy 1 - 1  Change in appetite 0 - 0  Feeling bad or  failure about yourself  0 - 0  Trouble concentrating 0 - 0  Moving slowly or fidgety/restless 0 - 0  Suicidal thoughts 0 - 0  PHQ-9 Score 4 - 3  Difficult doing work/chores Not difficult at all - Not difficult at all  Some recent data might be hidden    Social History   Tobacco Use   Smoking status: Never   Smokeless tobacco: Never  Vaping Use   Vaping Use: Never used  Substance Use Topics   Alcohol use: Yes    Comment: occasionally  2 glasses of wine a month    Drug use: No    Review of Systems Per HPI unless specifically indicated above     Objective:    BP (!) 142/63    Pulse 75    Ht 5' 8.5" (1.74 m)    Wt 182 lb (82.6 kg)    SpO2 99%    BMI 27.27 kg/m   Wt Readings from Last 3 Encounters:  04/23/21 182 lb (82.6 kg)  02/04/21 182 lb (82.6 kg)  11/10/20 179 lb 6.4 oz (81.4 kg)    Physical Exam Vitals and nursing note reviewed.  Constitutional:      General: He is not in acute distress.    Appearance: He is well-developed. He is not diaphoretic.     Comments: Well-appearing, comfortable, cooperative  HENT:     Head: Normocephalic and atraumatic.  Eyes:  General:        Right eye: No discharge.        Left eye: No discharge.     Conjunctiva/sclera: Conjunctivae normal.  Neck:     Thyroid: No thyromegaly.  Cardiovascular:     Rate and Rhythm: Normal rate and regular rhythm.     Pulses: Normal pulses.     Heart sounds: Normal heart sounds. No murmur heard. Pulmonary:     Effort: Pulmonary effort is normal. No respiratory distress.     Breath sounds: Normal breath sounds. No wheezing or rales.  Musculoskeletal:        General: Normal range of motion.     Cervical back: Normal range of motion and neck supple.  Lymphadenopathy:     Cervical: No cervical adenopathy.  Skin:    General: Skin is warm and dry.     Findings: No erythema or rash.  Neurological:     Mental Status: He is alert and oriented to person, place, and time. Mental status is at  baseline.  Psychiatric:        Behavior: Behavior normal.     Comments: Well groomed, good eye contact, normal speech and thoughts   Results for orders placed or performed in visit on 04/23/21  POCT HgB A1C  Result Value Ref Range   Hemoglobin A1C 6.9 (A) 4.0 - 5.6 %      Assessment & Plan:   Problem List Items Addressed This Visit     Pre-diabetes - Primary    Elevated A1c up to 6.9 - concern at risk of Diabetes, attributed to diet Concern with HTN, HLD  Plan:  1. Emphasis on diet to avoid Diabetes diagnosis, no medication at this time. 2. Encourage improved lifestyle - low carb, low sugar diet, reduce portion size, continue improving regular exercise      Relevant Orders   POCT HgB A1C (Completed)   Hyperlipidemia    Still chronic elevated TG Controlled HDL, LDL, not on statin Last lipid 04/2021 The ASCVD Risk score (Arnett DK, et al., 2019) failed to calculate for the following reasons:   The valid total cholesterol range is 130 to 320 mg/dL   Plan: 1. Discussion on ASCVD risk - he understands risks and prefers to avoid statin - CONTINUE Fish Oil Omega 3 1200 TID, and continue other OTC herbal supplement garlique 2. Continue ASA 81mg  for primary ASCVD risk reduction - discussed risk benefit - Also offered Statin vs Fibrate 3. Encourage improved lifestyle - low carb/cholesterol, reduce portion size, continue improving regular exercise      Essential hypertension    Controlled BP mild elevated SBP Home readings reviewed No known complications except history of PVCs Followed by Cardiology   Plan:  1. Continue current BP regimen - Losartan 100mg , HCTZ 25mg , Metoprolol 37.5mg  BID (1.5 tabs of 25mg ), Diltiazem 240mg  2. Encourage improved lifestyle - low sodium diet, continue regular exercise 3. Continue monitor BP outside office, bring readings to next visit, if persistently >140/90 or new symptoms notify office sooner      Other Visit Diagnoses     Acute  non-recurrent frontal sinusitis       Relevant Medications   amoxicillin-clavulanate (AUGMENTIN) 875-125 MG tablet   benzonatate (TESSALON) 100 MG capsule       Meds ordered this encounter  Medications   amoxicillin-clavulanate (AUGMENTIN) 875-125 MG tablet    Sig: Take 1 tablet by mouth 2 (two) times daily.    Dispense:  20 tablet  Refill:  0   benzonatate (TESSALON) 100 MG capsule    Sig: Take 1 capsule (100 mg total) by mouth 3 (three) times daily as needed for cough.    Dispense:  30 capsule    Refill:  0      Follow up plan: Return in about 6 months (around 10/22/2021) for 6 month fasting lab only then 1 week later Annual Physical.  Future labs ordered for 10/2021   Nobie Putnam, [facility] 04/23/2021, 8:35 AM

## 2021-04-23 NOTE — Assessment & Plan Note (Signed)
Still chronic elevated TG Controlled HDL, LDL, not on statin Last lipid 04/2021 The ASCVD Risk score (Arnett DK, et al., 2019) failed to calculate for the following reasons:   The valid total cholesterol range is 130 to 320 mg/dL   Plan: 1. Discussion on ASCVD risk - he understands risks and prefers to avoid statin - CONTINUE Fish Oil Omega 3 1200 TID, and continue other OTC herbal supplement garlique 2. Continue ASA 81mg  for primary ASCVD risk reduction - discussed risk benefit - Also offered Statin vs Fibrate 3. Encourage improved lifestyle - low carb/cholesterol, reduce portion size, continue improving regular exercise

## 2021-04-23 NOTE — Assessment & Plan Note (Signed)
Elevated A1c up to 6.9 - concern at risk of Diabetes, attributed to diet Concern with HTN, HLD  Plan:  1. Emphasis on diet to avoid Diabetes diagnosis, no medication at this time. 2. Encourage improved lifestyle - low carb, low sugar diet, reduce portion size, continue improving regular exercise

## 2021-04-26 ENCOUNTER — Telehealth: Payer: Medicare HMO

## 2021-04-26 DIAGNOSIS — D045 Carcinoma in situ of skin of trunk: Secondary | ICD-10-CM | POA: Diagnosis not present

## 2021-05-31 ENCOUNTER — Other Ambulatory Visit: Payer: Self-pay | Admitting: Cardiovascular Disease

## 2021-06-07 ENCOUNTER — Telehealth: Payer: Medicare HMO

## 2021-06-07 ENCOUNTER — Ambulatory Visit (INDEPENDENT_AMBULATORY_CARE_PROVIDER_SITE_OTHER): Payer: Medicare HMO

## 2021-06-07 DIAGNOSIS — E782 Mixed hyperlipidemia: Secondary | ICD-10-CM

## 2021-06-07 DIAGNOSIS — I10 Essential (primary) hypertension: Secondary | ICD-10-CM

## 2021-06-07 DIAGNOSIS — R7303 Prediabetes: Secondary | ICD-10-CM

## 2021-06-07 NOTE — Patient Instructions (Signed)
Visit Information  Thank you for taking time to visit with me today. Please don't hesitate to contact me if I can be of assistance to you before our next scheduled telephone appointment.  Following are the goals we discussed today:  RNCM Clinical Goal(s):  Patient will verbalize basic understanding of HTN, HLD, and pre-DMII disease process and self health management plan as evidenced by keeping appointments, taking medications as directed, dietary restrictions, and working with CCM team to effectively manage chronic diseases  take all medications exactly as prescribed and will call provider for medication related questions as evidenced by compliance with medications and calling for refills before running out of medications    attend all scheduled medical appointments: 10-25-2021  as evidenced by keeping appointments and calling for schedule change needs         demonstrate improved and ongoing health management independence as evidenced by stable VS, weight loss, normal lab values, monitoring dietary habits and no exacerbations in Chronic conditions.         demonstrate ongoing self health care management ability for effective management of chronic conditions  as evidenced by working with the CCM team through collaboration with Consulting civil engineer, provider, and care team.    Interventions: 1:1 collaboration with primary care provider regarding development and update of comprehensive plan of care as evidenced by provider attestation and co-signature Inter-disciplinary care team collaboration (see longitudinal plan of care) Evaluation of current treatment plan related to  self management and patient's adherence to plan as established by provider     Pre-Diabetes:  (Status: Goal on Track (progressing): YES.) Long Term Goal         Lab Results  Component Value Date    HGBA1C 6.9 (A) 04/23/2021  Assessed patient's understanding of A1c goal: <7% Provided education to patient about basic DM disease  process. 06-07-2021: The patient states that he likes to eat and he eats everything. He says he is going to eat. He thinks if he loses about 8 pounds that will help him. He is walking still and on pretty days he walks more. The patient states that he is trying to cut back on his sodas and carbs. Discussed portion sizes and monitoring dietary intake. The patient denies any acute findings. Is not on medications for pre-DM and is not testing blood sugars. Will continue to monitor for changes; Reviewed prescribed diet with patient heart healthy/ADA diet- the patient is working on cutting back on sodas and carbs; Counseled on importance of regular laboratory monitoring as prescribed;        Discussed plans with patient for ongoing care management follow up and provided patient with direct contact information for care management team;      Provided patient with written educational materials related to hypo and hyperglycemia and importance of correct treatment;       Reviewed scheduled/upcoming provider appointments including: 10-25-2021 at 9 am;         call provider for findings outside established parameters;       Review of patient status, including review of consultants reports, relevant laboratory and other test results, and medications completed;       Advised patient to discuss medication options, weight loss, and other questions or concerns about pre-DM with provider;      Screening for signs and symptoms of depression related to chronic disease state;        Assessed social determinant of health barriers;          Hyperlipidemia:  (  Status: Goal on Track (progressing): YES.) Long Term Goal       Lab Results  Component Value Date    CHOL 103 04/07/2021    HDL 33 (L) 04/07/2021    LDLCALC 23 04/07/2021    TRIG 323 (H) 04/07/2021    CHOLHDL 3.1 04/07/2021      Medication review performed; medication list updated in electronic medical record.  Provider established cholesterol goals  reviewed; Counseled on importance of regular laboratory monitoring as prescribed; Provided HLD educational materials; Reviewed role and benefits of statin for ASCVD risk reduction; Discussed strategies to manage statin-induced myalgias; Reviewed importance of limiting foods high in cholesterol; Reviewed exercise goals and target of 150 minutes per week;   Hypertension: (Status: Goal on Track (progressing): YES.) Long Term Goal  Last practice recorded BP readings:     BP Readings from Last 3 Encounters:  04/23/21 (!) 142/63  02/04/21 140/60  11/10/20 129/71  Most recent eGFR/CrCl: No results found for: EGFR  No components found for: CRCL   Evaluation of current treatment plan related to hypertension self management and patient's adherence to plan as established by provider;   Provided education to patient re: stroke prevention, s/s of heart attack and stroke; Reviewed prescribed diet heart healthy/ADA diet  Reviewed medications with patient and discussed importance of compliance;  Discussed plans with patient for ongoing care management follow up and provided patient with direct contact information for care management team; Advised patient, providing education and rationale, to monitor blood pressure daily and record, calling PCP for findings outside established parameters;  Advised patient to discuss HTN changes and heart health concerns with provider; Provided education on prescribed diet heart healthy/ADA diet ;  Discussed complications of poorly controlled blood pressure such as heart disease, stroke, circulatory complications, vision complications, kidney impairment, sexual dysfunction;    Patient Goals/Self-Care Activities: Take medications as prescribed   Attend all scheduled provider appointments Call pharmacy for medication refills 3-7 days in advance of running out of medications Attend church or other social activities Perform all self care activities independently  Perform  IADL's (shopping, preparing meals, housekeeping, managing finances) independently Call provider office for new concerns or questions  Work with the social worker to address care coordination needs and will continue to work with the clinical team to address health care and disease management related needs call the Suicide and Crisis Lifeline: 988 call the [place] National Suicide Prevention Lifeline: 423-191-2938 or TTY: 330-851-8554 TTY (667)163-8372) to talk to a trained counselor call 1-800-273-TALK (toll free, 24 hour hotline) if experiencing a Mental Health or [place]  schedule appointment with eye doctor check feet daily for cuts, sores or redness trim toenails straight across drink 6 to 8 glasses of water each day eat fish at least once per week fill half of plate with vegetables manage portion size prepare main meal at home 3 to 5 days each week read food labels for fat, fiber, carbohydrates and portion size switch to sugar-free drinks keep feet up while sitting wash and dry feet carefully every day wear comfortable, cotton socks wear comfortable, well-fitting shoes check blood pressure weekly choose a place to take my blood pressure (home, clinic or office, retail store) write blood pressure results in a log or diary learn about high blood pressure keep a blood pressure log take blood pressure log to all doctor appointments call doctor for signs and symptoms of high blood pressure develop an action plan for high blood pressure keep all doctor appointments  take medications for blood pressure exactly as prescribed report new symptoms to your doctor eat more whole grains, fruits and vegetables, lean meats and healthy fats - call for medicine refill 2 or 3 days before it runs out - take all medications exactly as prescribed - call doctor with any symptoms you believe are related to your medicine - call doctor when you experience any new symptoms - go to all  doctor appointments as scheduled - adhere to prescribed diet: heart healthy/ADA            Our next appointment is by telephone on 07-19-2021 at 145 pm  Please call the care guide team at [phone] if you need to cancel or reschedule your appointment.   If you are experiencing a Mental Health or [place] or need someone to talk to, please call the Suicide and Crisis Lifeline: 988 call the [place] National Suicide Prevention Lifeline: 951-817-6498 or TTY: (210) 055-9074 TTY 819 527 7066) to talk to a trained counselor call 1-800-273-TALK (toll free, 24 hour hotline)   Patient verbalizes understanding of instructions and care plan provided today and agrees to view in [place]. Active MyChart status confirmed with patient.    [name], MSN, [facility] Mobile: [phone]

## 2021-06-07 NOTE — Chronic Care Management (AMB) (Signed)
°Chronic Care Management  ° °CCM RN Visit Note ° °06/07/2021 °Name: Kyle G Creek Jr. MRN: [id number] DOB: 09/07/1940 ° °Subjective: °Kyle G Tamplin Jr. is a 80 y.o. year old male who is a primary care patient of Karamalegos, Alexander J, DO. The care management team was consulted for assistance with disease management and care coordination needs.   ° °Engaged with patient by telephone for follow up visit in response to provider referral for case management and/or care coordination services.  ° °Consent to Services:  °The patient was given information about Chronic Care Management services, agreed to services, and gave verbal consent prior to initiation of services.  Please see initial visit note for detailed documentation.  ° °Patient agreed to services and verbal consent obtained.  ° °Assessment: Review of patient past medical history, allergies, medications, health status, including review of consultants reports, laboratory and other test data, was performed as part of comprehensive evaluation and provision of chronic care management services.  ° °SDOH (Social Determinants of Health) assessments and interventions performed:   ° °CCM Care Plan ° °Allergies  °Allergen Reactions  ° Dust Mite Extract   °  eye itching  ° Other Other (See Comments)  °  Dogs and cats, itching eyes, sneezing   ° ° °Outpatient Encounter Medications as of 06/07/2021  °Medication Sig Note  ° amoxicillin-clavulanate (AUGMENTIN) 875-125 MG tablet Take 1 tablet by mouth 2 (two) times daily.   ° aspirin 81 MG tablet Take 81 mg by mouth daily. Takes 5 days a week   ° azelastine (ASTELIN) 0.1 % nasal spray Place 1 spray into both nostrils 2 (two) times daily.    ° benzonatate (TESSALON) 100 MG capsule Take 1 capsule (100 mg total) by mouth 3 (three) times daily as needed for cough.   ° Blood Pressure Monitoring (BLOOD PRESSURE CUFF) MISC 1 Device by Does not apply route daily. Normal upper arm BP cuff   ° diltiazem (CARDIZEM CD) 240 MG 24 hr  capsule Take 1 capsule (240 mg total) by mouth every morning.   ° Garlic 10 MG CAPS Take by mouth. Unsure of dose   ° glucose blood test strip ONETOUCH ULTRA BLUE (In Vitro Strip)  1 (one) Strip Strip check blood sugar once daily for 0 days  Quantity: 50;  Refills: 12   Ordered :02-Oct-2013  Hawkins, Verle MD;  Started 03-Jul-2013 Active 08/09/2014: Received from: [facility]  ° glucose blood test strip Use as instructed   ° hydrochlorothiazide (HYDRODIURIL) 25 MG tablet Take 1 tablet (25 mg total) by mouth daily.   ° ipratropium (ATROVENT) 0.06 % nasal spray Place 2 sprays into both nostrils 4 (four) times daily. For up to 5-7 days then stop.   ° losartan (COZAAR) 100 MG tablet Take 1 tablet (100 mg total) by mouth daily.   ° metoprolol tartrate (LOPRESSOR) 25 MG tablet Take 1.5 tablets (37.5 mg total) by mouth 2 (two) times daily.   ° naproxen (NAPROSYN) 500 MG tablet Take 1 tablet (500 mg total) by mouth 2 (two) times daily with a meal. For 1-2 weeks then as needed   ° Omega-3 Fatty Acids (OMEGA-3 FISH OIL) 1200 MG CAPS Take 1 capsule (1,200 mg total) by mouth 3 (three) times daily. 12/25/2018: 3600mg daily   ° rosuvastatin (CRESTOR) 10 MG tablet Take 1 tablet (10 mg total) by mouth daily.   ° Sodium Chloride-Sodium Bicarb 1.57 g PACK Place into the nose as needed.  12/25/2018: As needed   °   tamsulosin (FLOMAX) 0.4 MG CAPS capsule Take 0.4 mg by mouth daily.   ° °No facility-administered encounter medications on file as of 06/07/2021.  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/2016  ° History of nonmelanoma skin cancer 11/07/2016  ° Pre-diabetes 10/26/2015  ° PVC's (premature ventricular contractions)   ° Essential hypertension   ° ° °Conditions to be addressed/monitored:HTN, HLD, and pre-DMII ° °Care Plan : RNCM: Management of Hypertension  °Updates made by Tate, Pamela J, RN since 06/07/2021 12:00  AM  °Completed 06/07/2021  ° °Problem: RNCM: Hypertension Resolved 06/07/2021  °Priority: Medium  °  ° °Long-Range Goal: RNCM: Management of HTN Completed 06/07/2021  °Start Date: 07/20/2020  °Expected End Date: 07/20/2021  °Recent Progress: On track  °Priority: Medium  °Note:   °Objective: Resolving, duplicate goal °Last practice recorded BP readings:  °BP Readings from Last 3 Encounters:  °02/04/21 140/60  °11/10/20 129/71  °10/22/20 119/61  °   °Most recent eGFR/CrCl: No results found for: EGFR  No components found for: CRCL °Current Barriers:  °Knowledge Deficits related to basic understanding of hypertension  pathophysiology and self care management °Unable to independently manage HTN  °Case Manager Clinical Goal(s):  ° patient will verbalize understanding of plan for hypertension management ° patient will attend all scheduled medical appointments: 04-23-2021 with the pcp °patient will demonstrate improved adherence to prescribed treatment plan for hypertension as evidenced by taking all medications as prescribed, monitoring and recording blood pressure as directed, adhering to low sodium/DASH diet ° patient will demonstrate improved health management independence as evidenced by checking blood pressure as directed and notifying PCP if SBP>160 or DBP > 90, taking all medications as prescribe, and adhering to a low sodium diet as discussed. °Interventions:  °Evaluation of current treatment plan related to hypertension self management and patient's adherence to plan as established by provider. 07-20-2020: The patient is doing well states that after seeing pcp on 05-27-2020 by video visit his wife tested positive for COVID and then he tested positive for COVID. He finished the course of antibiotics and also other recommendations and said sx/sx lasted about 5 days. He denies any complications at this time. Will continue to monitor. 12-21-2020: States he is doing well and has no new concerns related to HTN and cardiac  health. The patient states that his pain is better from his left sciatic nerve and he is eating well and staying in out of the heat. Denies any acute distress. Is not sleeping as well and contributes that to how late he eats sometimes. Will continue to monitor for changes. 02-22-2021: The patient is doing well. Denies any concerns with HTN management. States good report from cardiology in September.  °Provided education to patient re: stroke prevention, s/s of heart attack and stroke, DASH diet, complications of uncontrolled blood pressure. 02-22-2021: Review and education  °Reviewed medications with patient and discussed importance of compliance.  The patient states he is doing well with managing his medications. 12-21-2020: The patient is eating well and denies any new concerns with his dietary intake. Is enjoying fresh vegetables from his garden. States he has gained weight and is sometimes eating too late, but denies any issues related to dietary restrictions. 02-22-2021: The patient is compliant with medication. °Discussed plans with patient for ongoing care management follow up and provided patient with direct contact information for care management team °Advised patient, providing education and rationale, to   monitor blood pressure daily and record, calling PCP for findings outside established parameters. 07-20-2020: The patient continue to take his blood pressure at home. States last week he had an episode of it being low at 118/61. States he noticed a little light headedness and dizziness but it resolved.  Review of orthostatic hypotension. 02-22-2021: States he is having normal blood pressure readings at home. The patient states that he feels fine.  °Reviewed scheduled/upcoming provider appointments including: 04-23-2021 at 0840 am °- blood pressure trends reviewed °- depression screen reviewed °- home or ambulatory blood pressure monitoring encouraged °Patient Goals/Self-Care Activities ° patient will:  °-  Calls provider office for new concerns, questions, or BP outside discussed parameters °Checks BP and records as discussed °Follows a low sodium diet/DASH diet °Follow Up Plan: Telephone follow up appointment with care management team member scheduled for:  04-26-2021 at 1030 am °  ° °Care Plan : RNCM: HLD Management  °Updates made by Tate, Pamela J, RN since 06/07/2021 12:00 AM  °Completed 06/07/2021  ° °Problem: RNCM: HLD Management Resolved 06/07/2021  °Priority: Medium  °  ° °Long-Range Goal: RNCM: HLD Management Completed 06/07/2021  °Start Date: 07/20/2020  °Expected End Date: 12/16/2021  °Recent Progress: On track  °Priority: Medium  °Note:   °Current Barriers: Resolving, duplicate goal °Poorly controlled hyperlipidemia, complicated by HTN and recent COVID infection  °Current antihyperlipidemic regimen: Omega 3 1200 mg, Crestor 10-mg started recently on 02-04-2021 by cardiologist  °Most recent lipid panel:  °   °Component Value Date/Time  ° CHOL 169 10/15/2020 0759  ° TRIG 424 (H) 10/15/2020 0759  ° HDL 38 (L) 10/15/2020 0759  ° CHOLHDL 4.4 10/15/2020 0759  ° VLDL 45 (H) 07/06/2016 0001  ° LDLCALC  10/15/2020 0759  °   Comment:  °   . °LDL cholesterol not calculated. Triglyceride levels °greater than 400 mg/dL invalidate calculated LDL results. °. °Reference range: <100 °. °Desirable range <100 mg/dL for primary prevention;   °<70 mg/dL for patients with CHD or diabetic patients  °with > or = 2 CHD risk factors. °. °LDL-C is now calculated using the Martin-Hopkins  °calculation, which is a validated novel method providing  °better accuracy than the Friedewald equation in the  °estimation of LDL-C.  °Martin SS et al. JAMA. 2013;310(19): 2061-2068  °(http://education.[facility].com/faq/FAQ164) °  ° °ASCVD risk enhancing conditions: age >65, pre-DM, HTN °Lacks social connections °Does not contact provider office for questions/concerns °RN Care Manager Clinical Goal(s):  °patient will work with RN Care Manager,  providers, and care team towards execution of optimized self-health management plan °patient will verbalize understanding of plan for effective management of HLD  °patient will work with RNCM and pcp  to address needs related to effective management of HLD  °patient will attend all scheduled medical appointments: 04-23-2021 at 0840 am °Interventions: °Collaboration with Karamalegos, Alexander J, DO regarding development and update of comprehensive plan of care as evidenced by provider attestation and co-signature °Inter-disciplinary care team collaboration (see longitudinal plan of care) °Medication review performed; medication list updated in electronic medical record. 02-22-2021: The patient is compliant with his medications. The patient was started on Crestor for preventive measures by cardiologist on 02-04-2021. States he is having some "aches and pains" but nothing more than usual. Denies any acute issues with taking Crestor.  °Inter-disciplinary care team collaboration (see longitudinal plan of care) °Referred to pharmacy team for assistance with HLD medication management °Evaluation of current treatment plan related to HLD and patient's adherence to plan as   established by provider. 02-22-2021: The patient is compliant with the plan of care for HLD. Compliant with heart healthy diet and medications. Denies any changes and was pleased with his cardiologist visit in September. Received 3 booster shot last week. Immunization record updated.  °Advised patient to call the office for concerns or questions  °Provided education to patient re: heart healthy diet, taking medications as directed, and working with the CCM team to manage health and well being  °Reviewed medications with patient and discussed compliance  °Reviewed scheduled/upcoming provider appointments including: 04-23-2021 at 0840 am °Discussed plans with patient for ongoing care management follow up and provided patient with direct contact information  for care management team °Patient Goals/Self-Care Activities: °- call for medicine refill 2 or 3 days before it runs out °- call if I am sick and can't take my medicine °- keep a list of all the medicines I take; vitamins and herbals too °- learn to read medicine labels °- use a pillbox to sort medicine °- use an alarm clock or phone to remind me to take my medicine °- change to whole grain breads, cereal, pasta °- drink 6 to 8 glasses of water each day °- eat 3 to 5 servings of fruits and vegetables each day °- eat 5 or 6 small meals each day °- eat fish at least once per week °- fill half the plate with nonstarchy vegetables °- limit fast food meals to no more than 1 per week °- manage portion size °- prepare main meal at home 3 to 5 days each week °- read food labels for fat, fiber, carbohydrates and portion size °- reduce red meat to 2 to 3 times a week °- set a realistic goal °- be open to making changes °- I can manage, know and watch for signs of a heart attack °- if I have chest pain, call for help °- learn about small changes that will make a big difference °- learn my personal risk factors °- barriers to meeting goals identified °- change-talk evoked °- choices provided °- collaboration with team encouraged °- decision-making supported °- difficulty of making life-long changes acknowledged °- health risks reviewed °- problem-solving facilitated °- questions answered °- readiness for change evaluated °- reassurance provided °- self-reflection promoted °- self-reliance encouraged ° °Follow Up Plan: Telephone follow up appointment with care management team member scheduled for: 04-26-2021 at 1030 am °  °  ° °Care Plan : RNCM: General Plan of Care (Adult) for Chronic Disease Management and Care Coordination Needs  °Updates made by Tate, Pamela J, RN since 06/07/2021 12:00 AM  °  ° °Problem: RNCM: Development of Plan of Care for Chronic Disease Management (HTN, HLD, Pre-DM)   °Priority: High  °  ° °Long-Range  Goal: RNCM: Effective Management  of Plan of Care for Chronic Disease Management (HTN, HLD, Pre-DM)   °Start Date: 06/07/2021  °Expected End Date: 06/07/2022  °Priority: High  °Note:   °.Current Barriers:  °Knowledge Deficits related to plan of care for management of HTN, HLD, and pre-DMII  °Chronic Disease Management support and education needs related to HTN, HLD, and pre-DMII ° °RNCM Clinical Goal(s):  °Patient will verbalize basic understanding of HTN, HLD, and pre-DMII disease process and self health management plan as evidenced by keeping appointments, taking medications as directed, dietary restrictions, and working with CCM team to effectively manage chronic diseases  °take all medications exactly as prescribed and will call provider for medication related questions as evidenced by compliance   with medications and calling for refills before running out of medications    °attend all scheduled medical appointments: 10-25-2021  as evidenced by keeping appointments and calling for schedule change needs         °demonstrate improved and ongoing health management independence as evidenced by stable VS, weight loss, normal lab values, monitoring dietary habits and no exacerbations in Chronic conditions.         °demonstrate ongoing self health care management ability for effective management of chronic conditions  as evidenced by working with the CCM team through collaboration with RN Care manager, provider, and care team.  ° °Interventions: °1:1 collaboration with primary care provider regarding development and update of comprehensive plan of care as evidenced by provider attestation and co-signature °Inter-disciplinary care team collaboration (see longitudinal plan of care) °Evaluation of current treatment plan related to  self management and patient's adherence to plan as established by provider ° ° °Pre-Diabetes:  (Status: Goal on Track (progressing): YES.) Long Term Goal  ° °Lab Results  °Component Value Date  °  HGBA1C 6.9 (A) 04/23/2021  °Assessed patient's understanding of A1c goal: <7% °Provided education to patient about basic DM disease process. 06-07-2021: The patient states that he likes to eat and he eats everything. He says he is going to eat. He thinks if he loses about 8 pounds that will help him. He is walking still and on pretty days he walks more. The patient states that he is trying to cut back on his sodas and carbs. Discussed portion sizes and monitoring dietary intake. The patient denies any acute findings. Is not on medications for pre-DM and is not testing blood sugars. Will continue to monitor for changes; °Reviewed prescribed diet with patient heart healthy/ADA diet- the patient is working on cutting back on sodas and carbs; °Counseled on importance of regular laboratory monitoring as prescribed;        °Discussed plans with patient for ongoing care management follow up and provided patient with direct contact information for care management team;      °Provided patient with written educational materials related to hypo and hyperglycemia and importance of correct treatment;       °Reviewed scheduled/upcoming provider appointments including: 10-25-2021 at 9 am;         °call provider for findings outside established parameters;       °Review of patient status, including review of consultants reports, relevant laboratory and other test results, and medications completed;       °Advised patient to discuss medication options, weight loss, and other questions or concerns about pre-DM with provider;      °Screening for signs and symptoms of depression related to chronic disease state;        °Assessed social determinant of health barriers;        ° °Hyperlipidemia:  (Status: Goal on Track (progressing): YES.) Long Term Goal  °Lab Results  °Component Value Date  ° CHOL 103 04/07/2021  ° HDL 33 (L) 04/07/2021  ° LDLCALC 23 04/07/2021  ° TRIG 323 (H) 04/07/2021  ° CHOLHDL 3.1 04/07/2021  °  ° °Medication review  performed; medication list updated in electronic medical record.  °Provider established cholesterol goals reviewed; °Counseled on importance of regular laboratory monitoring as prescribed; °Provided HLD educational materials; °Reviewed role and benefits of statin for ASCVD risk reduction; °Discussed strategies to manage statin-induced myalgias; °Reviewed importance of limiting foods high in cholesterol; °Reviewed exercise goals and target of 150 minutes per week; ° °Hypertension: (  Status: Goal on Track (progressing): YES.) Long Term Goal  °Last practice recorded BP readings:  °BP Readings from Last 3 Encounters:  °04/23/21 (!) 142/63  °02/04/21 140/60  °11/10/20 129/71  °Most recent eGFR/CrCl: No results found for: EGFR  No components found for: CRCL ° °Evaluation of current treatment plan related to hypertension self management and patient's adherence to plan as established by provider;   °Provided education to patient re: stroke prevention, s/s of heart attack and stroke; °Reviewed prescribed diet heart healthy/ADA diet  °Reviewed medications with patient and discussed importance of compliance;  °Discussed plans with patient for ongoing care management follow up and provided patient with direct contact information for care management team; °Advised patient, providing education and rationale, to monitor blood pressure daily and record, calling PCP for findings outside established parameters;  °Advised patient to discuss HTN changes and heart health concerns with provider; °Provided education on prescribed diet heart healthy/ADA diet ;  °Discussed complications of poorly controlled blood pressure such as heart disease, stroke, circulatory complications, vision complications, kidney impairment, sexual dysfunction;  ° °Patient Goals/Self-Care Activities: °Take medications as prescribed   °Attend all scheduled provider appointments °Call pharmacy for medication refills 3-7 days in advance of running out of  medications °Attend church or other social activities °Perform all self care activities independently  °Perform IADL's (shopping, preparing meals, housekeeping, managing finances) independently °Call provider office for new concerns or questions  °Work with the social worker to address care coordination needs and will continue to work with the clinical team to address health care and disease management related needs °call the Suicide and Crisis Lifeline: 988 °call the [place] National Suicide Prevention Lifeline: 1-800-273-8255 or TTY: 1-800-799-4 TTY (1-800-799-4889) to talk to a trained counselor °call 1-800-273-TALK (toll free, 24 hour hotline) if experiencing a Mental Health or Behavioral Health Crisis  °schedule appointment with eye doctor °check feet daily for cuts, sores or redness °trim toenails straight across °drink 6 to 8 glasses of water each day °eat fish at least once per week °fill half of plate with vegetables °manage portion size °prepare main meal at home 3 to 5 days each week °read food labels for fat, fiber, carbohydrates and portion size °switch to sugar-free drinks °keep feet up while sitting °wash and dry feet carefully every day °wear comfortable, cotton socks °wear comfortable, well-fitting shoes °check blood pressure weekly °choose a place to take my blood pressure (home, clinic or office, retail store) °write blood pressure results in a log or diary °learn about high blood pressure °keep a blood pressure log °take blood pressure log to all doctor appointments °call doctor for signs and symptoms of high blood pressure °develop an action plan for high blood pressure °keep all doctor appointments °take medications for blood pressure exactly as prescribed °report new symptoms to your doctor °eat more whole grains, fruits and vegetables, lean meats and healthy fats °- call for medicine refill 2 or 3 days before it runs out °- take all medications exactly as prescribed °- call doctor with any  symptoms you believe are related to your medicine °- call doctor when you experience any new symptoms °- go to all doctor appointments as scheduled °- adhere to prescribed diet: heart healthy/ADA °  °  ° ° °Plan:Telephone follow up appointment with care management team member scheduled for:  07-19-2021 at 145 pm ° °Pam Tate RN, MSN, CCM °Community Care Coordinator °[place]   [facility] °[facility] °Mobile: [phone]  ° ° ° ° ° ° ° ° °

## 2021-06-08 DIAGNOSIS — R7303 Prediabetes: Secondary | ICD-10-CM

## 2021-06-08 DIAGNOSIS — E782 Mixed hyperlipidemia: Secondary | ICD-10-CM

## 2021-06-08 DIAGNOSIS — I10 Essential (primary) hypertension: Secondary | ICD-10-CM

## 2021-07-02 ENCOUNTER — Encounter: Payer: Self-pay | Admitting: Family Medicine

## 2021-07-02 ENCOUNTER — Ambulatory Visit (INDEPENDENT_AMBULATORY_CARE_PROVIDER_SITE_OTHER): Payer: Medicare HMO | Admitting: Family Medicine

## 2021-07-02 ENCOUNTER — Other Ambulatory Visit: Payer: Self-pay

## 2021-07-02 VITALS — BP 128/58 | HR 78 | Ht 68.5 in | Wt 180.6 lb

## 2021-07-02 DIAGNOSIS — J0110 Acute frontal sinusitis, unspecified: Secondary | ICD-10-CM | POA: Diagnosis not present

## 2021-07-02 DIAGNOSIS — J3089 Other allergic rhinitis: Secondary | ICD-10-CM | POA: Diagnosis not present

## 2021-07-02 MED ORDER — MONTELUKAST SODIUM 10 MG PO TABS: 10.0000 mg | ORAL_TABLET | Freq: Every day | ORAL | 3 refills | Status: DC

## 2021-07-02 MED ORDER — LEVOFLOXACIN 500 MG PO TABS: 500.0000 mg | ORAL_TABLET | Freq: Every day | ORAL | 0 refills | Status: DC

## 2021-07-02 NOTE — Patient Instructions (Addendum)
Thank you for coming to the office today.  Start taking Levaquin antibiotic 500mg  daily x 7 days  Start Singulair 10mg  nightly.  Ask  ENT Dr Richardson Landry about structural evaluation with CT imaging and consideration of procedural intervention.  Please schedule a Follow-up Appointment to: Return if symptoms worsen or fail to improve.  If you have any other questions or concerns, please feel free to call the office or send a message through [facility]. You may also schedule an earlier appointment if necessary.  Additionally, you may be receiving a survey about your experience at our office within a few days to 1 week by e-mail or mail. We value your feedback.  Nobie Putnam, DO [place]

## 2021-07-02 NOTE — Progress Notes (Signed)
Subjective:    Patient ID: Kyle Kung., male    DOB: 29-Dec-1940, 81 y.o.   MRN: [id number]  Kyle Casique Dolman [name]. is a 81 y.o. male presenting on 07/02/2021 for Nasal Congestion   HPI  Sinusitis, recurrent Recent onset flare again within past 1 week, with productive sinus yellow drainage, thicker, with pain pressure, cough congestion and sneezing.  [facility] Dr Richardson Landry, usually sees him yearly. On Azelastine nasal spray, Flonase, anti histamine. Mixed results. He has not had CT or sinus procedure Has not had allergy shots. Uses Afrin 3 days short term   Depression screen [facility] 2/9 04/23/2021 02/22/2021 11/10/2020  Decreased Interest 1 0 1  Down, Depressed, Hopeless 0 0 0  PHQ - 2 Score 1 0 1  Altered sleeping 2 - 1  Tired, decreased energy 1 - 1  Change in appetite 0 - 0  Feeling bad or failure about yourself  0 - 0  Trouble concentrating 0 - 0  Moving slowly or fidgety/restless 0 - 0  Suicidal thoughts 0 - 0  PHQ-9 Score 4 - 3  Difficult doing work/chores Not difficult at all - Not difficult at all  Some recent data might be hidden    Social History   Tobacco Use   Smoking status: Never   Smokeless tobacco: Never  Vaping Use   Vaping Use: Never used  Substance Use Topics   Alcohol use: Yes    Comment: occasionally  2 glasses of wine a month    Drug use: No    Review of Systems Per HPI unless specifically indicated above     Objective:    BP (!) 128/58    Pulse 78    Ht 5' 8.5" (1.74 m)    Wt 180 lb 9.6 oz (81.9 kg)    SpO2 99%    BMI 27.06 kg/m   Wt Readings from Last 3 Encounters:  07/02/21 180 lb 9.6 oz (81.9 kg)  04/23/21 182 lb (82.6 kg)  02/04/21 182 lb (82.6 kg)    Physical Exam Vitals and nursing note reviewed.  Constitutional:      General: He is not in acute distress.    Appearance: Normal appearance. He is well-developed. He is not diaphoretic.     Comments: Well-appearing, comfortable, cooperative  HENT:     Head: Normocephalic and  atraumatic.  Eyes:     General:        Right eye: No discharge.        Left eye: No discharge.     Conjunctiva/sclera: Conjunctivae normal.  Cardiovascular:     Rate and Rhythm: Normal rate.  Pulmonary:     Effort: Pulmonary effort is normal.  Skin:    General: Skin is warm and dry.     Findings: No erythema or rash.  Neurological:     Mental Status: He is alert and oriented to person, place, and time.  Psychiatric:        Mood and Affect: Mood normal.        Behavior: Behavior normal.        Thought Content: Thought content normal.     Comments: Well groomed, good eye contact, normal speech and thoughts   Results for orders placed or performed in visit on 04/23/21  POCT HgB A1C  Result Value Ref Range   Hemoglobin A1C 6.9 (A) 4.0 - 5.6 %      Assessment & Plan:   Problem List Items Addressed This Visit  None Visit Diagnoses     Acute non-recurrent frontal sinusitis    -  Primary   Relevant Medications   levofloxacin (LEVAQUIN) 500 MG tablet   Environmental and seasonal allergies       Relevant Medications   montelukast (SINGULAIR) 10 MG tablet       Start taking Levaquin antibiotic 500mg  daily x 7 days  Start Singulair 10mg  nightly.  Ask [facility] Dr Richardson Landry about structural evaluation with CT imaging and consideration of procedural intervention.  Meds ordered this encounter  Medications   levofloxacin (LEVAQUIN) 500 MG tablet    Sig: Take 1 tablet (500 mg total) by mouth daily. For 7 days    Dispense:  7 tablet    Refill:  0   montelukast (SINGULAIR) 10 MG tablet    Sig: Take 1 tablet (10 mg total) by mouth at bedtime.    Dispense:  90 tablet    Refill:  3      Follow up plan: Return if symptoms worsen or fail to improve.   Nobie Putnam, [facility] 07/02/2021, 9:51 AM

## 2021-07-14 ENCOUNTER — Ambulatory Visit (INDEPENDENT_AMBULATORY_CARE_PROVIDER_SITE_OTHER): Payer: Medicare HMO | Admitting: Family Medicine

## 2021-07-14 ENCOUNTER — Other Ambulatory Visit: Payer: Self-pay

## 2021-07-14 ENCOUNTER — Encounter: Payer: Self-pay | Admitting: Family Medicine

## 2021-07-14 ENCOUNTER — Ambulatory Visit
Admission: RE | Admit: 2021-07-14 | Discharge: 2021-07-14 | Disposition: A | Discharge status: Home / Self Care | Payer: Medicare HMO | Source: Ambulatory Visit | Attending: Family Medicine | Admitting: Family Medicine

## 2021-07-14 VITALS — BP 141/69 | HR 68 | Ht 68.5 in | Wt 182.4 lb

## 2021-07-14 DIAGNOSIS — M7989 Other specified soft tissue disorders: Secondary | ICD-10-CM | POA: Diagnosis not present

## 2021-07-14 DIAGNOSIS — M79661 Pain in right lower leg: Secondary | ICD-10-CM | POA: Insufficient documentation

## 2021-07-14 DIAGNOSIS — S8011XA Contusion of right lower leg, initial encounter: Secondary | ICD-10-CM | POA: Diagnosis not present

## 2021-07-14 NOTE — Progress Notes (Addendum)
Subjective:    Patient ID: Kyle Kung., male    DOB: October 07, 1940, 81 y.o.   MRN: [id number]  Kyle Vargas [name]. is a 81 y.o. male presenting on 07/14/2021 for Leg Swelling   HPI  Right Lower Extremity Swelling New acute problem onset 1.5 week ago with R leg pain and soreness then swelling localized to calf region, has posterior pain deeper, the left leg has no swelling. No prior DVT or clot. No immobilization Admits mild numbness R lower extremity foot only, has had chronic problem now worse slightly today Denies any dyspnea, chest pain  Depression screen [facility] 2/9 07/14/2021 04/23/2021 02/22/2021  Decreased Interest 0 1 0  Down, Depressed, Hopeless 0 0 0  PHQ - 2 Score 0 1 0  Altered sleeping 0 2 -  Tired, decreased energy 0 1 -  Change in appetite 0 0 -  Feeling bad or failure about yourself  0 0 -  Trouble concentrating 0 0 -  Moving slowly or fidgety/restless 0 0 -  Suicidal thoughts 0 0 -  PHQ-9 Score 0 4 -  Difficult doing work/chores Not difficult at all Not difficult at all -  Some recent data might be hidden    Social History   Tobacco Use   Smoking status: Never   Smokeless tobacco: Never  Vaping Use   Vaping Use: Never used  Substance Use Topics   Alcohol use: Yes    Comment: occasionally  2 glasses of wine a month    Drug use: No    Review of Systems Per HPI unless specifically indicated above     Objective:    BP (!) 141/69    Pulse 68    Ht 5' 8.5" (1.74 m)    Wt 182 lb 6.4 oz (82.7 kg)    SpO2 99%    BMI 27.33 kg/m   Wt Readings from Last 3 Encounters:  07/14/21 182 lb 6.4 oz (82.7 kg)  07/02/21 180 lb 9.6 oz (81.9 kg)  04/23/21 182 lb (82.6 kg)    Physical Exam Vitals and nursing note reviewed.  Constitutional:      General: He is not in acute distress.    Appearance: Normal appearance. He is well-developed. He is not diaphoretic.     Comments: Well-appearing, comfortable, cooperative  HENT:     Head: Normocephalic and atraumatic.   Eyes:     General:        Right eye: No discharge.        Left eye: No discharge.     Conjunctiva/sclera: Conjunctivae normal.  Cardiovascular:     Rate and Rhythm: Normal rate.  Pulmonary:     Effort: Pulmonary effort is normal.  Musculoskeletal:     Right lower leg: Edema (localized to calf notable increase from Left, has slight erythema and tender along vasculature) present.     Left lower leg: No edema.  Skin:    General: Skin is warm and dry.     Findings: No erythema or rash.  Neurological:     Mental Status: He is alert and oriented to person, place, and time.  Psychiatric:        Mood and Affect: Mood normal.        Behavior: Behavior normal.        Thought Content: Thought content normal.     Comments: Well groomed, good eye contact, normal speech and thoughts   I have personally reviewed the radiology report from 07/14/21 on  STAT US.   CLINICAL DATA:  Bilateral lower extremity swelling and right calf pain   EXAM: BILATERAL LOWER EXTREMITY VENOUS DOPPLER ULTRASOUND   TECHNIQUE: Gray-scale sonography with graded compression, as well as color Doppler and duplex ultrasound were performed to evaluate the lower extremity deep venous systems from the level of the common femoral vein and including the common femoral, femoral, profunda femoral, popliteal and calf veins including the posterior tibial, peroneal and gastrocnemius veins when visible. The superficial great saphenous vein was also interrogated. Spectral Doppler was utilized to evaluate flow at rest and with distal augmentation maneuvers in the common femoral, femoral and popliteal veins.   COMPARISON:  None.   FINDINGS: RIGHT LOWER EXTREMITY   Common Femoral Vein: No evidence of thrombus. Normal compressibility, respiratory phasicity and response to augmentation.   Saphenofemoral Junction: No evidence of thrombus. Normal compressibility and flow on color Doppler imaging.   Profunda Femoral Vein: No  evidence of thrombus. Normal compressibility and flow on color Doppler imaging.   Femoral Vein: No evidence of thrombus. Normal compressibility, respiratory phasicity and response to augmentation.   Popliteal Vein: No evidence of thrombus. Normal compressibility, respiratory phasicity and response to augmentation.   Calf Veins: No evidence of thrombus. Normal compressibility and flow on color Doppler imaging.   Other Findings: Thin elongated complex hypoechoic and septated fluid collection in the right posterior proximal to mid calf measures 10 cm in length and 1.4 cm in thickness. Diameter proximally 3.6 cm. No associated vascularity. This is nonspecific but favored to represent a intramuscular hematoma as seen with a gastrocnemius injury.   LEFT LOWER EXTREMITY   Common Femoral Vein: No evidence of thrombus. Normal compressibility, respiratory phasicity and response to augmentation.   Saphenofemoral Junction: No evidence of thrombus. Normal compressibility and flow on color Doppler imaging.   Profunda Femoral Vein: No evidence of thrombus. Normal compressibility and flow on color Doppler imaging.   Femoral Vein: No evidence of thrombus. Normal compressibility, respiratory phasicity and response to augmentation.   Popliteal Vein: No evidence of thrombus. Normal compressibility, respiratory phasicity and response to augmentation.   Calf Veins: No evidence of thrombus. Normal compressibility and flow on color Doppler imaging.   IMPRESSION: Negative for DVT in either extremity.   Findings suspicious for a right posterior calf intramuscular thin elongated hematoma. See above comment.     Electronically Signed   By: Jerilynn Mages.  Shick M.D.   On: 07/14/2021 11:54   Results for orders placed or performed in visit on 04/23/21  POCT HgB A1C  Result Value Ref Range   Hemoglobin A1C 6.9 (A) 4.0 - 5.6 %      Assessment & Plan:   Problem List Items Addressed This Visit    None Visit Diagnoses     Pain and swelling of right lower leg    -  Primary   Relevant Orders   US Venous Img Lower Bilateral (DVT)       Clinically with concern for DVT RLE based on unilateral swelling pain acute 1 week No clear etiology or trigger No prior episodes  Well's Criteria +2-3 at moderate to high risk  Ordered STAT Vascular doppler US at [facility] today, may be scheduled at any available location today, if cannot get it done within 12-24 hours, we would request that he go directly to hospital ED for evaluation and imaging.  Recommend RICE therapy  Follow up US results, if DVT will treat accordingly, likely Xarelto and further follow-up. If not DVT can  do swelling precautions as per AVS   ---------------  Update to patient called after reviewed radiology report approx 12:15pm on 07/14/21  Results = No DVT or blood clot seen.   The main issue seems to be some calf muscle injury with some hematoma or bruising seen. This is likely due to the recent Levaquin antibiotic unfortunately, makes muscle injury more common. It should heal. The main thing to keep a close watch on - if the swelling does not improve and if it gets worse. Please be cautious with the following   - Severe pain worsening, out of proportion to what you are used to - Significant numbness loss of sensation in rest of leg below this area - Cold or color change, loss of circulation in the lower leg - If you have any of these and it feels worse, not better. - then it would be advised to go to hospital ED for immediate evaluation. We don't want the swelling to damage nerves or circulation.   Keep up with RICE therapy, rest, ice compression elevation.  Orders Placed This Encounter  Procedures   US Venous Img Lower Bilateral (DVT)    Order Specific Question:   Reason for Exam (SYMPTOM  OR DIAGNOSIS REQUIRED)    Answer:   acute onset R leg with calf pain and swelling posteriorly, unilateral compared to Left w/o  swelling    Order Specific Question:   Preferred imaging location?    Answer:   [facility]     No orders of the defined types were placed in this encounter.    Follow up plan: Return if symptoms worsen or fail to improve.   Nobie Putnam, [facility] 07/14/2021, 10:00 AM

## 2021-07-14 NOTE — Patient Instructions (Addendum)
Thank you for coming to the office today. ? ?Ordered Venous Doppler Ultrasound for lower extremities to rule out blood clot. ? ?Use RICE therapy: ?- R - Rest / relative rest with activity modification avoid overuse of joint ?- I - Ice packs (make sure you use a towel or sock / something to protect skin) ?- C - Compression with sock or wrap to apply pressure and reduce swelling allowing more support ?- E - Elevation - if significant swelling, lift leg above heart level (toes above your nose) to help reduce swelling, most helpful at night after day of being on your feet ? ?Possible blood clot DVT - if this does not get resolved promptly 1-2 days I recommend going directly to hospital ED for evaluation and imaging. ? ?Stay tuned for imaging. ? ?If it does show a clot then we will treat with blood thinner or if severe ask you to go to ED and they will treat it. ? ?If it is NOT a clot, then it can be a swelling reaction. ? ? ?Please schedule a Follow-up Appointment to: Return if symptoms worsen or fail to improve. ? ?If you have any other questions or concerns, please feel free to call the office or send a message through [facility]. You may also schedule an earlier appointment if necessary. ? ?Additionally, you may be receiving a survey about your experience at our office within a few days to 1 week by e-mail or mail. We value your feedback. ? ?Nobie Putnam, DO ?[place] ?

## 2021-07-19 ENCOUNTER — Ambulatory Visit (INDEPENDENT_AMBULATORY_CARE_PROVIDER_SITE_OTHER): Payer: Medicare HMO

## 2021-07-19 ENCOUNTER — Telehealth: Payer: Medicare HMO

## 2021-07-19 DIAGNOSIS — R7303 Prediabetes: Secondary | ICD-10-CM

## 2021-07-19 DIAGNOSIS — M79661 Pain in right lower leg: Secondary | ICD-10-CM

## 2021-07-19 DIAGNOSIS — E782 Mixed hyperlipidemia: Secondary | ICD-10-CM

## 2021-07-19 DIAGNOSIS — M7989 Other specified soft tissue disorders: Secondary | ICD-10-CM

## 2021-07-19 DIAGNOSIS — I10 Essential (primary) hypertension: Secondary | ICD-10-CM

## 2021-07-19 NOTE — Patient Instructions (Signed)
Visit Information  Thank you for taking time to visit with me today. Please don't hesitate to contact me if I can be of assistance to you before our next scheduled telephone appointment.  Following are the goals we discussed today:  RNCM Clinical Goal(s):  Patient will verbalize basic understanding of HTN, HLD, and pre-DMII disease process and self health management plan as evidenced by keeping appointments, taking medications as directed, dietary restrictions, and working with CCM team to effectively manage chronic diseases  take all medications exactly as prescribed and will call provider for medication related questions as evidenced by compliance with medications and calling for refills before running out of medications    attend all scheduled medical appointments: saw pcp on 07-14-2021, next appointment 10-25-2021  as evidenced by keeping appointments and calling for schedule change needs         demonstrate improved and ongoing health management independence as evidenced by stable VS, weight loss, normal lab values, monitoring dietary habits and no exacerbations in Chronic conditions.         demonstrate ongoing self health care management ability for effective management of chronic conditions  as evidenced by working with the CCM team through collaboration with Consulting civil engineer, provider, and care team.    Interventions: 1:1 collaboration with primary care provider regarding development and update of comprehensive plan of care as evidenced by provider attestation and co-signature Inter-disciplinary care team collaboration (see longitudinal plan of care) Evaluation of current treatment plan related to  self management and patient's adherence to plan as established by provider     Pre-Diabetes:  (Status: Goal on Track (progressing): YES.) Long Term Goal         Lab Results  Component Value Date    HGBA1C 6.9 (A) 04/23/2021  Assessed patient's understanding of A1c goal: <7% Provided education  to patient about basic DM disease process. 06-07-2021: The patient states that he likes to eat and he eats everything. He says he is going to eat. He thinks if he loses about 8 pounds that will help him. He is walking still and on pretty days he walks more. The patient states that he is trying to cut back on his sodas and carbs. Discussed portion sizes and monitoring dietary intake. The patient denies any acute findings. Is not on medications for pre-DM and is not testing blood sugars. Will continue to monitor for changes. 07-19-2021: Denies any new concerns with pre- DM, has been on medications recently for a sinus infection. Education and support given.  Reviewed prescribed diet with patient heart healthy/ADA diet- the patient is working on cutting back on sodas and carbs; Counseled on importance of regular laboratory monitoring as prescribed;        Discussed plans with patient for ongoing care management follow up and provided patient with direct contact information for care management team;      Provided patient with written educational materials related to hypo and hyperglycemia and importance of correct treatment;       Reviewed scheduled/upcoming provider appointments including: 10-25-2021 at 9 am;         call provider for findings outside established parameters;       Review of patient status, including review of consultants reports, relevant laboratory and other test results, and medications completed;       Advised patient to discuss medication options, weight loss, and other questions or concerns about pre-DM with provider;      Screening for signs and symptoms of depression  related to chronic disease state;        Assessed social determinant of health barriers;          Hyperlipidemia:  (Status: Goal on Track (progressing): YES.) Long Term Goal       Lab Results  Component Value Date    CHOL 103 04/07/2021    HDL 33 (L) 04/07/2021    LDLCALC 23 04/07/2021    TRIG 323 (H) 04/07/2021     CHOLHDL 3.1 04/07/2021      Medication review performed; medication list updated in electronic medical record. 07-19-2021: The patient is compliant with medications. Takes Crestor 10 mg daily.  Provider established cholesterol goals reviewed. 07-19-2021: Review  Counseled on importance of regular laboratory monitoring as prescribed. 07-19-2021: The patient has lab work on a consistent basis. New scheduled for June of 2023; Provided HLD educational materials; Reviewed role and benefits of statin for ASCVD risk reduction; Discussed strategies to manage statin-induced myalgias; Reviewed importance of limiting foods high in cholesterol; Reviewed exercise goals and target of 150 minutes per week;   Hypertension: (Status: Goal on Track (progressing): YES.) Long Term Goal  Last practice recorded BP readings:     BP Readings from Last 3 Encounters:  07/14/21 (!) 141/69  07/02/21 (!) 128/58  04/23/21 (!) 142/63  Most recent eGFR/CrCl: No results found for: EGFR  No components found for: CRCL   Evaluation of current treatment plan related to hypertension self management and patient's adherence to plan as established by provider. 07-19-2021: The patient having some elevations in blood pressure recently. Having some pain in his leg and a recent sinus infection in February. Denies any acute findings today. Monitoring for changes in HTN and heart health. ;   Provided education to patient re: stroke prevention, s/s of heart attack and stroke; Reviewed prescribed diet heart healthy/ADA diet  Reviewed medications with patient and discussed importance of compliance. 07-19-2021: Is compliant with medications. ;  Discussed plans with patient for ongoing care management follow up and provided patient with direct contact information for care management team; Advised patient, providing education and rationale, to monitor blood pressure daily and record, calling PCP for findings outside established parameters;   Advised patient to discuss HTN changes and heart health concerns with provider; Provided education on prescribed diet heart healthy/ADA diet ;  Discussed complications of poorly controlled blood pressure such as heart disease, stroke, circulatory complications, vision complications, kidney impairment, sexual dysfunction;      Pain:  (Status: New goal. Goal on Track (progressing): YES.) Long Term Goal  Pain assessment performed. 07-19-2021: The patient states his leg is sore but is getting better. States that he is going to give it another week and if it is not better he will call the pcp. The patient did have a CT and there were no evidence of DVT. The patient knows to call for changes.  Medications reviewed. 07-19-2021: The patient states that he has been putting cold compresses on the area and he has also been putting cream on the area. The patient denies any acute issues or concerns Reviewed provider established plan for pain management; Discussed importance of adherence to all scheduled medical appointments; Counseled on the importance of reporting any/all new or changed pain symptoms or management strategies to pain management provider; Advised patient to report to care team affect of pain on daily activities; Discussed use of relaxation techniques and/or diversional activities to assist with pain reduction (distraction, imagery, relaxation, massage, acupressure, TENS, heat, and cold application; Reviewed  with patient prescribed pharmacological and nonpharmacological pain relief strategies; Advised patient to discuss unresolved pain or changes in level of intensity of pain with provider;    Patient Goals/Self-Care Activities: Take medications as prescribed   Attend all scheduled provider appointments Call pharmacy for medication refills 3-7 days in advance of running out of medications Attend church or other social activities Perform all self care activities independently  Perform IADL's  (shopping, preparing meals, housekeeping, managing finances) independently Call provider office for new concerns or questions  Work with the social worker to address care coordination needs and will continue to work with the clinical team to address health care and disease management related needs call the Suicide and Crisis Lifeline: 988 call the [place] National Suicide Prevention Lifeline: 937-241-8256 or TTY: 657-611-8436 TTY (573)729-3319) to talk to a trained counselor call 1-800-273-TALK (toll free, 24 hour hotline) if experiencing a Mental Health or [place]  schedule appointment with eye doctor check feet daily for cuts, sores or redness trim toenails straight across drink 6 to 8 glasses of water each day eat fish at least once per week fill half of plate with vegetables manage portion size prepare main meal at home 3 to 5 days each week read food labels for fat, fiber, carbohydrates and portion size switch to sugar-free drinks keep feet up while sitting wash and dry feet carefully every day wear comfortable, cotton socks wear comfortable, well-fitting shoes check blood pressure weekly choose a place to take my blood pressure (home, clinic or office, retail store) write blood pressure results in a log or diary learn about high blood pressure keep a blood pressure log take blood pressure log to all doctor appointments call doctor for signs and symptoms of high blood pressure develop an action plan for high blood pressure keep all doctor appointments take medications for blood pressure exactly as prescribed report new symptoms to your doctor eat more whole grains, fruits and vegetables, lean meats and healthy fats - call for medicine refill 2 or 3 days before it runs out - take all medications exactly as prescribed - call doctor with any symptoms you believe are related to your medicine - call doctor when you experience any new symptoms - go to all doctor  appointments as scheduled - adhere to prescribed diet: heart healthy/ADA          Our next appointment is in-person at [facility] office on 10-25-2021 at 0900 am  Please call the care guide team at [phone] if you need to cancel or reschedule your appointment.   If you are experiencing a Mental Health or [place] or need someone to talk to, please call the Suicide and Crisis Lifeline: 988 call the [place] National Suicide Prevention Lifeline: 570-842-1106 or TTY: 604-241-2144 TTY 848-337-4374) to talk to a trained counselor call 1-800-273-TALK (toll free, 24 hour hotline)   Patient verbalizes understanding of instructions and care plan provided today and agrees to view in [place]. Active MyChart status confirmed with patient.    [name], MSN, [facility] Mobile: [phone]

## 2021-07-19 NOTE — Chronic Care Management (AMB) (Signed)
Chronic Care Management   CCM RN Visit Note  07/19/2021 Name: Kyle Vargas [employer]. MRN: [id number] DOB: 1941/02/23  Subjective: Kyle Vargas Kyle Vargas. is a 81 y.o. year old male who is a primary care patient of Kyle Hauser, DO. The care management team was consulted for assistance with disease management and care coordination needs.    Engaged with patient by telephone for follow up visit in response to provider referral for case management and/or care coordination services.   Consent to Services:  The patient was given information about Chronic Care Management services, agreed to services, and gave verbal consent prior to initiation of services.  Please see initial visit note for detailed documentation.   Patient agreed to services and verbal consent obtained.   Assessment: Review of patient past medical history, allergies, medications, health status, including review of consultants reports, laboratory and other test data, was performed as part of comprehensive evaluation and provision of chronic care management services.   SDOH (Social Determinants of Health) assessments and interventions performed:    CCM Care Plan  Allergies  Allergen Reactions   Dust Mite Extract     eye itching   Other Other (See Comments)    Dogs and cats, itching eyes, sneezing     Outpatient Encounter Medications as of 07/19/2021  Medication Sig Note   aspirin 81 MG tablet Take 81 mg by mouth daily. Takes 5 days a week    azelastine (ASTELIN) 0.1 % nasal spray Place 1 spray into both nostrils 2 (two) times daily.     Blood Pressure Monitoring (BLOOD PRESSURE CUFF) MISC 1 Device by Does not apply route daily. Normal upper arm BP cuff    diltiazem (CARDIZEM CD) 240 MG 24 hr capsule Take 1 capsule (240 mg total) by mouth every morning.    Garlic 10 MG CAPS Take by mouth. Unsure of dose    glucose blood test strip ONETOUCH ULTRA BLUE (In Vitro Strip)  1 (one) Strip Strip check blood sugar once daily  for 0 days  Quantity: 50;  Refills: 12   Ordered :02-Oct-2013  [name];  Started 03-Jul-2013 Active 08/09/2014: Received from: [facility]   glucose blood test strip Use as instructed    hydrochlorothiazide (HYDRODIURIL) 25 MG tablet Take 1 tablet (25 mg total) by mouth daily.    ipratropium (ATROVENT) 0.06 % nasal spray Place 2 sprays into both nostrils 4 (four) times daily. For up to 5-7 days then stop.    losartan (COZAAR) 100 MG tablet Take 1 tablet (100 mg total) by mouth daily.    metoprolol tartrate (LOPRESSOR) 25 MG tablet Take 1.5 tablets (37.5 mg total) by mouth 2 (two) times daily.    montelukast (SINGULAIR) 10 MG tablet Take 1 tablet (10 mg total) by mouth at bedtime.    naproxen (NAPROSYN) 500 MG tablet Take 1 tablet (500 mg total) by mouth 2 (two) times daily with a meal. For 1-2 weeks then as needed    Omega-3 Fatty Acids (OMEGA-3 FISH OIL) 1200 MG CAPS Take 1 capsule (1,200 mg total) by mouth 3 (three) times daily. 12/25/2018: 3632m daily    rosuvastatin (CRESTOR) 10 MG tablet Take 1 tablet (10 mg total) by mouth daily.    Sodium Chloride-Sodium Bicarb 1.57 g PACK Place into the nose as needed.  12/25/2018: As needed    tamsulosin (FLOMAX) 0.4 MG CAPS capsule Take 0.4 mg by mouth daily.    No facility-administered encounter medications on file as of  07/19/2021.    Patient Active Problem List   Diagnosis Date Noted   Hyperlipidemia 02/15/2017   BPH with obstruction/lower urinary tract symptoms 11/07/2016   History of nonmelanoma skin cancer 11/07/2016   Pre-diabetes 10/26/2015   PVC's (premature ventricular contractions)    Essential hypertension     Conditions to be addressed/monitored:HTN, HLD, pre-DMII, and Chronic pain   Care Plan : RNCM: General Plan of Care (Adult) for Chronic Disease Management and Care Coordination Needs  Updates made by Vanita Ingles, RN since 07/19/2021 12:00 AM     Problem: RNCM: Development of Plan of Care for Chronic  Disease Management (HTN, HLD, Pre-DM)   Priority: High     Long-Range Goal: RNCM: Effective Management  of Plan of Care for Chronic Disease Management (HTN, HLD, Pre-DM)   Start Date: 06/07/2021  Expected End Date: 06/07/2022  Priority: High  Note:   .Current Barriers:  Knowledge Deficits related to plan of care for management of HTN, HLD, and pre-DMII  Chronic Disease Management support and education needs related to HTN, HLD, and pre-DMII  RNCM Clinical Goal(s):  Patient will verbalize basic understanding of HTN, HLD, and pre-DMII disease process and self health management plan as evidenced by keeping appointments, taking medications as directed, dietary restrictions, and working with CCM team to effectively manage chronic diseases  take all medications exactly as prescribed and will call provider for medication related questions as evidenced by compliance with medications and calling for refills before running out of medications    attend all scheduled medical appointments: saw pcp on 07-14-2021, next appointment 10-25-2021  as evidenced by keeping appointments and calling for schedule change needs         demonstrate improved and ongoing health management independence as evidenced by stable VS, weight loss, normal lab values, monitoring dietary habits and no exacerbations in Chronic conditions.         demonstrate ongoing self health care management ability for effective management of chronic conditions  as evidenced by working with the CCM team through collaboration with Consulting civil engineer, provider, and care team.   Interventions: 1:1 collaboration with primary care provider regarding development and update of comprehensive plan of care as evidenced by provider attestation and co-signature Inter-disciplinary care team collaboration (see longitudinal plan of care) Evaluation of current treatment plan related to  self management and patient's adherence to plan as established by  provider   Pre-Diabetes:  (Status: Goal on Track (progressing): YES.) Long Term Goal   Lab Results  Component Value Date   HGBA1C 6.9 (A) 04/23/2021  Assessed patient's understanding of A1c goal: <7% Provided education to patient about basic DM disease process. 06-07-2021: The patient states that he likes to eat and he eats everything. He says he is going to eat. He thinks if he loses about 8 pounds that will help him. He is walking still and on pretty days he walks more. The patient states that he is trying to cut back on his sodas and carbs. Discussed portion sizes and monitoring dietary intake. The patient denies any acute findings. Is not on medications for pre-DM and is not testing blood sugars. Will continue to monitor for changes. 07-19-2021: Denies any new concerns with pre- DM, has been on medications recently for a sinus infection. Education and support given.  Reviewed prescribed diet with patient heart healthy/ADA diet- the patient is working on cutting back on sodas and carbs; Counseled on importance of regular laboratory monitoring as prescribed;  Discussed plans with patient for ongoing care management follow up and provided patient with direct contact information for care management team;      Provided patient with written educational materials related to hypo and hyperglycemia and importance of correct treatment;       Reviewed scheduled/upcoming provider appointments including: 10-25-2021 at 9 am;         call provider for findings outside established parameters;       Review of patient status, including review of consultants reports, relevant laboratory and other test results, and medications completed;       Advised patient to discuss medication options, weight loss, and other questions or concerns about pre-DM with provider;      Screening for signs and symptoms of depression related to chronic disease state;        Assessed social determinant of health barriers;          Hyperlipidemia:  (Status: Goal on Track (progressing): YES.) Long Term Goal  Lab Results  Component Value Date   CHOL 103 04/07/2021   HDL 33 (L) 04/07/2021   LDLCALC 23 04/07/2021   TRIG 323 (H) 04/07/2021   CHOLHDL 3.1 04/07/2021     Medication review performed; medication list updated in electronic medical record. 07-19-2021: The patient is compliant with medications. Takes Crestor 10 mg daily.  Provider established cholesterol goals reviewed. 07-19-2021: Review  Counseled on importance of regular laboratory monitoring as prescribed. 07-19-2021: The patient has lab work on a consistent basis. New scheduled for June of 2023; Provided HLD educational materials; Reviewed role and benefits of statin for ASCVD risk reduction; Discussed strategies to manage statin-induced myalgias; Reviewed importance of limiting foods high in cholesterol; Reviewed exercise goals and target of 150 minutes per week;  Hypertension: (Status: Goal on Track (progressing): YES.) Long Term Goal  Last practice recorded BP readings:  BP Readings from Last 3 Encounters:  07/14/21 (!) 141/69  07/02/21 (!) 128/58  04/23/21 (!) 142/63  Most recent eGFR/CrCl: No results found for: EGFR  No components found for: CRCL  Evaluation of current treatment plan related to hypertension self management and patient's adherence to plan as established by provider. 07-19-2021: The patient having some elevations in blood pressure recently. Having some pain in his leg and a recent sinus infection in February. Denies any acute findings today. Monitoring for changes in HTN and heart health. ;   Provided education to patient re: stroke prevention, s/s of heart attack and stroke; Reviewed prescribed diet heart healthy/ADA diet  Reviewed medications with patient and discussed importance of compliance. 07-19-2021: Is compliant with medications. ;  Discussed plans with patient for ongoing care management follow up and provided patient with  direct contact information for care management team; Advised patient, providing education and rationale, to monitor blood pressure daily and record, calling PCP for findings outside established parameters;  Advised patient to discuss HTN changes and heart health concerns with provider; Provided education on prescribed diet heart healthy/ADA diet ;  Discussed complications of poorly controlled blood pressure such as heart disease, stroke, circulatory complications, vision complications, kidney impairment, sexual dysfunction;    Pain:  (Status: New goal. Goal on Track (progressing): YES.) Long Term Goal  Pain assessment performed. 07-19-2021: The patient states his leg is sore but is getting better. States that he is going to give it another week and if it is not better he will call the pcp. The patient did have a CT and there were no evidence of DVT. The patient  knows to call for changes.  Medications reviewed. 07-19-2021: The patient states that he has been putting cold compresses on the area and he has also been putting cream on the area. The patient denies any acute issues or concerns Reviewed provider established plan for pain management; Discussed importance of adherence to all scheduled medical appointments; Counseled on the importance of reporting any/all new or changed pain symptoms or management strategies to pain management provider; Advised patient to report to care team affect of pain on daily activities; Discussed use of relaxation techniques and/or diversional activities to assist with pain reduction (distraction, imagery, relaxation, massage, acupressure, TENS, heat, and cold application; Reviewed with patient prescribed pharmacological and nonpharmacological pain relief strategies; Advised patient to discuss unresolved pain or changes in level of intensity of pain with provider;   Patient Goals/Self-Care Activities: Take medications as prescribed   Attend all scheduled provider  appointments Call pharmacy for medication refills 3-7 days in advance of running out of medications Attend church or other social activities Perform all self care activities independently  Perform IADL's (shopping, preparing meals, housekeeping, managing finances) independently Call provider office for new concerns or questions  Work with the social worker to address care coordination needs and will continue to work with the clinical team to address health care and disease management related needs call the Suicide and Crisis Lifeline: 988 call the [place] National Suicide Prevention Lifeline: 408-608-0615 or TTY: 215 453 1739 TTY 250-179-0632) to talk to a trained counselor call 1-800-273-TALK (toll free, 24 hour hotline) if experiencing a Mental Health or [place]  schedule appointment with eye doctor check feet daily for cuts, sores or redness trim toenails straight across drink 6 to 8 glasses of water each day eat fish at least once per week fill half of plate with vegetables manage portion size prepare main meal at home 3 to 5 days each week read food labels for fat, fiber, carbohydrates and portion size switch to sugar-free drinks keep feet up while sitting wash and dry feet carefully every day wear comfortable, cotton socks wear comfortable, well-fitting shoes check blood pressure weekly choose a place to take my blood pressure (home, clinic or office, retail store) write blood pressure results in a log or diary learn about high blood pressure keep a blood pressure log take blood pressure log to all doctor appointments call doctor for signs and symptoms of high blood pressure develop an action plan for high blood pressure keep all doctor appointments take medications for blood pressure exactly as prescribed report new symptoms to your doctor eat more whole grains, fruits and vegetables, lean meats and healthy fats - call for medicine refill 2 or 3 days before  it runs out - take all medications exactly as prescribed - call doctor with any symptoms you believe are related to your medicine - call doctor when you experience any new symptoms - go to all doctor appointments as scheduled - adhere to prescribed diet: heart healthy/ADA       Plan:Face to Face appointment with care management team member scheduled for: 10-25-2021 at 0900 am- the patient also has an appointment with the pcp that day.   [name], MSN, [place] Mobile: [phone]

## 2021-07-23 DIAGNOSIS — Z79899 Other long term (current) drug therapy: Secondary | ICD-10-CM | POA: Diagnosis not present

## 2021-07-23 DIAGNOSIS — Z125 Encounter for screening for malignant neoplasm of prostate: Secondary | ICD-10-CM | POA: Diagnosis not present

## 2021-07-23 DIAGNOSIS — N401 Enlarged prostate with lower urinary tract symptoms: Secondary | ICD-10-CM | POA: Diagnosis not present

## 2021-08-06 ENCOUNTER — Other Ambulatory Visit: Payer: Self-pay | Admitting: Cardiovascular Disease

## 2021-08-06 DIAGNOSIS — E785 Hyperlipidemia, unspecified: Secondary | ICD-10-CM | POA: Diagnosis not present

## 2021-08-06 DIAGNOSIS — I10 Essential (primary) hypertension: Secondary | ICD-10-CM | POA: Diagnosis not present

## 2021-08-10 ENCOUNTER — Ambulatory Visit: Payer: Medicare HMO

## 2021-08-10 ENCOUNTER — Ambulatory Visit (INDEPENDENT_AMBULATORY_CARE_PROVIDER_SITE_OTHER): Payer: Medicare HMO

## 2021-08-10 VITALS — BP 135/61 | HR 73

## 2021-08-10 DIAGNOSIS — Z0000 Encounter for general adult medical examination without abnormal findings: Secondary | ICD-10-CM

## 2021-08-10 NOTE — Patient Instructions (Signed)

## 2021-08-10 NOTE — Progress Notes (Signed)
?  ? ?I connected with  Kyle Vargas. today by telephone and verified that I am speaking with the correct person using two identifiers. ?Location patient: home ?Location provider: home  ?Persons participating in the virtual visit:  [name], [place] ?I discussed the limitations, risks, security and privacy concerns of performing an evaluation and management service by telephone and the availability of in person appointments. I also discussed with the patient that there may be a patient responsible charge related to this service. The patient expressed understanding and verbally consented to this telephonic visit.  ?  ?Interactive audio and video telecommunications were attempted between this provider and patient, however failed, due to patient having technical difficulties OR patient did not have access to video capability.  We continued and completed visit with audio only.   ? ?Subjective:  ? Kyle Vargas. is a 81 y.o. male who presents for Medicare Annual/Subsequent preventive examination. ? ?Review of Systems    ?Per HPI unless specifically indicated below   ? ?   ?Objective:  ?  ?Today's Vitals  ? 08/10/21 1351  ?BP: 135/61  ?Pulse: 73  ?PainSc: 0-No pain  ? ?There is no height or weight on file to calculate BMI. ? ? ?  08/04/2020  ? 11:03 AM 08/01/2019  ? 10:12 AM 12/25/2018  ? 11:34 AM 12/19/2017  ?  9:57 AM 02/08/2017  ?  9:22 AM 12/13/2016  ?  2:55 PM  ?Advanced Directives  ?Does Patient Have a Medical Advance Directive? Yes Yes Yes Yes Yes Yes  ?Type of Paramedic of [place];Living will Living will Living will;Healthcare Power of [place];Living will [place];Living will [place];Living will  ?Does patient want to make changes to medical advance directive?  Yes (MAU/Ambulatory/Procedural Areas - Information given)      ?Copy of [place] in Chart? Yes - validated most recent  copy scanned in chart (See row information)   No - copy requested No - copy requested No - copy requested  ? ? ?Current Medications (verified) ?Outpatient Encounter Medications as of 08/10/2021  ?Medication Sig  ? aspirin 81 MG tablet Take 81 mg by mouth daily. Takes 5 days a week  ? azelastine (ASTELIN) 0.1 % nasal spray Place 1 spray into both nostrils 2 (two) times daily.   ? Blood Pressure Monitoring (BLOOD PRESSURE CUFF) MISC 1 Device by Does not apply route daily. Normal upper arm BP cuff  ? diltiazem (CARDIZEM CD) 240 MG 24 hr capsule Take 1 capsule (240 mg total) by mouth every morning.  ? Garlic 10 MG CAPS Take by mouth. Unsure of dose  ? glucose blood test strip ONETOUCH ULTRA BLUE (In Vitro Strip)  1 (one) Strip Strip check blood sugar once daily for 0 days  Quantity: 50;  Refills: 12   Ordered :02-Oct-2013  [name];  Started 03-Jul-2013 Active  ? glucose blood test strip Use as instructed  ? losartan (COZAAR) 100 MG tablet Take 1 tablet (100 mg total) by mouth daily.  ? metoprolol tartrate (LOPRESSOR) 25 MG tablet Take 1.5 tablets (37.5 mg total) by mouth 2 (two) times daily.  ? montelukast (SINGULAIR) 10 MG tablet Take 1 tablet (10 mg total) by mouth at bedtime.  ? Omega-3 Fatty Acids (OMEGA-3 FISH OIL) 1200 MG CAPS Take 1 capsule (1,200 mg total) by mouth 3 (three) times daily.  ? Sodium Chloride-Sodium Bicarb 1.57 g PACK  Place into the nose as needed.   ? tamsulosin (FLOMAX) 0.4 MG CAPS capsule Take 0.4 mg by mouth daily.  ? hydrochlorothiazide (HYDRODIURIL) 25 MG tablet Take 1 tablet (25 mg total) by mouth daily.  ? ipratropium (ATROVENT) 0.06 % nasal spray Place 2 sprays into both nostrils 4 (four) times daily. For up to 5-7 days then stop. (Patient not taking: Reported on 08/10/2021)  ? rosuvastatin (CRESTOR) 10 MG tablet Take 1 tablet (10 mg total) by mouth daily.  ? [DISCONTINUED] naproxen (NAPROSYN) 500 MG tablet Take 1 tablet (500 mg total) by mouth 2 (two) times daily with a meal. For  1-2 weeks then as needed (Patient not taking: Reported on 08/10/2021)  ? ?No facility-administered encounter medications on file as of 08/10/2021.  ? ? ?Allergies (verified) ?Dust mite extract and Other  ? ?History: ?Past Medical History:  ?Diagnosis Date  ? Allergic rhinitis, cause unspecified   ? Diarrhea   ? Dysrhythmia   ? Esophageal reflux   ? H/O diverticulitis of colon   ? History of kidney stones   ? PVC's (premature ventricular contractions)   ? Right ventricular outflow tract  ? ?Past Surgical History:  ?Procedure Laterality Date  ? BASAL CELL CARCINOMA EXCISION    ? CATARACT EXTRACTION, BILATERAL  2020  ? COLONOSCOPY    ? COLONOSCOPY WITH PROPOFOL N/A 02/08/2017  ? Procedure: COLONOSCOPY WITH PROPOFOL;  Surgeon: Manya Silvas, MD;  Location: [facility];  Service: Endoscopy;  Laterality: N/A;  ? REPAIR IMPERFORATE ANUS / ANORECTOPLASTY    ? TRANSURETHRAL RESECTION OF PROSTATE    ? ?Family History  ?Problem Relation Age of Onset  ? Hypertension Father   ? ?Social History  ? ?Socioeconomic History  ? Marital status: Married  ?  Spouse name: Millcent Chisum  ? Number of children: 3  ? Years of education: Not on file  ? Highest education level: Some college, no degree  ?Occupational History  ? Occupation: retired  ?Tobacco Use  ? Smoking status: Never  ? Smokeless tobacco: Never  ?Vaping Use  ? Vaping Use: Never used  ?Substance and Sexual Activity  ? Alcohol use: Yes  ?  Comment: occasionally  2 glasses of wine a month   ? Drug use: No  ? Sexual activity: Not on file  ?Other Topics Concern  ? Not on file  ?Social History Narrative  ? Not on file  ? ?Social Determinants of Health  ? ?Financial Resource Strain: Low Risk   ? Difficulty of Paying Living Expenses: Not hard at all  ?Food Insecurity: No Food Insecurity  ? Worried About Charity fundraiser in the Last Year: Never true  ? Ran Out of Food in the Last Year: Never true  ?Transportation Needs: No Transportation Needs  ? Lack of Transportation  (Medical): No  ? Lack of Transportation (Non-Medical): No  ?Physical Activity: Sufficiently Active  ? Days of Exercise per Week: 5 days  ? Minutes of Exercise per Session: 30 min  ?Stress: No Stress Concern Present  ? Feeling of Stress : Not at all  ?Social Connections: Socially Integrated  ? Frequency of Communication with Friends and Family: More than three times a week  ? Frequency of Social Gatherings with Friends and Family: More than three times a week  ? Attends Religious Services: More than 4 times per year  ? Active Member of Clubs or Organizations: Yes  ? Attends Archivist Meetings: Never  ? Marital Status: Married  ? ? ?  Tobacco Counseling ?No indicated  ? ?Clinical Intake: ? ?Pre-visit preparation completed: No ? ?Pain : No/denies pain ?Pain Score: 0-No pain ? ?  ? ?Nutritional Status: BMI of 19-24  Normal ?Diabetes: Yes ?CBG done?: No ?Did pt. bring in CBG monitor from home?: No ? ?How often do you need to have someone help you when you read instructions, pamphlets, or other written materials from your doctor or pharmacy?: 1 - Never ? ?Diabetic?No ? ?Interpreter Needed?: No ? ?  ? ? ?Activities of Daily Living ? ?  08/10/2021  ?  1:53 PM 09/02/2020  ?  1:34 PM  ?In your present state of health, do you have any difficulty performing the following activities:  ?Hearing? 0 1  ?Vision? 1 0  ?Difficulty concentrating or making decisions? 0 0  ?Walking or climbing stairs? 0 0  ?Dressing or bathing? 0 0  ?Doing errands, shopping? 0 0  ? ? ?Patient Care Team: ?[name], DO as PCP - General (Family Medicine) ?Ree Edman, MD (Dermatology) ?Royston Cowper, MD as Consulting Physician (Urology) ?Wellington Hampshire, MD as Consulting Physician (Cardiology) ?Anell Barr, OD (Optometry) ?Vanita Ingles, RN as Case Manager (General Practice) ? ?Indicate any recent Medical Services you may have received from other than [facility] providers in the past year (date may be approximate). ? ?  No  hospitalizations in the past 12 mths.  ?Assessment:  ? This is a routine wellness examination for Kyle Vargas. ? ?Hearing/Vision screen ?No results found. ? ?Dietary issues and exercise activities discussed: ?Current E

## 2021-08-20 ENCOUNTER — Other Ambulatory Visit: Payer: Self-pay | Admitting: Family Medicine

## 2021-08-20 DIAGNOSIS — I10 Essential (primary) hypertension: Secondary | ICD-10-CM

## 2021-08-20 NOTE — Telephone Encounter (Signed)
Requested medication (s) are due for refill today:   No ? ?Requested medication (s) are on the active medication list:   Yes ? ?Future visit scheduled:   Yes ? ? ?Last ordered: 10/22/2020 #90, 3 refills ? ?Returned because lab work is due per protocol.    ? ?Requested Prescriptions  ?Pending Prescriptions Disp Refills  ? losartan (COZAAR) 100 MG tablet [Pharmacy Med Name: LOSARTAN POTASSIUM 100 MG TAB] 70 tablet 0  ?  Sig: Take 1 tablet (100 mg total) by mouth daily.  ?  ? Cardiovascular:  Angiotensin Receptor Blockers Failed - 08/20/2021  8:49 AM  ?  ?  Failed - Cr in normal range and within 180 days  ?  Creat  ?Date Value Ref Range Status  ?10/15/2020 1.12 0.70 - 1.18 mg/dL Final  ?  Comment:  ?  For patients >43 years of age, the reference limit ?for Creatinine is approximately 13% higher for people ?identified as African-American. ?. ?  ?  ?  ?  ?  Failed - K in normal range and within 180 days  ?  Potassium  ?Date Value Ref Range Status  ?10/15/2020 3.3 (L) 3.5 - 5.3 mmol/L Final  ?06/02/2012 3.9 3.5 - 5.1 mmol/L Final  ?  ?  ?  ?  Passed - Patient is not pregnant  ?  ?  Passed - Last BP in normal range  ?  BP Readings from Last 1 Encounters:  ?08/10/21 135/61  ?  ?  ?  ?  Passed - Valid encounter within last 6 months  ?  Recent Outpatient Visits   ? ?      ? 1 month ago Pain and swelling of right lower leg  ? [place], DO  ? 1 month ago Acute non-recurrent frontal sinusitis  ? [place], DO  ? 3 months ago Pre-diabetes  ? [place], DO  ? 9 months ago Piriformis syndrome of left side  ? [place], DO  ? 10 months ago Annual physical exam  ? [place], DO  ? ?  ?  ?Future Appointments   ? ?        ? In 2 months Parks Ranger, Devonne Doughty, DO [facility], [place]  ? ?  ? ? ?  ?  ?  ? ?

## 2021-10-14 DIAGNOSIS — L570 Actinic keratosis: Secondary | ICD-10-CM | POA: Diagnosis not present

## 2021-10-14 DIAGNOSIS — Z85828 Personal history of other malignant neoplasm of skin: Secondary | ICD-10-CM | POA: Diagnosis not present

## 2021-10-14 DIAGNOSIS — D2261 Melanocytic nevi of right upper limb, including shoulder: Secondary | ICD-10-CM | POA: Diagnosis not present

## 2021-10-14 DIAGNOSIS — D225 Melanocytic nevi of trunk: Secondary | ICD-10-CM | POA: Diagnosis not present

## 2021-10-14 DIAGNOSIS — L538 Other specified erythematous conditions: Secondary | ICD-10-CM | POA: Diagnosis not present

## 2021-10-14 DIAGNOSIS — X32XXXA Exposure to sunlight, initial encounter: Secondary | ICD-10-CM | POA: Diagnosis not present

## 2021-10-14 DIAGNOSIS — L820 Inflamed seborrheic keratosis: Secondary | ICD-10-CM | POA: Diagnosis not present

## 2021-10-14 DIAGNOSIS — D2272 Melanocytic nevi of left lower limb, including hip: Secondary | ICD-10-CM | POA: Diagnosis not present

## 2021-10-15 ENCOUNTER — Other Ambulatory Visit: Payer: Self-pay

## 2021-10-15 DIAGNOSIS — N138 Other obstructive and reflux uropathy: Secondary | ICD-10-CM

## 2021-10-15 DIAGNOSIS — R7303 Prediabetes: Secondary | ICD-10-CM

## 2021-10-15 DIAGNOSIS — E782 Mixed hyperlipidemia: Secondary | ICD-10-CM

## 2021-10-15 DIAGNOSIS — Z0000 Encounter for general adult medical examination without abnormal findings: Secondary | ICD-10-CM

## 2021-10-15 DIAGNOSIS — I10 Essential (primary) hypertension: Secondary | ICD-10-CM

## 2021-10-18 ENCOUNTER — Other Ambulatory Visit: Payer: Medicare HMO

## 2021-10-18 DIAGNOSIS — Z0000 Encounter for general adult medical examination without abnormal findings: Secondary | ICD-10-CM | POA: Diagnosis not present

## 2021-10-18 DIAGNOSIS — N401 Enlarged prostate with lower urinary tract symptoms: Secondary | ICD-10-CM | POA: Diagnosis not present

## 2021-10-18 DIAGNOSIS — E782 Mixed hyperlipidemia: Secondary | ICD-10-CM | POA: Diagnosis not present

## 2021-10-18 DIAGNOSIS — R7303 Prediabetes: Secondary | ICD-10-CM | POA: Diagnosis not present

## 2021-10-18 DIAGNOSIS — N138 Other obstructive and reflux uropathy: Secondary | ICD-10-CM | POA: Diagnosis not present

## 2021-10-18 DIAGNOSIS — I10 Essential (primary) hypertension: Secondary | ICD-10-CM | POA: Diagnosis not present

## 2021-10-19 LAB — CBC WITH DIFFERENTIAL/PLATELET
Absolute Monocytes: 621 cells/uL (ref 200–950)
Basophils Absolute: 51 cells/uL (ref 0–200)
Basophils Relative: 0.7 %
Eosinophils Absolute: 372 cells/uL (ref 15–500)
Eosinophils Relative: 5.1 %
HCT: 39.7 % (ref 38.5–50.0)
Hemoglobin: 13.3 g/dL (ref 13.2–17.1)
Lymphs Abs: 1730 cells/uL (ref 850–3900)
MCH: 31.6 pg (ref 27.0–33.0)
MCHC: 33.5 g/dL (ref 32.0–36.0)
MCV: 94.3 fL (ref 80.0–100.0)
MPV: 9.7 fL (ref 7.5–12.5)
Monocytes Relative: 8.5 %
Neutro Abs: 4526 cells/uL (ref 1500–7800)
Neutrophils Relative %: 62 %
Platelets: 205 10*3/uL (ref 140–400)
RBC: 4.21 10*6/uL (ref 4.20–5.80)
RDW: 13.7 % (ref 11.0–15.0)
Total Lymphocyte: 23.7 %
WBC: 7.3 10*3/uL (ref 3.8–10.8)

## 2021-10-19 LAB — HEMOGLOBIN A1C
Hgb A1c MFr Bld: 7.2 % of total Hgb — ABNORMAL HIGH (ref <5.7)
Mean Plasma Glucose: 160 mg/dL
eAG (mmol/L): 8.9 mmol/L

## 2021-10-19 LAB — PSA: PSA: 1.32 ng/mL (ref <=4.00)

## 2021-10-19 LAB — COMPLETE METABOLIC PANEL WITH GFR
AG Ratio: 1.7 (calc) (ref 1.0–2.5)
ALT: 23 U/L (ref 9–46)
AST: 18 U/L (ref 10–35)
Albumin: 4.1 g/dL (ref 3.6–5.1)
Alkaline phosphatase (APISO): 113 U/L (ref 35–144)
BUN/Creatinine Ratio: 25 (calc) — ABNORMAL HIGH (ref 6–22)
BUN: 27 mg/dL — ABNORMAL HIGH (ref 7–25)
CO2: 25 mmol/L (ref 20–32)
Calcium: 9.2 mg/dL (ref 8.6–10.3)
Chloride: 105 mmol/L (ref 98–110)
Creat: 1.07 mg/dL (ref 0.70–1.28)
Globulin: 2.4 g/dL (calc) (ref 1.9–3.7)
Glucose, Bld: 152 mg/dL — ABNORMAL HIGH (ref 65–99)
Potassium: 3.4 mmol/L — ABNORMAL LOW (ref 3.5–5.3)
Sodium: 140 mmol/L (ref 135–146)
Total Bilirubin: 0.3 mg/dL (ref 0.2–1.2)
Total Protein: 6.5 g/dL (ref 6.1–8.1)
eGFR: 71 mL/min/{1.73_m2} (ref >=60)

## 2021-10-19 LAB — LIPID PANEL
Cholesterol: 95 mg/dL (ref <200)
HDL: 32 mg/dL — ABNORMAL LOW (ref >=40)
Non-HDL Cholesterol (Calc): 63 mg/dL (calc) (ref <130)
Total CHOL/HDL Ratio: 3 (calc) (ref <5.0)
Triglycerides: 431 mg/dL — ABNORMAL HIGH (ref <150)

## 2021-10-25 ENCOUNTER — Telehealth: Payer: Medicare HMO

## 2021-10-25 ENCOUNTER — Ambulatory Visit (INDEPENDENT_AMBULATORY_CARE_PROVIDER_SITE_OTHER): Payer: Medicare HMO

## 2021-10-25 ENCOUNTER — Ambulatory Visit: Payer: Medicare HMO

## 2021-10-25 ENCOUNTER — Encounter: Payer: Medicare HMO | Admitting: Family Medicine

## 2021-10-25 DIAGNOSIS — M79661 Pain in right lower leg: Secondary | ICD-10-CM

## 2021-10-25 DIAGNOSIS — I10 Essential (primary) hypertension: Secondary | ICD-10-CM

## 2021-10-25 DIAGNOSIS — R7303 Prediabetes: Secondary | ICD-10-CM

## 2021-10-25 DIAGNOSIS — E782 Mixed hyperlipidemia: Secondary | ICD-10-CM

## 2021-10-25 DIAGNOSIS — M7989 Other specified soft tissue disorders: Secondary | ICD-10-CM

## 2021-10-25 NOTE — Chronic Care Management (AMB) (Signed)
Chronic Care Management   CCM RN Visit Note  10/25/2021 Name: Kyle Vargas [employer]. MRN: [id number] DOB: 1941/04/01  Subjective: Kyle Vargas. is a 81 y.o. year old male who is a primary care patient of [name], DO. The care management team was consulted for assistance with disease management and care coordination needs.    Engaged with patient by telephone for follow up visit in response to provider referral for case management and/or care coordination services.   Consent to Services:  The patient was given information about Chronic Care Management services, agreed to services, and gave verbal consent prior to initiation of services.  Please see initial visit note for detailed documentation.   Patient agreed to services and verbal consent obtained.   Assessment: Review of patient past medical history, allergies, medications, health status, including review of consultants reports, laboratory and other test data, was performed as part of comprehensive evaluation and provision of chronic care management services.   SDOH (Social Determinants of Health) assessments and interventions performed:    CCM Care Plan  Allergies  Allergen Reactions   Dust Mite Extract     eye itching   Other Other (See Comments)    Dogs and cats, itching eyes, sneezing     Outpatient Encounter Medications as of 10/25/2021  Medication Sig Note   aspirin 81 MG tablet Take 81 mg by mouth daily. Takes 5 days a week    azelastine (ASTELIN) 0.1 % nasal spray Place 1 spray into both nostrils 2 (two) times daily.     Blood Pressure Monitoring (BLOOD PRESSURE CUFF) MISC 1 Device by Does not apply route daily. Normal upper arm BP cuff    diltiazem (CARDIZEM CD) 240 MG 24 hr capsule Take 1 capsule (240 mg total) by mouth every morning.    Garlic 10 MG CAPS Take by mouth. Unsure of dose    glucose blood test strip ONETOUCH ULTRA BLUE (In Vitro Strip)  1 (one) Strip Strip check blood sugar once daily  for 0 days  Quantity: 50;  Refills: 12   Ordered :02-Oct-2013  [name];  Started 03-Jul-2013 Active 08/09/2014: Received from: [facility]   glucose blood test strip Use as instructed    hydrochlorothiazide (HYDRODIURIL) 25 MG tablet Take 1 tablet (25 mg total) by mouth daily.    ipratropium (ATROVENT) 0.06 % nasal spray Place 2 sprays into both nostrils 4 (four) times daily. For up to 5-7 days then stop. (Patient not taking: Reported on 08/10/2021)    losartan (COZAAR) 100 MG tablet Take 1 tablet (100 mg total) by mouth daily.    metoprolol tartrate (LOPRESSOR) 25 MG tablet Take 1.5 tablets (37.5 mg total) by mouth 2 (two) times daily.    montelukast (SINGULAIR) 10 MG tablet Take 1 tablet (10 mg total) by mouth at bedtime.    Omega-3 Fatty Acids (OMEGA-3 FISH OIL) 1200 MG CAPS Take 1 capsule (1,200 mg total) by mouth 3 (three) times daily. 12/25/2018: 3647m daily    rosuvastatin (CRESTOR) 10 MG tablet Take 1 tablet (10 mg total) by mouth daily.    Sodium Chloride-Sodium Bicarb 1.57 g PACK Place into the nose as needed.  12/25/2018: As needed    tamsulosin (FLOMAX) 0.4 MG CAPS capsule Take 0.4 mg by mouth daily.    No facility-administered encounter medications on file as of 10/25/2021.    Patient Active Problem List   Diagnosis Date Noted   Hyperlipidemia 02/15/2017   BPH with obstruction/lower urinary  tract symptoms 11/07/2016   History of nonmelanoma skin cancer 11/07/2016   Pre-diabetes 10/26/2015   PVC's (premature ventricular contractions)    Essential hypertension     Conditions to be addressed/monitored:HTN, HLD, Pre-DMII, and Chronic pain   Care Plan : RNCM: General Plan of Care (Adult) for Chronic Disease Management and Care Coordination Needs  Updates made by Vanita Ingles, RN since 10/25/2021 12:00 AM  Completed 10/25/2021   Problem: RNCM: Development of Plan of Care for Chronic Disease Management (HTN, HLD, Pre-DM) Resolved 10/25/2021  Priority: High      Long-Range Goal: RNCM: Effective Management  of Plan of Care for Chronic Disease Management (HTN, HLD, Pre-DM) Completed 10/25/2021  Start Date: 06/07/2021  Expected End Date: 06/07/2022  Priority: High  Note:   .Current Barriers: 10-25-2021: The patient has completed the goals of care and the care plan is being closed. The patient knows to contact the [facility] for new concerns or needs. The patient sent educational information on healthy eating and effective management of chronic conditions.  Knowledge Deficits related to plan of care for management of HTN, HLD, and pre-DMII  Chronic Disease Management support and education needs related to HTN, HLD, and pre-DMII  RNCM Clinical Goal(s):  Patient will verbalize basic understanding of HTN, HLD, and pre-DMII disease process and self health management plan as evidenced by keeping appointments, taking medications as directed, dietary restrictions, and working with CCM team to effectively manage chronic diseases  take all medications exactly as prescribed and will call provider for medication related questions as evidenced by compliance with medications and calling for refills before running out of medications    attend all scheduled medical appointments: sees pcp and specialist on a consistent basis  as evidenced by keeping appointments and calling for schedule change needs         demonstrate improved and ongoing health management independence as evidenced by stable VS, weight loss, normal lab values, monitoring dietary habits and no exacerbations in Chronic conditions.         demonstrate ongoing self health care management ability for effective management of chronic conditions  as evidenced by working with the CCM team through collaboration with Consulting civil engineer, provider, and care team.   Interventions: 1:1 collaboration with primary care provider regarding development and update of comprehensive plan of care as evidenced by provider attestation and  co-signature Inter-disciplinary care team collaboration (see longitudinal plan of care) Evaluation of current treatment plan related to  self management and patient's adherence to plan as established by provider   Pre-Diabetes:  (Status: Goal Met.) Long Term Goal  10-25-2021: The patient will see the pcp tomorrow on 10-26-2021. Goals of care met and closing the care plan.   Lab Results  Component Value Date   HGBA1C 7.2 (H) 10/18/2021  Previous in December of 2022 was 6.9 Assessed patient's understanding of A1c goal: <7%. 10-25-2021: The patient has an elevated A1C and states he is concerned. He will see the pcp tomorrow. Education on the goal of A1C less than 7.0. Educational information sent to the patient via email and will also send in my chart and EMMI.  Provided education to patient about basic DM disease process. 06-07-2021: The patient states that he likes to eat and he eats everything. He says he is going to eat. He thinks if he loses about 8 pounds that will help him. He is walking still and on pretty days he walks more. The patient states that he is trying to  cut back on his sodas and carbs. Discussed portion sizes and monitoring dietary intake. The patient denies any acute findings. Is not on medications for pre-DM and is not testing blood sugars. Will continue to monitor for changes. 07-19-2021: Denies any new concerns with pre- DM, has been on medications recently for a sinus infection. Education and support given. 10-25-2021: The patient states he does like to eat. Review of heart healthy/ADA diet and the need to monitor carbs. Discussed options like switching from baked potatoes to sweet potatoes as an alternate option. Education provided on portion sizes and monitoring for changes in dietary  habits. Will provide the patient with healthy eating handout and other information for help in effective management of DM.  Reviewed prescribed diet with patient heart healthy/ADA diet- the patient is  working on cutting back on sodas and carbs. 10-25-2021: Extensive review and education provided.  Counseled on importance of regular laboratory monitoring as prescribed. 10-25-2021: Had recent labs drawn. Is compliant with routine labs         Discussed plans with patient for ongoing care management follow up and provided patient with direct contact information for care management team;      Provided patient with written educational materials related to hypo and hyperglycemia and importance of correct treatment. 10-25-2021: Denies any highs or lows. Checks blood sugars periodically. Review of checking blood sugars if he feels funny or feels different from his usual. Education and support given.;       Reviewed scheduled/upcoming provider appointments including: 10-26-2021        call provider for findings outside established parameters;       Review of patient status, including review of consultants reports, relevant laboratory and other test results, and medications completed;       Advised patient to discuss medication options, weight loss, and other questions or concerns about pre-DM with provider;      Screening for signs and symptoms of depression related to chronic disease state;        Assessed social determinant of health barriers;         Hyperlipidemia:  (Status: Goal Met.) Long Term Goal  10-25-2021: The patients condition is stable and goals of care have been met. Closing care plan at this time.  Lab Results  Component Value Date   CHOL 95 10/18/2021   HDL 32 (L) 10/18/2021   [place]  10/18/2021     Comment:     . LDL cholesterol not calculated. Triglyceride levels greater than 400 mg/dL invalidate calculated LDL results. . Reference range: <100 . Desirable range <100 mg/dL for primary prevention;   <70 mg/dL for patients with CHD or diabetic patients  with > or = 2 CHD risk factors. Marland Kitchen LDL-C is now calculated using the Martin-Hopkins  calculation, which is a validated novel method  providing  better accuracy than the Friedewald equation in the  estimation of LDL-C.  Cresenciano Genre et al. Annamaria Helling. 7915;056(97): 2061-2068  (http://education.[employer].com/faq/FAQ164)    TRIG 431 (H) 10/18/2021   CHOLHDL 3.0 10/18/2021     Medication review performed; medication list updated in electronic medical record. 10-25-2021: The patient is compliant with medications. Takes Crestor 10 mg daily and Omega 3  Provider established cholesterol goals reviewed. 10-25-2021: Review with the patient most recent lab work. The patient states his triglycerides have been out of range for >20 years now and this is nothing new. The patient is mindful of routine blood work and values. Counseled on importance of regular laboratory  monitoring as prescribed. 07-19-2021: The patient has lab work on a consistent basis. New scheduled for June of 2023. 07-25-2021: The patient had recent labs drawn. Will see pcp tomorrow. ; Provided HLD educational materials; Reviewed role and benefits of statin for ASCVD risk reduction; Discussed strategies to manage statin-induced myalgias; Reviewed importance of limiting foods high in cholesterol; Reviewed exercise goals and target of 150 minutes per week;  Hypertension: (Status: Goal Met.) Long Term Goal  10-25-2021: Goals of care met. The patient has stable blood pressures. No new issues related to HTN or Heart health. Last practice recorded BP readings:  BP Readings from Last 3 Encounters:  08/10/21 135/61  07/14/21 (!) 141/69  07/02/21 (!) 128/58  Most recent eGFR/CrCl:  Lab Results  Component Value Date   EGFR 71 10/18/2021    No components found for: CRCL  Evaluation of current treatment plan related to hypertension self management and patient's adherence to plan as established by provider. 07-19-2021: The patient having some elevations in blood pressure recently. Having some pain in his leg and a recent sinus infection in February. Denies any acute findings today.  Monitoring for changes in HTN and heart health. Marland Kitchen 10-25-2021: The patient is having more stable blood pressures at this time. Checks his blood pressures are home on a regular basis. Denies any new concerns with HTN or heart health.    Provided education to patient re: stroke prevention, s/s of heart attack and stroke. 10-25-2021: Educational information sent to the patient by EMMI, My Chart, and Email; Reviewed prescribed diet heart healthy/ADA diet. 10-25-2021: Education and information sent to the patient. Review of healthy eating options and to monitor dietary intake.  Reviewed medications with patient and discussed importance of compliance. 10-25-2021: Is compliant with medications. ;  Discussed plans with patient for ongoing care management follow up and provided patient with direct contact information for care management team; Advised patient, providing education and rationale, to monitor blood pressure daily and record, calling PCP for findings outside established parameters;  Advised patient to discuss HTN changes and heart health concerns with provider; Provided education on prescribed diet heart healthy/ADA diet. 10-25-2021: Review and education provided.;  Discussed complications of poorly controlled blood pressure such as heart disease, stroke, circulatory complications, vision complications, kidney impairment, sexual dysfunction;    Pain:  (Status: Goal Met.) Long Term Goal  10-25-2021: Goals of care have been met. Care plan being closed.  Pain assessment performed. 07-19-2021: The patient states his leg is sore but is getting better. States that he is going to give it another week and if it is not better he will call the pcp. The patient did have a CT and there were no evidence of DVT. The patient knows to call for changes. 10-25-2021: Denies any new pain today. Pain in leg has resolved. Medications reviewed. 07-19-2021: The patient states that he has been putting cold compresses on the area and he  has also been putting cream on the area. The patient denies any acute issues or concerns. 10-25-2021: Is compliant with the plan of care for effective management of pain and discomfort Reviewed provider established plan for pain management; Discussed importance of adherence to all scheduled medical appointments. 07-25-2021: follow up with the pcp on 10-26-2021; Counseled on the importance of reporting any/all new or changed pain symptoms or management strategies to pain management provider; Advised patient to report to care team affect of pain on daily activities; Discussed use of relaxation techniques and/or diversional activities to assist with  pain reduction (distraction, imagery, relaxation, massage, acupressure, TENS, heat, and cold application; Reviewed with patient prescribed pharmacological and nonpharmacological pain relief strategies; Advised patient to discuss unresolved pain or changes in level of intensity of pain with provider;   Patient Goals/Self-Care Activities: Take medications as prescribed   Attend all scheduled provider appointments Call pharmacy for medication refills 3-7 days in advance of running out of medications Attend church or other social activities Perform all self care activities independently  Perform IADL's (shopping, preparing meals, housekeeping, managing finances) independently Call provider office for new concerns or questions  Work with the social worker to address care coordination needs and will continue to work with the clinical team to address health care and disease management related needs call the Suicide and Crisis Lifeline: 988 call the [place] National Suicide Prevention Lifeline: (408)040-0126 or TTY: 7322416274 TTY 731 242 4656) to talk to a trained counselor call 1-800-273-TALK (toll free, 24 hour hotline) if experiencing a Mental Health or [place]  schedule appointment with eye doctor check feet daily for cuts, sores or  redness trim toenails straight across drink 6 to 8 glasses of water each day eat fish at least once per week fill half of plate with vegetables manage portion size prepare main meal at home 3 to 5 days each week read food labels for fat, fiber, carbohydrates and portion size switch to sugar-free drinks keep feet up while sitting wash and dry feet carefully every day wear comfortable, cotton socks wear comfortable, well-fitting shoes check blood pressure weekly choose a place to take my blood pressure (home, clinic or office, retail store) write blood pressure results in a log or diary learn about high blood pressure keep a blood pressure log take blood pressure log to all doctor appointments call doctor for signs and symptoms of high blood pressure develop an action plan for high blood pressure keep all doctor appointments take medications for blood pressure exactly as prescribed report new symptoms to your doctor eat more whole grains, fruits and vegetables, lean meats and healthy fats - call for medicine refill 2 or 3 days before it runs out - take all medications exactly as prescribed - call doctor with any symptoms you believe are related to your medicine - call doctor when you experience any new symptoms - go to all doctor appointments as scheduled - adhere to prescribed diet: heart healthy/ADA       Plan:No further follow up required: the patient has met the plan of care for effective management of chronic conditions. Has the [facility] information if new needs arise. No further follow up needed at this time.   [name], MSN, [place] Mobile: [phone]

## 2021-10-25 NOTE — Patient Instructions (Signed)
Visit Information  Thank you for taking time to visit with me today. Please don't hesitate to contact me if I can be of assistance to you before our next scheduled telephone appointment.  Following are the goals we discussed today:  Pre-Diabetes:  (Status: Goal Met.) Long Term Goal  10-25-2021: The patient will see the pcp tomorrow on 10-26-2021. Goals of care met and closing the care plan.         Lab Results  Component Value Date    HGBA1C 7.2 (H) 10/18/2021  Previous in December of 2022 was 6.9 Assessed patient's understanding of A1c goal: <7%. 10-25-2021: The patient has an elevated A1C and states he is concerned. He will see the pcp tomorrow. Education on the goal of A1C less than 7.0. Educational information sent to the patient via email and will also send in my chart and EMMI.  Provided education to patient about basic DM disease process. 06-07-2021: The patient states that he likes to eat and he eats everything. He says he is going to eat. He thinks if he loses about 8 pounds that will help him. He is walking still and on pretty days he walks more. The patient states that he is trying to cut back on his sodas and carbs. Discussed portion sizes and monitoring dietary intake. The patient denies any acute findings. Is not on medications for pre-DM and is not testing blood sugars. Will continue to monitor for changes. 07-19-2021: Denies any new concerns with pre- DM, has been on medications recently for a sinus infection. Education and support given. 10-25-2021: The patient states he does like to eat. Review of heart healthy/ADA diet and the need to monitor carbs. Discussed options like switching from baked potatoes to sweet potatoes as an alternate option. Education provided on portion sizes and monitoring for changes in dietary  habits. Will provide the patient with healthy eating handout and other information for help in effective management of DM.  Reviewed prescribed diet with patient heart  healthy/ADA diet- the patient is working on cutting back on sodas and carbs. 10-25-2021: Extensive review and education provided.  Counseled on importance of regular laboratory monitoring as prescribed. 10-25-2021: Had recent labs drawn. Is compliant with routine labs         Discussed plans with patient for ongoing care management follow up and provided patient with direct contact information for care management team;      Provided patient with written educational materials related to hypo and hyperglycemia and importance of correct treatment. 10-25-2021: Denies any highs or lows. Checks blood sugars periodically. Review of checking blood sugars if he feels funny or feels different from his usual. Education and support given.;       Reviewed scheduled/upcoming provider appointments including: 10-26-2021        call provider for findings outside established parameters;       Review of patient status, including review of consultants reports, relevant laboratory and other test results, and medications completed;       Advised patient to discuss medication options, weight loss, and other questions or concerns about pre-DM with provider;      Screening for signs and symptoms of depression related to chronic disease state;        Assessed social determinant of health barriers;          Hyperlipidemia:  (Status: Goal Met.) Long Term Goal  10-25-2021: The patients condition is stable and goals of care have been met. Closing care plan at this  time.         Lab Results  Component Value Date    CHOL 95 10/18/2021    HDL 32 (L) 10/18/2021    [place]   10/18/2021        Comment:        . LDL cholesterol not calculated. Triglyceride levels greater than 400 mg/dL invalidate calculated LDL results. . Reference range: <100 . Desirable range <100 mg/dL for primary prevention;   <70 mg/dL for patients with CHD or diabetic patients  with > or = 2 CHD risk factors. Marland Kitchen LDL-C is now calculated using the  Martin-Hopkins  calculation, which is a validated novel method providing  better accuracy than the Friedewald equation in the  estimation of LDL-C.  Cresenciano Genre et al. Annamaria Helling. 2094;709(62): 2061-2068  (http://education.[facility].com/faq/FAQ164)      TRIG 431 (H) 10/18/2021    CHOLHDL 3.0 10/18/2021      Medication review performed; medication list updated in electronic medical record. 10-25-2021: The patient is compliant with medications. Takes Crestor 10 mg daily and Omega 3  Provider established cholesterol goals reviewed. 10-25-2021: Review with the patient most recent lab work. The patient states his triglycerides have been out of range for >20 years now and this is nothing new. The patient is mindful of routine blood work and values. Counseled on importance of regular laboratory monitoring as prescribed. 07-19-2021: The patient has lab work on a consistent basis. New scheduled for June of 2023. 07-25-2021: The patient had recent labs drawn. Will see pcp tomorrow. ; Provided HLD educational materials; Reviewed role and benefits of statin for ASCVD risk reduction; Discussed strategies to manage statin-induced myalgias; Reviewed importance of limiting foods high in cholesterol; Reviewed exercise goals and target of 150 minutes per week;   Hypertension: (Status: Goal Met.) Long Term Goal  10-25-2021: Goals of care met. The patient has stable blood pressures. No new issues related to HTN or Heart health. Last practice recorded BP readings:     BP Readings from Last 3 Encounters:  08/10/21 135/61  07/14/21 (!) 141/69  07/02/21 (!) 128/58  Most recent eGFR/CrCl:       Lab Results  Component Value Date    EGFR 71 10/18/2021    No components found for: CRCL   Evaluation of current treatment plan related to hypertension self management and patient's adherence to plan as established by provider. 07-19-2021: The patient having some elevations in blood pressure recently. Having some pain in  his leg and a recent sinus infection in February. Denies any acute findings today. Monitoring for changes in HTN and heart health. Marland Kitchen 10-25-2021: The patient is having more stable blood pressures at this time. Checks his blood pressures are home on a regular basis. Denies any new concerns with HTN or heart health.    Provided education to patient re: stroke prevention, s/s of heart attack and stroke. 10-25-2021: Educational information sent to the patient by EMMI, My Chart, and Email; Reviewed prescribed diet heart healthy/ADA diet. 10-25-2021: Education and information sent to the patient. Review of healthy eating options and to monitor dietary intake.  Reviewed medications with patient and discussed importance of compliance. 10-25-2021: Is compliant with medications. ;  Discussed plans with patient for ongoing care management follow up and provided patient with direct contact information for care management team; Advised patient, providing education and rationale, to monitor blood pressure daily and record, calling PCP for findings outside established parameters;  Advised patient to discuss HTN changes and heart health concerns  with provider; Provided education on prescribed diet heart healthy/ADA diet. 10-25-2021: Review and education provided.;  Discussed complications of poorly controlled blood pressure such as heart disease, stroke, circulatory complications, vision complications, kidney impairment, sexual dysfunction;      Pain:  (Status: Goal Met.) Long Term Goal  10-25-2021: Goals of care have been met. Care plan being closed.  Pain assessment performed. 07-19-2021: The patient states his leg is sore but is getting better. States that he is going to give it another week and if it is not better he will call the pcp. The patient did have a CT and there were no evidence of DVT. The patient knows to call for changes. 10-25-2021: Denies any new pain today. Pain in leg has resolved. Medications reviewed.  07-19-2021: The patient states that he has been putting cold compresses on the area and he has also been putting cream on the area. The patient denies any acute issues or concerns. 10-25-2021: Is compliant with the plan of care for effective management of pain and discomfort Reviewed provider established plan for pain management; Discussed importance of adherence to all scheduled medical appointments. 07-25-2021: follow up with the pcp on 10-26-2021; Counseled on the importance of reporting any/all new or changed pain symptoms or management strategies to pain management provider; Advised patient to report to care team affect of pain on daily activities; Discussed use of relaxation techniques and/or diversional activities to assist with pain reduction (distraction, imagery, relaxation, massage, acupressure, TENS, heat, and cold application; Reviewed with patient prescribed pharmacological and nonpharmacological pain relief strategies; Advised patient to discuss unresolved pain or changes in level of intensity of pain with provider;     No further follow up needed at this time. The patient has met the plan of care and goals have been met. Care plan closed. Patient knows to call for new changes or needs  Please call the care guide team at [phone] if you need to cancel or reschedule your appointment.   If you are experiencing a Mental Health or [place] or need someone to talk to, please call the Suicide and Crisis Lifeline: 988 call the [place] National Suicide Prevention Lifeline: 2790803150 or TTY: 601-257-1870 TTY 208-880-2348) to talk to a trained counselor call 1-800-273-TALK (toll free, 24 hour hotline)   Patient verbalizes understanding of instructions and care plan provided today and agrees to view in [place]. Active MyChart status and patient understanding of how to access instructions and care plan via MyChart confirmed with patient.     [name], MSN, [place] Mobile: [phone]   High Triglycerides Eating Plan Triglycerides are a type of fat in the blood. High levels of triglycerides can increase your risk of heart disease and stroke. If your triglyceride levels are high, choosing the right foods can help lower your triglycerides and keep your heart healthy. Work with your health care provider or a dietitian to develop an eating plan that is right for you. What are tips for following this plan? General guidelines  Lose weight, if you are overweight. For most people, losing 5-10 lb (2-5 kg) helps lower triglyceride levels. A weight-loss plan may include: 30 minutes of exercise at least 5 days a week. Reducing the amount of calories, sugar, and fat you eat. Eat a wide variety of fresh fruits, vegetables, and whole grains. These foods are high in fiber. Eat foods that contain healthy fats, such as fatty  fish, nuts, seeds, and olive oil. Avoid foods that are high in added sugar, added salt (sodium), and saturated fat. Avoid low-fiber, refined carbohydrates such as white bread, crackers, noodles, and white rice. Avoid foods with trans fats or partially hydrogenated oils, such as fried foods or stick margarine. If you drink alcohol: Limit how much you have to: 0-1 drink a day for women who are not pregnant. 0-2 drinks a day for men. Your health care provider may recommend that you drink less than these amounts depending on your overall health. Know how much alcohol is in a drink. In the [place] one drink equals one 12 oz bottle of beer (355 mL), one 5 oz glass of wine (148 mL), or one 1 oz glass of hard liquor (44 mL). Reading food labels Check food labels for: The amount of saturated fat. Choose foods with no or very little saturated fat (less than 2 g). The amount of trans fat. Choose foods with no transfat. The amount of cholesterol. Choose foods that are low in  cholesterol. The amount of sodium. Choose foods with less than 140 milligrams (mg) per serving. Shopping Buy dairy products labeled as nonfat (skim) or low-fat (1%). Avoid buying processed or prepackaged foods. These are often high in added sugar, sodium, and fat. Cooking Choose healthy fats when cooking, such as olive oil, avocado oil, or canola oil. Cook foods using lower fat methods, such as baking, broiling, boiling, or grilling. Make your own sauces, dressings, and marinades when possible, instead of buying them. Store-bought sauces, dressings, and marinades are often high in sodium and sugar. Meal planning Eat more home-cooked food and less restaurant, buffet, and fast food. Eat fatty fish at least 2 times each week. Examples of fatty fish include salmon, trout, sardines, mackerel, tuna, and herring. If you eat whole eggs, do not eat more than 4 egg yolks per week. What foods should I eat? Fruits All fresh, canned (in natural juice), or frozen fruits. Vegetables Fresh or frozen vegetables. Low-sodium canned vegetables. Grains Whole wheat or whole grain breads, crackers, cereals, and pasta. Unsweetened oatmeal. Bulgur. Barley. Quinoa. Brown rice. Whole wheat flour tortillas. Meats and other proteins Skinless chicken or [place]. Ground chicken or [place]. Lean cuts of pork, trimmed of fat. Fish and seafood, especially salmon, trout, and herring. Egg whites. Dried beans, peas, or lentils. Unsalted nuts or seeds. Unsalted canned beans. Natural peanut or almond butter or other nut butters. Dairy Low-fat dairy products. Skim or low-fat (1%) milk. Reduced fat (2%) and low-sodium cheese. Low-fat ricotta cheese. Low-fat cottage cheese. Plain, low-fat yogurt. Fats and oils Tub margarine without trans fats. Light or reduced-fat mayonnaise. Light or reduced-fat salad dressings. Avocado. Safflower, olive, sunflower, soybean, and canola oils. The items listed above may not be a complete list of  recommended foods and beverages. Talk with your dietitian about what dietary choices are best for you. What foods should I avoid? Fruits Sweetened dried fruit. Canned fruit in syrup. Fruit juice. Vegetables Creamed or fried vegetables. Vegetables in a cheese sauce. Grains White bread. White (regular) pasta. White rice. Cornbread. Bagels. Pastries. Crackers that contain trans fat. Meats and other proteins Fatty cuts of meat. Ribs. Chicken wings. Berniece Salines. Sausage. Bologna. Salami. Chitterlings. Fatback. Hot dogs. Bratwurst. Packaged lunch meats. Dairy Whole or reduced-fat (2%) milk. Half-and-half. Cream cheese. Full-fat or sweetened yogurt. Full-fat cheese. Nondairy creamers. Whipped toppings. Processed cheese or cheese spreads. Cheese curds. Fats and oils Butter. Stick margarine. Lard. Shortening. Ghee. Bacon fat. Tropical oils, such as  coconut, palm kernel, or palm oils. Beverages Alcohol. Sweetened drinks, such as soda, lemonade, fruit drinks, or punches. Sweets and desserts Corn syrup. Sugars. Honey. Molasses. Candy. Jam and jelly. Syrup. Sweetened cereals. Cookies. Pies. Cakes. Donuts. Muffins. Ice cream. Condiments Store-bought sauces, dressings, and marinades that are high in sugar, such as ketchup and barbecue sauce. The items listed above may not be a complete list of foods and beverages you should avoid. Talk with your dietitian about what dietary choices are best for you. Summary High levels of triglycerides can increase the risk of heart disease and stroke. Choosing the right foods can help lower your triglycerides. Eat plenty of fresh fruits, vegetables, and whole grains. Choose low-fat dairy and lean meats. Eat fatty fish at least twice a week. Avoid processed and prepackaged foods with added sugar, sodium, saturated fat, and trans fat. If you need suggestions or have questions about what types of food are good for you, talk with your health care provider or a dietitian. This  information is not intended to replace advice given to you by your health care provider. Make sure you discuss any questions you have with your health care provider. Document Revised: 09/04/2020 Document Reviewed: 09/04/2020 Elsevier Patient Education  [place]. Diabetes Mellitus and Nutrition, Adult When you have diabetes, or diabetes mellitus, it is very important to have healthy eating habits because your blood sugar (glucose) levels are greatly affected by what you eat and drink. Eating healthy foods in the right amounts, at about the same times every day, can help you: Manage your blood glucose. Lower your risk of heart disease. Improve your blood pressure. Reach or maintain a healthy weight. What can affect my meal plan? Every person with diabetes is different, and each person has different needs for a meal plan. Your health care provider may recommend that you work with a dietitian to make a meal plan that is best for you. Your meal plan may vary depending on factors such as: The calories you need. The medicines you take. Your weight. Your blood glucose, blood pressure, and cholesterol levels. Your activity level. Other health conditions you have, such as heart or kidney disease. How do carbohydrates affect me? Carbohydrates, also called carbs, affect your blood glucose level more than any other type of food. Eating carbs raises the amount of glucose in your blood. It is important to know how many carbs you can safely have in each meal. This is different for every person. Your dietitian can help you calculate how many carbs you should have at each meal and for each snack. How does alcohol affect me? Alcohol can cause a decrease in blood glucose (hypoglycemia), especially if you use insulin or take certain diabetes medicines by mouth. Hypoglycemia can be a life-threatening condition. Symptoms of hypoglycemia, such as sleepiness, dizziness, and confusion, are similar to symptoms  of having too much alcohol. Do not drink alcohol if: Your health care provider tells you not to drink. You are pregnant, may be pregnant, or are planning to become pregnant. If you drink alcohol: Limit how much you have to: 0-1 drink a day for women. 0-2 drinks a day for men. Know how much alcohol is in your drink. In the [place].S., one drink equals one 12 oz bottle of beer (355 mL), one 5 oz glass of wine (148 mL), or one 1 oz glass of hard liquor (44 mL). Keep yourself hydrated with water, diet soda, or unsweetened iced tea. Keep in mind that regular soda,  juice, and other mixers may contain a lot of sugar and must be counted as carbs. What are tips for following this plan?  Reading food labels Start by checking the serving size on the Nutrition Facts label of packaged foods and drinks. The number of calories and the amount of carbs, fats, and other nutrients listed on the label are based on one serving of the item. Many items contain more than one serving per package. Check the total grams (g) of carbs in one serving. Check the number of grams of saturated fats and trans fats in one serving. Choose foods that have a low amount or none of these fats. Check the number of milligrams (mg) of salt (sodium) in one serving. Most people should limit total sodium intake to less than 2,300 mg per day. Always check the nutrition information of foods labeled as "low-fat" or "nonfat." These foods may be higher in added sugar or refined carbs and should be avoided. Talk to your dietitian to identify your daily goals for nutrients listed on the label. Shopping Avoid buying canned, pre-made, or processed foods. These foods tend to be high in fat, sodium, and added sugar. Shop around the outside edge of the grocery store. This is where you will most often find fresh fruits and vegetables, bulk grains, fresh meats, and fresh dairy products. Cooking Use low-heat cooking methods, such as baking, instead of  high-heat cooking methods, such as deep frying. Cook using healthy oils, such as olive, canola, or sunflower oil. Avoid cooking with butter, cream, or high-fat meats. Meal planning Eat meals and snacks regularly, preferably at the same times every day. Avoid going long periods of time without eating. Eat foods that are high in fiber, such as fresh fruits, vegetables, beans, and whole grains. Eat 4-6 oz (112-168 g) of lean protein each day, such as lean meat, chicken, fish, eggs, or tofu. One ounce (oz) (28 g) of lean protein is equal to: 1 oz (28 g) of meat, chicken, or fish. 1 egg.  cup (62 g) of tofu. Eat some foods each day that contain healthy fats, such as avocado, nuts, seeds, and fish. What foods should I eat? Fruits Berries. Apples. Oranges. Peaches. Apricots. Plums. Grapes. Mangoes. Papayas. Pomegranates. Kiwi. Cherries. Vegetables Leafy greens, including lettuce, spinach, kale, chard, collard greens, mustard greens, and cabbage. Beets. Cauliflower. Broccoli. Carrots. Green beans. Tomatoes. Peppers. Onions. Cucumbers. Brussels sprouts. Grains Whole grains, such as whole-wheat or whole-grain bread, crackers, tortillas, cereal, and pasta. Unsweetened oatmeal. Quinoa. Brown or wild rice. Meats and other proteins Seafood. Poultry without skin. Lean cuts of poultry and beef. Tofu. Nuts. Seeds. Dairy Low-fat or fat-free dairy products such as milk, yogurt, and cheese. The items listed above may not be a complete list of foods and beverages you can eat and drink. Contact a dietitian for more information. What foods should I avoid? Fruits Fruits canned with syrup. Vegetables Canned vegetables. Frozen vegetables with butter or cream sauce. Grains Refined white flour and flour products such as bread, pasta, snack foods, and cereals. Avoid all processed foods. Meats and other proteins Fatty cuts of meat. Poultry with skin. Breaded or fried meats. Processed meat. Avoid saturated  fats. Dairy Full-fat yogurt, cheese, or milk. Beverages Sweetened drinks, such as soda or iced tea. The items listed above may not be a complete list of foods and beverages you should avoid. Contact a dietitian for more information. Questions to ask a health care provider Do I need to meet with a certified  diabetes care and education specialist? Do I need to meet with a dietitian? What number can I call if I have questions? When are the best times to check my blood glucose? Where to find more information: American Diabetes Association: diabetes.org Academy of Nutrition and Dietetics: eatright.[employer] of Diabetes and Digestive and Kidney Diseases: [employer].is Association of Diabetes Care & Education Specialists: diabeteseducator.org Summary It is important to have healthy eating habits because your blood sugar (glucose) levels are greatly affected by what you eat and drink. It is important to use alcohol carefully. A healthy meal plan will help you manage your blood glucose and lower your risk of heart disease. Your health care provider may recommend that you work with a dietitian to make a meal plan that is best for you. This information is not intended to replace advice given to you by your health care provider. Make sure you discuss any questions you have with your health care provider. Document Revised: 11/27/2019 Document Reviewed: 11/27/2019 Elsevier Patient Education  [place] Eating Plan DASH stands for Dietary Approaches to Stop Hypertension. The DASH eating plan is a healthy eating plan that has been shown to: Reduce high blood pressure (hypertension). Reduce your risk for type 2 diabetes, heart disease, and stroke. Help with weight loss. What are tips for following this plan? Reading food labels Check food labels for the amount of salt (sodium) per serving. Choose foods with less than 5 percent of the Daily Value of sodium. Generally, foods  with less than 300 milligrams (mg) of sodium per serving fit into this eating plan. To find whole grains, look for the word "whole" as the first word in the ingredient list. Shopping Buy products labeled as "low-sodium" or "no salt added." Buy fresh foods. Avoid canned foods and pre-made or frozen meals. Cooking Avoid adding salt when cooking. Use salt-free seasonings or herbs instead of table salt or sea salt. Check with your health care provider or pharmacist before using salt substitutes. Do not fry foods. Cook foods using healthy methods such as baking, boiling, grilling, roasting, and broiling instead. Cook with heart-healthy oils, such as olive, canola, avocado, soybean, or sunflower oil. Meal planning  Eat a balanced diet that includes: 4 or more servings of fruits and 4 or more servings of vegetables each day. Try to fill one-half of your plate with fruits and vegetables. 6-8 servings of whole grains each day. Less than 6 oz (170 g) of lean meat, poultry, or fish each day. A 3-oz (85-g) serving of meat is about the same size as a deck of cards. One egg equals 1 oz (28 g). 2-3 servings of low-fat dairy each day. One serving is 1 cup (237 mL). 1 serving of nuts, seeds, or beans 5 times each week. 2-3 servings of heart-healthy fats. Healthy fats called omega-3 fatty acids are found in foods such as walnuts, flaxseeds, fortified milks, and eggs. These fats are also found in cold-water fish, such as sardines, salmon, and mackerel. Limit how much you eat of: Canned or prepackaged foods. Food that is high in trans fat, such as some fried foods. Food that is high in saturated fat, such as fatty meat. Desserts and other sweets, sugary drinks, and other foods with added sugar. Full-fat dairy products. Do not salt foods before eating. Do not eat more than 4 egg yolks a week. Try to eat at least 2 vegetarian meals a week. Eat more home-cooked food and less restaurant, buffet, and fast  food. Lifestyle When eating at a restaurant, ask that your food be prepared with less salt or no salt, if possible. If you drink alcohol: Limit how much you use to: 0-1 drink a day for women who are not pregnant. 0-2 drinks a day for men. Be aware of how much alcohol is in your drink. In the [place] one drink equals one 12 oz bottle of beer (355 mL), one 5 oz glass of wine (148 mL), or one 1 oz glass of hard liquor (44 mL). General information Avoid eating more than 2,300 mg of salt a day. If you have hypertension, you may need to reduce your sodium intake to 1,500 mg a day. Work with your health care provider to maintain a healthy body weight or to lose weight. Ask what an ideal weight is for you. Get at least 30 minutes of exercise that causes your heart to beat faster (aerobic exercise) most days of the week. Activities may include walking, swimming, or biking. Work with your health care provider or dietitian to adjust your eating plan to your individual calorie needs. What foods should I eat? Fruits All fresh, dried, or frozen fruit. Canned fruit in natural juice (without added sugar). Vegetables Fresh or frozen vegetables (raw, steamed, roasted, or grilled). Low-sodium or reduced-sodium tomato and vegetable juice. Low-sodium or reduced-sodium tomato sauce and tomato paste. Low-sodium or reduced-sodium canned vegetables. Grains Whole-grain or whole-wheat bread. Whole-grain or whole-wheat pasta. Brown rice. [place]. Bulgur. Whole-grain and low-sodium cereals. Pita bread. Low-fat, low-sodium crackers. Whole-wheat flour tortillas. Meats and other proteins Skinless chicken or [place]. Ground chicken or [place]. Pork with fat trimmed off. Fish and seafood. Egg whites. Dried beans, peas, or lentils. Unsalted nuts, nut butters, and seeds. Unsalted canned beans. Lean cuts of beef with fat trimmed off. Low-sodium, lean precooked or cured meat, such as sausages or meat loaves. Dairy Low-fat  (1%) or fat-free (skim) milk. Reduced-fat, low-fat, or fat-free cheeses. Nonfat, low-sodium ricotta or cottage cheese. Low-fat or nonfat yogurt. Low-fat, low-sodium cheese. Fats and oils Soft margarine without trans fats. Vegetable oil. Reduced-fat, low-fat, or light mayonnaise and salad dressings (reduced-sodium). Canola, safflower, olive, avocado, soybean, and sunflower oils. Avocado. Seasonings and condiments Herbs. Spices. Seasoning mixes without salt. Other foods Unsalted popcorn and pretzels. Fat-free sweets. The items listed above may not be a complete list of foods and beverages you can eat. Contact a dietitian for more information. What foods should I avoid? Fruits Canned fruit in a light or heavy syrup. Fried fruit. Fruit in cream or butter sauce. Vegetables Creamed or fried vegetables. Vegetables in a cheese sauce. Regular canned vegetables (not low-sodium or reduced-sodium). Regular canned tomato sauce and paste (not low-sodium or reduced-sodium). Regular tomato and vegetable juice (not low-sodium or reduced-sodium). Angie Fava. Olives. Grains Baked goods made with fat, such as croissants, muffins, or some breads. Dry pasta or rice meal packs. Meats and other proteins Fatty cuts of meat. Ribs. Fried meat. Berniece Salines. Bologna, salami, and other precooked or cured meats, such as sausages or meat loaves. Fat from the back of a pig (fatback). Bratwurst. Salted nuts and seeds. Canned beans with added salt. Canned or smoked fish. Whole eggs or egg yolks. Chicken or [place] with skin. Dairy Whole or 2% milk, cream, and half-and-half. Whole or full-fat cream cheese. Whole-fat or sweetened yogurt. Full-fat cheese. Nondairy creamers. Whipped toppings. Processed cheese and cheese spreads. Fats and oils Butter. Stick margarine. Lard. Shortening. Ghee. Bacon fat. Tropical oils, such as coconut, palm kernel, or palm oil.  Seasonings and condiments Onion salt, garlic salt, seasoned salt, table salt, and sea  salt. Worcestershire sauce. Tartar sauce. Barbecue sauce. Teriyaki sauce. Soy sauce, including reduced-sodium. Steak sauce. Canned and packaged gravies. Fish sauce. Oyster sauce. Cocktail sauce. Store-bought horseradish. Ketchup. Mustard. Meat flavorings and tenderizers. Bouillon cubes. Hot sauces. Pre-made or packaged marinades. Pre-made or packaged taco seasonings. Relishes. Regular salad dressings. Other foods Salted popcorn and pretzels. The items listed above may not be a complete list of foods and beverages you should avoid. Contact a dietitian for more information. Where to find more information National Heart, Lung, and Blood Institute: https://wilson-eaton.com/ American Heart Association: www.heart.org Academy of Nutrition and Dietetics: www.eatright.[place]: www.kidney.org Summary The DASH eating plan is a healthy eating plan that has been shown to reduce high blood pressure (hypertension). It may also reduce your risk for type 2 diabetes, heart disease, and stroke. When on the DASH eating plan, aim to eat more fresh fruits and vegetables, whole grains, lean proteins, low-fat dairy, and heart-healthy fats. With the DASH eating plan, you should limit salt (sodium) intake to 2,300 mg a day. If you have hypertension, you may need to reduce your sodium intake to 1,500 mg a day. Work with your health care provider or dietitian to adjust your eating plan to your individual calorie needs. This information is not intended to replace advice given to you by your health care provider. Make sure you discuss any questions you have with your health care provider. Document Revised: 03/29/2019 Document Reviewed: 03/29/2019 Elsevier Patient Education  [place].

## 2021-10-26 ENCOUNTER — Encounter: Payer: Self-pay | Admitting: Family Medicine

## 2021-10-26 ENCOUNTER — Ambulatory Visit (INDEPENDENT_AMBULATORY_CARE_PROVIDER_SITE_OTHER): Payer: Medicare HMO | Admitting: Family Medicine

## 2021-10-26 VITALS — BP 133/56 | HR 65 | Ht 68.5 in | Wt 186.6 lb

## 2021-10-26 DIAGNOSIS — E1169 Type 2 diabetes mellitus with other specified complication: Secondary | ICD-10-CM

## 2021-10-26 DIAGNOSIS — I10 Essential (primary) hypertension: Secondary | ICD-10-CM

## 2021-10-26 DIAGNOSIS — N401 Enlarged prostate with lower urinary tract symptoms: Secondary | ICD-10-CM | POA: Diagnosis not present

## 2021-10-26 DIAGNOSIS — Z0000 Encounter for general adult medical examination without abnormal findings: Secondary | ICD-10-CM | POA: Diagnosis not present

## 2021-10-26 DIAGNOSIS — N138 Other obstructive and reflux uropathy: Secondary | ICD-10-CM

## 2021-10-26 DIAGNOSIS — E785 Hyperlipidemia, unspecified: Secondary | ICD-10-CM | POA: Diagnosis not present

## 2021-10-26 NOTE — Assessment & Plan Note (Signed)
Stable without change in LUTS Not on therapy rx Followed by [place] u[facility] Dr Maryan Puls  PSA negative, and recently done at Urologist as well negative

## 2021-10-26 NOTE — Assessment & Plan Note (Signed)
Controlled BP Home readings reviewed No known complications except history of PVCs Followed by Cardiology   Plan:  1. Continue current BP regimen - Losartan '100mg'$ , HCTZ '25mg'$ , Metoprolol 37.'5mg'$  BID (1.5 tabs of '25mg'$ ), Diltiazem '240mg'$  2. Encourage improved lifestyle - low sodium diet, continue regular exercise 3. Continue monitor BP outside office, bring readings to next visit, if persistently >140/90 or new symptoms notify office sooner

## 2021-10-26 NOTE — Progress Notes (Signed)
Subjective:    Patient ID: Kyle Kung., male    DOB: 06-Feb-1941, 81 y.o.   MRN: [id number]  Kyle Vargas. is a 81 y.o. male presenting on 10/26/2021 for Annual Exam   HPI  Here for Annual Physical and Lab Review.  Type 2, Diet Controlled Diabetes Lab showed elevated A1c 7.2 prior 6.9, in range of diabetes Previously dx pre diabetes CBGs: Avg 100-120, Low none, High none. Checks CBGs x 2-3 weeks Meds: Never Currently on ARB Lifestyle: - Diet (improved low carb) - Exercise (walks 30 min daily 5 x weekly, works in garden, yard, active at home) - Diabetic Eye Exam from Dr Ellin Mayhew, he has floater in middle of his left eye. Denies hypoglycemia   CHRONIC HTN / Intermittent PVC - controlled. [facility] Dr Fletcher Anon. Prior work up they checked Renal Artery [place] due to spikes or elevation in BP, results show normal without stenosis. Home BP improved Current Meds - Losartan 163m, HCTZ 237m Metoprolol 37.60m560mwice daily (260m460mbs, 1.5 per dose), Diltiazem-24 hour 240mg34morts good compliance, took meds today. Tolerating well, w/o complaints. - Taking ASA 81mg 33mdays weekly, skips weekends. No known CAD or CVA - Followed by Dr Arida Fletcher Anonology for PVC in past, controlled on medicines   HYPERLIPIDEMIA: - Reports concerns with chronic elevated TG for reportedly >20 years and fam history of same. Last lipid panel 10/2021, improved TG on Statin - Currently taking Rosuvastatin 10mg n360mly, Fish Oil Omega 3, 1200 - taking x 3 a day - he has not been on Statin or Fibrate in past by his report - he has been taking Garlique supplement OTC and also [facility] for squamous cell skin cancer    Health Maintenance: UTD COVID Vaccine.  PSA 1.32 (10/2021) also note 3 months ago March 2023 PSA 1.9 (checked by Dr Wolff UYves Dilly)     10/26/2021    1:37 PM 08/10/2021    1:54 PM 07/14/2021    9:45 AM  Depression screen PHQ 2/9   Decreased Interest 0 0 0  Down, Depressed, Hopeless 0 0 0  PHQ - 2 Score 0 0 0  Altered sleeping  0 0  Tired, decreased energy  0 0  Change in appetite  0 0  Feeling bad or failure about yourself   0 0  Trouble concentrating  0 0  Moving slowly or fidgety/restless  0 0  Suicidal thoughts  0 0  PHQ-9 Score  0 0  Difficult doing work/chores  Not difficult at all Not difficult at all    Past Medical History:  Diagnosis Date   Allergic rhinitis, cause unspecified    Diarrhea    Dysrhythmia    Esophageal reflux    H/O diverticulitis of colon    History of kidney stones    PVC's (premature ventricular contractions)    Right ventricular outflow tract   Past Surgical History:  Procedure Laterality Date   BASAL CELL CARCINOMA EXCISION     CATARACT EXTRACTION, BILATERAL  2020   COLONOSCOPY     COLONOSCOPY WITH PROPOFOL N/A 02/08/2017   Procedure: COLONOSCOPY WITH PROPOFOL;  Surgeon[facility]a [facility];  Service: Endoscopy;  Laterality: N/A;   REPAIR IMPERFORATE ANUS / ANORECTOPLASTY     TRANSURETHRAL RESECTION OF PROSTATE     Social History   Socioeconomic History   Marital status: Married    Spouse  name: Millcent Tumbleson   Number of children: 3   Years of education: Not on file   Highest education level: Some college, no degree  Occupational History   Occupation: retired  Tobacco Use   Smoking status: Never   Smokeless tobacco: Never  Vaping Use   Vaping Use: Never used  Substance and Sexual Activity   Alcohol use: Yes    Comment: occasionally  2 glasses of wine a month    Drug use: No   Sexual activity: Not on file  Other Topics Concern   Not on file  Social History Narrative   Not on file   Social Determinants of Health   Financial Resource Strain: Low Risk  (08/10/2021)   Overall Financial Resource Strain (CARDIA)    Difficulty of Paying Living Expenses: Not hard at all  Food Insecurity: No Food Insecurity (08/10/2021)   Hunger  Vital Sign    Worried About Running Out of Food in the Last Year: Never true    [place] in the Last Year: Never true  Transportation Needs: No Transportation Needs (08/10/2021)   PRAPARE - Hydrologist (Medical): No    Lack of Transportation (Non-Medical): No  Physical Activity: Sufficiently Active (02/22/2021)   Exercise Vital Sign    Days of Exercise per Week: 5 days    Minutes of Exercise per Session: 30 min  Stress: No Stress Concern Present (02/22/2021)   [place]    Feeling of Stress : Not at all  Social Connections: [place] (08/10/2021)   Social Connection and Isolation Panel [NHANES]    Frequency of Communication with Friends and Family: More than three times a week    Frequency of Social Gatherings with Friends and Family: More than three times a week    Attends Religious Services: More than 4 times per year    Active Member of [employer] or Organizations: Yes    Attends Archivist Meetings: Never    Marital Status: Married  Human resources officer Violence: Not At Risk (02/22/2021)   Humiliation, Afraid, Rape, and Kick questionnaire    Fear of Current or Ex-Partner: No    Emotionally Abused: No    Physically Abused: No    Sexually Abused: No   Family History  Problem Relation Age of Onset   Hypertension Father    Current Outpatient Medications on File Prior to Visit  Medication Sig   aspirin 81 MG tablet Take 81 mg by mouth daily. Takes 5 days a week   azelastine (ASTELIN) 0.1 % nasal spray Place 1 spray into both nostrils 2 (two) times daily.    Blood Pressure Monitoring (BLOOD PRESSURE CUFF) MISC 1 Device by Does not apply route daily. Normal upper arm BP cuff   diltiazem (CARDIZEM CD) 240 MG 24 hr capsule Take 1 capsule (240 mg total) by mouth every morning.   Garlic 10 MG CAPS Take by mouth. Unsure of dose   glucose blood test strip ONETOUCH ULTRA BLUE  (In Vitro Strip)  1 (one) Strip Strip check blood sugar once daily for 0 days  Quantity: 50;  Refills: 12   Ordered :02-Oct-2013  [name];  Started 03-Jul-2013 Active   glucose blood test strip Use as instructed   losartan (COZAAR) 100 MG tablet Take 1 tablet (100 mg total) by mouth daily.   metoprolol tartrate (LOPRESSOR) 25 MG tablet Take 1.5 tablets (37.5 mg total) by mouth  2 (two) times daily.   montelukast (SINGULAIR) 10 MG tablet Take 1 tablet (10 mg total) by mouth at bedtime.   Omega-3 Fatty Acids (OMEGA-3 FISH OIL) 1200 MG CAPS Take 1 capsule (1,200 mg total) by mouth 3 (three) times daily.   Sodium Chloride-Sodium Bicarb 1.57 g PACK Place into the nose as needed.    tamsulosin (FLOMAX) 0.4 MG CAPS capsule Take 0.4 mg by mouth daily.   hydrochlorothiazide (HYDRODIURIL) 25 MG tablet Take 1 tablet (25 mg total) by mouth daily.   ipratropium (ATROVENT) 0.06 % nasal spray Place 2 sprays into both nostrils 4 (four) times daily. For up to 5-7 days then stop. (Patient not taking: Reported on 08/10/2021)   rosuvastatin (CRESTOR) 10 MG tablet Take 1 tablet (10 mg total) by mouth daily.   No current facility-administered medications on file prior to visit.    Review of Systems  Constitutional:  Negative for activity change, appetite change, chills, diaphoresis, fatigue and fever.  HENT:  Negative for congestion and hearing loss.   Eyes:  Negative for visual disturbance.  Respiratory:  Negative for cough, chest tightness, shortness of breath and wheezing.   Cardiovascular:  Negative for chest pain, palpitations and leg swelling.  Gastrointestinal:  Negative for abdominal pain, constipation, diarrhea, nausea and vomiting.  Genitourinary:  Negative for dysuria, frequency and hematuria.  Musculoskeletal:  Negative for arthralgias and neck pain.  Skin:  Negative for rash.  Neurological:  Negative for dizziness, weakness, light-headedness, numbness and headaches.  Hematological:  Negative  for adenopathy.  Psychiatric/Behavioral:  Negative for behavioral problems, dysphoric mood and sleep disturbance.    Per HPI unless specifically indicated above      Objective:    BP (!) 133/56   Pulse 65   Ht 5' 8.5" (1.74 m)   Wt 186 lb 9.6 oz (84.6 kg)   SpO2 98%   BMI 27.96 kg/m   Wt Readings from Last 3 Encounters:  10/26/21 186 lb 9.6 oz (84.6 kg)  07/14/21 182 lb 6.4 oz (82.7 kg)  07/02/21 180 lb 9.6 oz (81.9 kg)    Physical Exam Vitals and nursing note reviewed.  Constitutional:      General: He is not in acute distress.    Appearance: He is well-developed. He is not diaphoretic.     Comments: Well-appearing, comfortable, cooperative  HENT:     Head: Normocephalic and atraumatic.  Eyes:     General:        Right eye: No discharge.        Left eye: No discharge.     Conjunctiva/sclera: Conjunctivae normal.     Pupils: Pupils are equal, round, and reactive to light.  Neck:     Thyroid: No thyromegaly.     Vascular: No carotid bruit.  Cardiovascular:     Rate and Rhythm: Normal rate and regular rhythm.     Pulses: Normal pulses.     Heart sounds: Normal heart sounds. No murmur heard. Pulmonary:     Effort: Pulmonary effort is normal. No respiratory distress.     Breath sounds: Normal breath sounds. No wheezing or rales.  Abdominal:     General: Bowel sounds are normal. There is no distension.     Palpations: Abdomen is soft. There is no mass.     Tenderness: There is no abdominal tenderness.  Musculoskeletal:        General: No tenderness. Normal range of motion.     Cervical back: Normal range of motion and neck  supple.     Right lower leg: No edema.     Left lower leg: No edema.     Comments: Upper / Lower Extremities: - Normal muscle tone, strength bilateral upper extremities 5/5, lower extremities 5/5  Lymphadenopathy:     Cervical: No cervical adenopathy.  Skin:    General: Skin is warm and dry.     Findings: No erythema or rash.  Neurological:      Mental Status: He is alert and oriented to person, place, and time.     Comments: Distal sensation intact to light touch all extremities  Psychiatric:        Mood and Affect: Mood normal.        Behavior: Behavior normal.        Thought Content: Thought content normal.     Comments: Well groomed, good eye contact, normal speech and thoughts    Recent Labs    04/23/21 0836 10/18/21 0813  HGBA1C 6.9* 7.2*     Diabetic Foot Exam - Simple   Simple Foot Form Diabetic Foot exam was performed with the following findings: Yes 10/26/2021  1:39 PM  Visual Inspection No deformities, no ulcerations, no other skin breakdown bilaterally: Yes Sensation Testing Intact to touch and monofilament testing bilaterally: Yes Pulse Check Posterior Tibialis and Dorsalis pulse intact bilaterally: Yes Comments       Results for orders placed or performed in visit on 10/15/21  PSA  Result Value Ref Range   PSA 1.32 < OR = 4.00 ng/mL  Hemoglobin A1c  Result Value Ref Range   Hgb A1c MFr Bld 7.2 (H) <5.7 % of total Hgb   Mean Plasma Glucose 160 mg/dL   eAG (mmol/L) 8.9 mmol/L  CBC with Differential/Platelet  Result Value Ref Range   WBC 7.3 3.8 - 10.8 Thousand/uL   RBC 4.21 4.20 - 5.80 Million/uL   Hemoglobin 13.3 13.2 - 17.1 g/dL   HCT 39.7 38.5 - 50.0 %   MCV 94.3 80.0 - 100.0 fL   MCH 31.6 27.0 - 33.0 pg   MCHC 33.5 32.0 - 36.0 g/dL   RDW 13.7 11.0 - 15.0 %   Platelets 205 140 - 400 Thousand/uL   MPV 9.7 7.5 - 12.5 fL   Neutro Abs 4,526 1,500 - 7,800 cells/uL   Lymphs Abs 1,730 850 - 3,900 cells/uL   Absolute Monocytes 621 200 - 950 cells/uL   Eosinophils Absolute 372 15 - 500 cells/uL   Basophils Absolute 51 0 - 200 cells/uL   Neutrophils Relative % 62 %   Total Lymphocyte 23.7 %   Monocytes Relative 8.5 %   Eosinophils Relative 5.1 %   Basophils Relative 0.7 %  Lipid panel  Result Value Ref Range   Cholesterol 95 <200 mg/dL   HDL 32 (L) > OR = 40 mg/dL   Triglycerides  431 (H) <150 mg/dL   LDL Cholesterol (Calc)  mg/dL (calc)   Total CHOL/HDL Ratio 3.0 <5.0 (calc)   Non-HDL Cholesterol (Calc) 63 <130 mg/dL (calc)  COMPLETE METABOLIC PANEL WITH GFR  Result Value Ref Range   Glucose, Bld 152 (H) 65 - 99 mg/dL   BUN 27 (H) 7 - 25 mg/dL   Creat 1.07 0.70 - 1.28 mg/dL   eGFR 71 > OR = 60 mL/min/1.19m   BUN/Creatinine Ratio 25 (H) 6 - 22 (calc)   Sodium 140 135 - 146 mmol/L   Potassium 3.4 (L) 3.5 - 5.3 mmol/L   Chloride 105 98 - 110 mmol/L  CO2 25 20 - 32 mmol/L   Calcium 9.2 8.6 - 10.3 mg/dL   Total Protein 6.5 6.1 - 8.1 g/dL   Albumin 4.1 3.6 - 5.1 g/dL   Globulin 2.4 1.9 - 3.7 g/dL (calc)   AG Ratio 1.7 1.0 - 2.5 (calc)   Total Bilirubin 0.3 0.2 - 1.2 mg/dL   Alkaline phosphatase (APISO) 113 35 - 144 U/L   AST 18 10 - 35 U/L   ALT 23 9 - 46 U/L      Assessment & Plan:   Problem List Items Addressed This Visit     Type 2 diabetes mellitus with other specified complication ([place])    New diagnosis T2DM with Elevated A1c up to 7.2 - concern at risk of Diabetes, attributed to diet Concern with HTN, HLD  Plan:  1. Considered diet controlled Diabetes, no new medication for sugar control, focus on diet management. 2. Encourage improved lifestyle - low carb, low sugar diet, reduce portion size, continue improving regular exercise 3. Already on Statin, ARB DM Foot exam Recommend DM Eye Exam Urine microalbumin today      Relevant Orders   Urine Microalbumin w/creat. ratio   Hyperlipidemia associated with type 2 diabetes mellitus ([place])    Still chronic elevated TG Controlled HDL, LDL, not on statin Last lipid 10/2021 The ASCVD Risk score (Arnett DK, et al., 2019) failed to calculate for the following reasons:   The valid total cholesterol range is 130 to 320 mg/dL   Plan: 1. Continue Rosuvastatin Omega 3 2. Continue ASA 72m for primary ASCVD risk reduction - discussed risk benefit 3. Encourage improved lifestyle - low carb/cholesterol,  reduce portion size, continue improving regular exercise      Essential hypertension    Controlled BP Home readings reviewed No known complications except history of PVCs Followed by Cardiology   Plan:  1. Continue current BP regimen - Losartan 103m HCTZ 2567mMetoprolol 37.5mg38mD (1.5 tabs of 25mg23miltiazem 240mg 34mncourage improved lifestyle - low sodium diet, continue regular exercise 3. Continue monitor BP outside office, bring readings to next visit, if persistently >140/90 or new symptoms notify office sooner      BPH with obstruction/lower urinary tract symptoms    Stable without change in LUTS Not on therapy rx Followed by [facility] Dr MichaeMaryan Pulsnegative, and recently done at Urologist as well negative      Other Visit Diagnoses     Annual physical exam    -  Primary       Updated Health Maintenance information Reviewed recent lab results with patient Encouraged improvement to lifestyle with diet and exercise Goal of weight loss   No orders of the defined types were placed in this encounter.    Follow up plan: Return in about 6 months (around 04/27/2022) for 6 month follow-up DM A1c .  [facility] 10/26/2021, 1:15 PM

## 2021-10-26 NOTE — Patient Instructions (Addendum)
Thank you for coming to the office today.  Mild low potassium 3.4, should be 3.5 or higher, at this time, no change. Likely it is lower because of the fluid pill HCTZ.  Keep on a K or potassium rich diet as we discussed.  New diagnosis of type 2 diabetes now with A1c >7 x 2 in a row.  Recent Labs    04/23/21 0836 10/18/21 0813  HGBA1C 6.9* 7.2*   Check blood sugar occasionally for your own knowledge, fasting AM sugar and if you prefer 2 hours after meals and bedtime. Do not need to check more than once per day, unless you are concerned. Or have symptoms.   Please schedule a Follow-up Appointment to: Return in about 6 months (around 04/27/2022) for 6 month follow-up DM A1c .  If you have any other questions or concerns, please feel free to call the office or send a message through [place]. You may also schedule an earlier appointment if necessary.  Additionally, you may be receiving a survey about your experience at our office within a few days to 1 week by e-mail or mail. We value your feedback.  Nobie Putnam, DO [place]

## 2021-10-26 NOTE — Assessment & Plan Note (Signed)
New diagnosis T2DM with Elevated A1c up to 7.2 - concern at risk of Diabetes, attributed to diet Concern with HTN, HLD  Plan:  1. Considered diet controlled Diabetes, no new medication for sugar control, focus on diet management. 2. Encourage improved lifestyle - low carb, low sugar diet, reduce portion size, continue improving regular exercise 3. Already on Statin, ARB DM Foot exam Recommend DM Eye Exam Urine microalbumin today

## 2021-10-26 NOTE — Assessment & Plan Note (Signed)
Still chronic elevated TG Controlled HDL, LDL, not on statin Last lipid 10/2021 The ASCVD Risk score (Arnett DK, et al., 2019) failed to calculate for the following reasons:   The valid total cholesterol range is 130 to 320 mg/dL   Plan: 1. Continue Rosuvastatin Omega 3 2. Continue ASA '81mg'$  for primary ASCVD risk reduction - discussed risk benefit 3. Encourage improved lifestyle - low carb/cholesterol, reduce portion size, continue improving regular exercise

## 2021-10-27 LAB — MICROALBUMIN / CREATININE URINE RATIO
Creatinine, Urine: 112 mg/dL (ref 20–320)
Microalb Creat Ratio: 18 mcg/mg creat (ref <30)
Microalb, Ur: 2 mg/dL

## 2021-11-05 ENCOUNTER — Other Ambulatory Visit: Payer: Self-pay | Admitting: Cardiovascular Disease

## 2021-11-05 DIAGNOSIS — I10 Essential (primary) hypertension: Secondary | ICD-10-CM

## 2021-11-05 DIAGNOSIS — E785 Hyperlipidemia, unspecified: Secondary | ICD-10-CM | POA: Diagnosis not present

## 2021-12-27 ENCOUNTER — Other Ambulatory Visit: Payer: Self-pay | Admitting: Cardiovascular Disease

## 2022-02-07 ENCOUNTER — Other Ambulatory Visit: Payer: Self-pay | Admitting: Cardiovascular Disease

## 2022-02-08 ENCOUNTER — Ambulatory Visit: Payer: Medicare HMO | Attending: Cardiovascular Disease | Admitting: Cardiovascular Disease

## 2022-02-08 ENCOUNTER — Encounter: Payer: Self-pay | Admitting: Cardiovascular Disease

## 2022-02-08 VITALS — BP 130/60 | HR 72 | Ht 68.0 in | Wt 178.1 lb

## 2022-02-08 DIAGNOSIS — I10 Essential (primary) hypertension: Secondary | ICD-10-CM | POA: Diagnosis not present

## 2022-02-08 DIAGNOSIS — I493 Ventricular premature depolarization: Secondary | ICD-10-CM | POA: Diagnosis not present

## 2022-02-08 DIAGNOSIS — E781 Pure hyperglyceridemia: Secondary | ICD-10-CM

## 2022-02-08 MED ORDER — HYDROCHLOROTHIAZIDE 25 MG PO TABS: 25.0000 mg | ORAL_TABLET | Freq: Every day | ORAL | 3 refills | Status: DC

## 2022-02-08 MED ORDER — METOPROLOL TARTRATE 25 MG PO TABS: 37.5000 mg | ORAL_TABLET | Freq: Two times a day (BID) | ORAL | 3 refills | Status: DC

## 2022-02-08 NOTE — Patient Instructions (Signed)
Medication Instructions:  Your physician recommends that you continue on your current medications as directed. Please refer to the Current Medication list given to you today.  *If you need a refill on your cardiac medications before your next appointment, please call your pharmacy*   Lab Work: None ordered If you have labs (blood work) drawn today and your tests are completely normal, you will receive your results only by: MyChart Message (if you have MyChart) OR A paper copy in the mail If you have any lab test that is abnormal or we need to change your treatment, we will call you to review the results.   Testing/Procedures: None ordered   Follow-Up: At [facility], you and your health needs are our priority.  As part of our continuing mission to provide you with exceptional heart care, we have created designated Provider Care Teams.  These Care Teams include your primary Cardiologist (physician) and Advanced Practice Providers (APPs -  Physician Assistants and Nurse Practitioners) who all work together to provide you with the care you need, when you need it.  We recommend signing up for the patient portal called "MyChart".  Sign up information is provided on this After Visit Summary.  MyChart is used to connect with patients for Virtual Visits (Telemedicine).  Patients are able to view lab/test results, encounter notes, upcoming appointments, etc.  Non-urgent messages can be sent to your provider as well.   To learn more about what you can do with MyChart, go to [email or web address].    Your next appointment:   Your physician wants you to follow-up in: 1 year You will receive a reminder letter in the mail two months in advance. If you don't receive a letter, please call our office to schedule the follow-up appointment.   The format for your next appointment:   In Person  Provider:   You may see Muhammad Arida, MD or one of the following Advanced Practice Providers on  your designated Care Team:   Christopher Berge, NP Ryan Dunn, PA-C Cadence Furth, PA-C Sheri Hammock, NP    Other Instructions N/A  Important Information About Sugar       

## 2022-02-08 NOTE — Progress Notes (Signed)
Cardiology Office Note   Date:  02/08/2022   ID:  Kyle Kung., DOB May 18, 1940, MRN [id number]  PCP:  [name], DO  Cardiologist:   Kathlyn Sacramento, MD   Chief Complaint  Patient presents with   Other    12 Month f/u no complaints today. Meds reviewed verbally with pt.       History of Present Illness: Kyle Howry. is a 81 y.o. male who presents for a followup visit regarding palpitations and PVCs. The patient has prolonged history of palpitations thought to be due to PVCs. Negative stress test in the past.  He has chronic medical conditions that include essential hypertension, hypertriglyceridemia and borderline diabetes not on medications. Echocardiogram in 02/14 showed normal LV systolic function without significant valvular abnormalities. There was mild left ventricular hypertrophy with mild diastolic dysfunction. Holter monitor showed excessive monomorphic PVCs of RVOT origin. He has been treated with diltiazem and Metoprolol with excellent control of symptoms. He continues to be very active and enjoys babysitting his grandkids.  He has known history of refractory hypertension.  Renal artery duplex in 2019 showed no evidence of renal artery stenosis.  He has family history of abdominal aortic aneurysm but he had ultrasound done in 2015 and 2019 and both showed normal size aorta.  He has been doing very well with no recent chest pain, shortness of breath or palpitations.  He does complain of mild orthostatic dizziness but no syncope or presyncope.     Past Medical History:  Diagnosis Date   Allergic rhinitis, cause unspecified    Diarrhea    Dysrhythmia    Esophageal reflux    H/O diverticulitis of colon    History of kidney stones    PVC's (premature ventricular contractions)    Right ventricular outflow tract    Past Surgical History:  Procedure Laterality Date   BASAL CELL CARCINOMA EXCISION     CATARACT EXTRACTION, BILATERAL  2020    COLONOSCOPY     COLONOSCOPY WITH PROPOFOL N/A 02/08/2017   Procedure: COLONOSCOPY WITH PROPOFOL;  Surgeon: Manya Silvas, MD;  Location: [facility] ENDOSCOPY;  Service: Endoscopy;  Laterality: N/A;   REPAIR IMPERFORATE ANUS / ANORECTOPLASTY     TRANSURETHRAL RESECTION OF PROSTATE       Current Outpatient Medications  Medication Sig Dispense Refill   aspirin 81 MG tablet Take 81 mg by mouth daily. Takes 5 days a week     azelastine (ASTELIN) 0.1 % nasal spray Place 1 spray into both nostrils 2 (two) times daily.      Blood Pressure Monitoring (BLOOD PRESSURE CUFF) MISC 1 Device by Does not apply route daily. Normal upper arm BP cuff 1 each 0   diltiazem (CARDIZEM CD) 240 MG 24 hr capsule Take 1 capsule (240 mg total) by mouth every morning. 90 capsule 2   Garlic 10 MG CAPS Take by mouth. Unsure of dose     glucose blood test strip ONETOUCH ULTRA BLUE (In Vitro Strip)  1 (one) Strip Strip check blood sugar once daily for 0 days  Quantity: 50;  Refills: 12   Ordered :02-Oct-2013  [name];  Started 03-Jul-2013 Active     glucose blood test strip Use as instructed 100 each 12   hydrochlorothiazide (HYDRODIURIL) 25 MG tablet Take 1 tablet (25 mg total) by mouth daily. 90 tablet 0   ipratropium (ATROVENT) 0.06 % nasal spray Place 2 sprays into both nostrils 4 (four) times daily. For up  to 5-7 days then stop. 15 mL 0   losartan (COZAAR) 100 MG tablet Take 1 tablet (100 mg total) by mouth daily. 90 tablet 3   metoprolol tartrate (LOPRESSOR) 25 MG tablet Take 1.5 tablets (37.5 mg total) by mouth 2 (two) times daily. 270 tablet 0   montelukast (SINGULAIR) 10 MG tablet Take 1 tablet (10 mg total) by mouth at bedtime. 90 tablet 3   NON FORMULARY Vitreous Health Takes 1 capsule daily.     Omega-3 Fatty Acids (OMEGA-3 FISH OIL) 1200 MG CAPS Take 1 capsule (1,200 mg total) by mouth 3 (three) times daily. 90 capsule 0   rosuvastatin (CRESTOR) 10 MG tablet Take 1 tablet (10 mg total) by mouth daily.  90 tablet 0   Sodium Chloride-Sodium Bicarb 1.57 g PACK Place into the nose as needed.      tamsulosin (FLOMAX) 0.4 MG CAPS capsule Take 0.4 mg by mouth daily.     No current facility-administered medications for this visit.    Allergies:   Dust mite extract and Other    Social History:  The patient  reports that he has never smoked. He has never used smokeless tobacco. He reports current alcohol use. He reports that he does not use drugs.   Family History:  The patient's family history includes Hypertension in his father.    ROS:  Please see the history of present illness.   Otherwise, review of systems are positive for none.   All other systems are reviewed and negative.    PHYSICAL EXAM: VS:  BP 130/60 (BP Location: Left Arm, Patient Position: Sitting, Cuff Size: Normal)   Pulse 72   Ht '5\' 8"'$  (1.727 m)   Wt 178 lb 2 oz (80.8 kg)   SpO2 98%   BMI 27.08 kg/m  , BMI Body mass index is 27.08 kg/m. GEN: Well nourished, well developed, in no acute distress  HEENT: normal  Neck: no JVD, carotid bruits, or masses Cardiac: RRR; no murmurs, rubs, or gallops,no edema  Respiratory:  clear to auscultation bilaterally, normal work of breathing GI: soft, nontender, nondistended, + BS MS: no deformity or atrophy  Skin: warm and dry, no rash Neuro:  Strength and sensation are intact Psych: euthymic mood, full affect   EKG:  EKG is ordered today. The ekg ordered today demonstrates sinus rhythm with first-degree AV block   Recent Labs: 10/18/2021: ALT 23; BUN 27; Creat 1.07; Hemoglobin 13.3; Platelets 205; Potassium 3.4; Sodium 140    Lipid Panel    Component Value Date/Time   CHOL 95 10/18/2021 0813   CHOL 103 04/07/2021 0910   TRIG 431 (H) 10/18/2021 0813   HDL 32 (L) 10/18/2021 0813   HDL 33 (L) 04/07/2021 0910   CHOLHDL 3.0 10/18/2021 0813   VLDL 45 (H) 07/06/2016 0001   LDLCALC  10/18/2021 0813     Comment:     . LDL cholesterol not calculated. Triglyceride  levels greater than 400 mg/dL invalidate calculated LDL results. . Reference range: <100 . Desirable range <100 mg/dL for primary prevention;   <70 mg/dL for patients with CHD or diabetic patients  with > or = 2 CHD risk factors. Marland Kitchen LDL-C is now calculated using the Martin-Hopkins  calculation, which is a validated novel method providing  better accuracy than the Friedewald equation in the  estimation of LDL-C.  Cresenciano Genre et al. Annamaria Helling. 8756;433(29): 2061-2068  (http://education.[employer].com/faq/FAQ164)       Wt Readings from Last 3 Encounters:  02/08/22 178 lb  2 oz (80.8 kg)  10/26/21 186 lb 9.6 oz (84.6 kg)  07/14/21 182 lb 6.4 oz (82.7 kg)           No data to display            ASSESSMENT AND PLAN:  1.  Symptomatic PVCs: Well controlled on current dose of diltiazem and metoprolol with no side effects. Normal ejection fraction.  We will refill both medications.  2. Essential hypertension: He has refractory hypertension with no evidence of secondary hypertension.  His blood pressure is reasonably controlled on current medications.  Most recent labs showed mild hypokalemia.  I instructed him to increase dietary potassium intake.  I refilled hydrochlorothiazide.  3.  Hypertriglyceridemia: He was started on rosuvastatin last year given that he is diabetic and has been tolerating the medication.  Triglyceride continues to be elevated but LDL is low.   Disposition:   FU with me in 1 year  Signed,  Kathlyn Sacramento, MD  02/08/2022 4:34 PM    [place]

## 2022-02-28 ENCOUNTER — Other Ambulatory Visit: Payer: Self-pay | Admitting: Cardiovascular Disease

## 2022-03-18 DIAGNOSIS — H26493 Other secondary cataract, bilateral: Secondary | ICD-10-CM | POA: Diagnosis not present

## 2022-03-18 DIAGNOSIS — H43812 Vitreous degeneration, left eye: Secondary | ICD-10-CM | POA: Diagnosis not present

## 2022-03-18 DIAGNOSIS — E119 Type 2 diabetes mellitus without complications: Secondary | ICD-10-CM | POA: Diagnosis not present

## 2022-03-28 ENCOUNTER — Telehealth: Payer: Self-pay

## 2022-03-28 DIAGNOSIS — N401 Enlarged prostate with lower urinary tract symptoms: Secondary | ICD-10-CM

## 2022-03-28 NOTE — Telephone Encounter (Signed)
Copied from [place] [id number]. Topic: Referral - Request for Referral >> Mar 28, 2022 10:07 AM Chapman Fitch wrote: Has patient seen PCP for this complaint? Yes  *If NO, is insurance requiring patient see PCP for this issue before PCP can refer them? Referral for which specialty: Urology  Preferred provider/office:  Reason for referral: Pts urologist Dr. Rogers Blocker is retiring and pt would like to know which office Dr. Raliegh Ip would refer him to / please advise

## 2022-03-28 NOTE — Telephone Encounter (Signed)
Please notify him that I would refer him to:  [facility] -[address] [place],  [place]  [id number] Phone: [phone]  I have not submitted a referral yet, since patient did not ask for the referral yet in his message.  Let me know  Nobie Putnam, [facility] 03/28/2022, 12:47 PM

## 2022-04-04 NOTE — Telephone Encounter (Signed)
Pt was given referral info and states as he has a history to pls send over referral and if someone could call him when this is done he willl get Dr Shelby Mattocks records and make an appt fu at [phone]

## 2022-04-08 NOTE — Addendum Note (Signed)
Addended by: Jearld Fenton on: 04/08/2022 12:39 PM   Modules accepted: Orders

## 2022-04-08 NOTE — Telephone Encounter (Signed)
Pt would like Dr Marthann Schiller nurse to call him back about the referral to a new urologist. Pt states it has been a couple of weeks and he has not heard anything back.  He would like referral to a new urologist.

## 2022-04-08 NOTE — Telephone Encounter (Deleted)
Pt states Dr Rogers Blocker is retiring on 12/20.

## 2022-04-08 NOTE — Telephone Encounter (Signed)
I have placed referral for [facility]

## 2022-04-08 NOTE — Telephone Encounter (Signed)
I have placed referral to [facility] for him

## 2022-04-27 ENCOUNTER — Encounter: Payer: Self-pay | Admitting: Family Medicine

## 2022-04-27 ENCOUNTER — Ambulatory Visit (INDEPENDENT_AMBULATORY_CARE_PROVIDER_SITE_OTHER): Payer: Medicare HMO | Admitting: Family Medicine

## 2022-04-27 VITALS — BP 130/60 | HR 71 | Ht 68.0 in | Wt 179.8 lb

## 2022-04-27 DIAGNOSIS — E1169 Type 2 diabetes mellitus with other specified complication: Secondary | ICD-10-CM

## 2022-04-27 DIAGNOSIS — K219 Gastro-esophageal reflux disease without esophagitis: Secondary | ICD-10-CM | POA: Diagnosis not present

## 2022-04-27 DIAGNOSIS — G4701 Insomnia due to medical condition: Secondary | ICD-10-CM

## 2022-04-27 LAB — POCT GLYCOSYLATED HEMOGLOBIN (HGB A1C): Hemoglobin A1C: 7.2 % — AB (ref 4.0–5.6)

## 2022-04-27 MED ORDER — OMEPRAZOLE 40 MG PO CPDR: 40.0000 mg | DELAYED_RELEASE_CAPSULE | Freq: Every day | ORAL | 2 refills | Status: DC

## 2022-04-27 NOTE — Progress Notes (Signed)
Subjective:    Patient ID: Kyle Vargas., male    DOB: [birth year], 81 y.o.   MRN: [id number]  Kyle Vargas Kyle Vargas. is a 81 y.o. male presenting on 04/27/2022 for Diabetes   HPI  Type 2, Diet Controlled Diabetes POC A1c showed elevated A1c 7.2 prior 6.9, in range of diabetes Previously dx pre diabetes CBGs: Avg 100-120, Low none, High none. Checks CBGs x 2-3 weeks Meds: Never Currently on ARB Lifestyle: - Diet (improved low carb) - Exercise (walks 30 min daily 5 x weekly, works in garden, yard, active at home) - Diabetic Eye Exam from Dr Ellin Mayhew, he has floater in middle of his left eye. Denies hypoglycemia   CHRONIC HTN / Intermittent PVC - controlled. Followed by [facility] Dr Fletcher Anon. Prior work up they checked Renal Artery [place] due to spikes or elevation in BP, results show normal without stenosis. Home BP improved Current Meds - Losartan '100mg'$ , HCTZ '25mg'$ , Metoprolol 37.'5mg'$  twice daily ('25mg'$  tabs, 1.5 per dose), Diltiazem-24 hour '240mg'$  Reports good compliance, took meds today. Tolerating well, w/o complaints. - Taking ASA '81mg'$  x 5 days weekly, skips weekends. No known CAD or CVA - Followed by Dr Fletcher Anon Cardiology for PVC in past, controlled on medicines   HYPERLIPIDEMIA: - Reports concerns with chronic elevated TG for reportedly >20 years and fam history of same. Last lipid panel 10/2021, improved TG on Statin - Currently taking Rosuvastatin '10mg'$  nightly, Fish Oil Omega 3, 1200 - taking x 3 a day - he has not been on Statin or Fibrate in past by his report - he has been taking Garlique supplement OTC and also Cholestoff  Insomnia Reports difficulty with waking up overnight. He has issue with episodic 2-3 hours off and on with sleeping disturbance Normal bedtime 1030- 1130pm, he will get 3-5 hours approx sleep and then waking up issues at that time. Admits some worsening acid reflux issue now may be waking up, worse if eating after 6pm can have worse symptoms - He uses  breathe right strip at night sometimes, but has issue with sinuses. Not having significant apnea, or pain or urinary difficulty at night.       04/27/2022   11:55 AM 10/26/2021    1:37 PM 08/10/2021    1:54 PM  Depression screen PHQ 2/9  Decreased Interest 0 0 0  Down, Depressed, Hopeless 0 0 0  PHQ - 2 Score 0 0 0  Altered sleeping 0  0  Tired, decreased energy 0  0  Change in appetite 0  0  Feeling bad or failure about yourself  0  0  Trouble concentrating 0  0  Moving slowly or fidgety/restless 0  0  Suicidal thoughts 0  0  PHQ-9 Score 0  0  Difficult doing work/chores Not difficult at all  Not difficult at all    Social History   Tobacco Use   Smoking status: Never   Smokeless tobacco: Never  Vaping Use   Vaping Use: Never used  Substance Use Topics   Alcohol use: Yes    Comment: occasionally  2 glasses of wine a month    Drug use: No    Review of Systems Per HPI unless specifically indicated above     Objective:    BP 130/60   Pulse 71   Ht '5\' 8"'$  (1.727 m)   Wt 179 lb 12.8 oz (81.6 kg)   SpO2 99%   BMI 27.34 kg/m   Wt Readings from  Last 3 Encounters:  04/27/22 179 lb 12.8 oz (81.6 kg)  02/08/22 178 lb 2 oz (80.8 kg)  10/26/21 186 lb 9.6 oz (84.6 kg)    Physical Exam Vitals and nursing note reviewed.  Constitutional:      General: He is not in acute distress.    Appearance: Normal appearance. He is well-developed. He is not diaphoretic.     Comments: Well-appearing, comfortable, cooperative  HENT:     Head: Normocephalic and atraumatic.  Eyes:     General:        Right eye: No discharge.        Left eye: No discharge.     Conjunctiva/sclera: Conjunctivae normal.  Neck:     Thyroid: No thyromegaly.  Cardiovascular:     Rate and Rhythm: Normal rate and regular rhythm.     Pulses: Normal pulses.     Heart sounds: Normal heart sounds. No murmur heard. Pulmonary:     Effort: Pulmonary effort is normal. No respiratory distress.     Breath  sounds: Normal breath sounds. No wheezing or rales.  Musculoskeletal:        General: Normal range of motion.     Cervical back: Normal range of motion and neck supple.  Lymphadenopathy:     Cervical: No cervical adenopathy.  Skin:    General: Skin is warm and dry.     Findings: No erythema or rash.  Neurological:     Mental Status: He is alert and oriented to person, place, and time. Mental status is at baseline.  Psychiatric:        Mood and Affect: Mood normal.        Behavior: Behavior normal.        Thought Content: Thought content normal.     Comments: Well groomed, good eye contact, normal speech and thoughts     Recent Labs    10/18/21 0813 04/27/22 1039  HGBA1C 7.2* 7.2*    Results for orders placed or performed in visit on 04/27/22  POCT HgB A1C  Result Value Ref Range   Hemoglobin A1C 7.2 (A) 4.0 - 5.6 %      Assessment & Plan:   Problem List Items Addressed This Visit     Type 2 diabetes mellitus with other specified complication ([place]) - Primary    Controlled type 2 diabetes A1c 7.2 Concern with HTN, HLD  Plan:  1. Diet controlled Diabetes, no new medication for sugar control, focus on diet management. 2. Encourage improved lifestyle - low carb, low sugar diet, reduce portion size, continue improving regular exercise 3. Already on Statin, ARB      Relevant Orders   POCT HgB A1C (Completed)   Other Visit Diagnoses     Gastroesophageal reflux disease without esophagitis       Relevant Medications   omeprazole (PRILOSEC) 40 MG capsule   Insomnia due to medical condition           GERD Uncontrolled Order Omeprazole '40mg'$  daily in the morning before 1st meal 15-20-30 min before first meal, take every day for about 4-6 weeks. AVS info on avoid sudden DC will taper and adjust  Sleep Maintenance insomnia Goal to treat source, GERD Adjust lifestyle factors Can also eventually try Melatonin, supplement that can help you maintain the sleep better. He  prefers to avoid rx medication for sleep aid  Orders Placed This Encounter  Procedures   POCT HgB A1C      Meds ordered this encounter  Medications   omeprazole (PRILOSEC) 40 MG capsule    Sig: Take 1 capsule (40 mg total) by mouth daily before breakfast.    Dispense:  30 capsule    Refill:  2     Follow up plan: Return in about 6 months (around 10/27/2022) for 6 month fasting lab only then 1 week later Annual Physical.  Future labs ordered for 10/2022   Nobie Putnam, [facility] 04/27/2022, 10:38 AM

## 2022-04-27 NOTE — Patient Instructions (Addendum)
Thank you for coming to the office today.  Recent Labs    10/18/21 0813 04/27/22 1039  HGBA1C 7.2* 7.2*    Order Omeprazole '40mg'$  daily in the morning before 1st meal 15-20-30 min before first meal, take every day for about 4-6 weeks.  Don't stop cold [place] we can re order a lower dose or adjust in future, let me know, need to gradually wean or taper down.  It may help you sleep through the night.  Can also eventually try Melatonin, supplement that can help you maintain the sleep better.   Sleep Hygiene Recommendations to promote healthy sleep in all patients, especially if symptoms of insomnia are worsening. Due to the nature of sleep rhythms, if your body gets "out of rhythm", it may take some time before your sleep cycle can be "reset".  Please try to follow as many of the following tips as you can, usually there are only a few of these are the primary cause of the problem.  ?To reset your sleep rhythm, go to bed and get up at the same time every day ?Sleep only long enough to feel rested and then get out of bed ?Do not try to force yourself to sleep. If you can't sleep, get out of bed and try again later. ?Avoid naps during the day, unless excessively tired. The more sleeping during the day, then the less sleep your body needs at night.  ?Have coffee, tea, and other foods that have caffeine only in the morning ?Exercise several days a week, but not right before bed ?If you drink alcohol, prefer to have appropriate drink with one meal, but prefer to avoid alcohol in the evening, and bedtime ?If you smoke, avoid smoking, especially in the evening  ?Avoid watching TV or looking at phones, computers, or reading devices ("e-books") that give off light at least 30 minutes before bed. This artificial light sends "awake signals" to your brain and can make it harder to fall asleep. ?Make your bedroom a comfortable place where it is easy to fall asleep: Put up shades or special blackout  curtains to block light from outside. Use a white noise machine to block noise. Keep the temperature cool. ?Try your best to solve or at least address your problems before you go to bed ?Use relaxation techniques to manage stress. Ask your health care provider to suggest some techniques that may work well for you. These may include: Breathing exercises. Routines to release muscle tension. Visualizing peaceful scenes.   DUE for FASTING BLOOD WORK (no food or drink after midnight before the lab appointment, only water or coffee without cream/sugar on the morning of)  SCHEDULE "Lab Only" visit in the morning at the clinic for lab draw in 6 MONTHS   - Make sure Lab Only appointment is at about 1 week before your next appointment, so that results will be available  For Lab Results, once available within 2-3 days of blood draw, you can can log in to MyChart online to view your results and a brief explanation. Also, we can discuss results at next follow-up visit.    Please schedule a Follow-up Appointment to: Return in about 6 months (around 10/27/2022) for 6 month fasting lab only then 1 week later Annual Physical.  If you have any other questions or concerns, please feel free to call the office or send a message through [place]. You may also schedule an earlier appointment if necessary.  Additionally, you may be receiving a survey about  your experience at our office within a few days to 1 week by e-mail or mail. We value your feedback.  Nobie Putnam, DO [place]

## 2022-04-28 ENCOUNTER — Other Ambulatory Visit: Payer: Self-pay | Admitting: Family Medicine

## 2022-04-28 ENCOUNTER — Encounter: Payer: Self-pay | Admitting: Family Medicine

## 2022-04-28 DIAGNOSIS — I10 Essential (primary) hypertension: Secondary | ICD-10-CM

## 2022-04-28 DIAGNOSIS — N138 Other obstructive and reflux uropathy: Secondary | ICD-10-CM

## 2022-04-28 DIAGNOSIS — Z0000 Encounter for general adult medical examination without abnormal findings: Secondary | ICD-10-CM

## 2022-04-28 DIAGNOSIS — E1169 Type 2 diabetes mellitus with other specified complication: Secondary | ICD-10-CM

## 2022-04-28 DIAGNOSIS — R7303 Prediabetes: Secondary | ICD-10-CM

## 2022-04-28 NOTE — Assessment & Plan Note (Signed)
Controlled type 2 diabetes A1c 7.2 Concern with HTN, HLD  Plan:  1. Diet controlled Diabetes, no new medication for sugar control, focus on diet management. 2. Encourage improved lifestyle - low carb, low sugar diet, reduce portion size, continue improving regular exercise 3. Already on Statin, ARB

## 2022-05-10 ENCOUNTER — Other Ambulatory Visit: Payer: Self-pay | Admitting: Cardiovascular Disease

## 2022-05-18 ENCOUNTER — Telehealth: Payer: Self-pay | Admitting: Family Medicine

## 2022-05-18 NOTE — Telephone Encounter (Signed)
Omeprazole was prescribed a month ago By Dr. Parks Ranger / pt states it is working and his gas and reflux is better / he asked if he should continue taking it or cut back or it or stop / please advise

## 2022-05-19 NOTE — Telephone Encounter (Signed)
Called patient.  He is 100% improved on '40mg'$  after 3 weeks.  He will pick up OTC '20mg'$  dose, and alternate 40 > 20 > 40 > 20 for 1-2 weeks  Then continue OTC omeprazole '20mg'$  daily for 2-3 weeks.  Hold '40mg'$  at pharmacy  If doing well notify me and we can try to order the '20mg'$  dose rx or he can taper off completely  Nobie Putnam, [facility] 05/19/2022, 1:20 PM

## 2022-06-02 DIAGNOSIS — J019 Acute sinusitis, unspecified: Secondary | ICD-10-CM | POA: Diagnosis not present

## 2022-06-02 DIAGNOSIS — J310 Chronic rhinitis: Secondary | ICD-10-CM | POA: Diagnosis not present

## 2022-06-16 DIAGNOSIS — L538 Other specified erythematous conditions: Secondary | ICD-10-CM | POA: Diagnosis not present

## 2022-06-16 DIAGNOSIS — X32XXXA Exposure to sunlight, initial encounter: Secondary | ICD-10-CM | POA: Diagnosis not present

## 2022-06-16 DIAGNOSIS — D0462 Carcinoma in situ of skin of left upper limb, including shoulder: Secondary | ICD-10-CM | POA: Diagnosis not present

## 2022-06-16 DIAGNOSIS — Z85828 Personal history of other malignant neoplasm of skin: Secondary | ICD-10-CM | POA: Diagnosis not present

## 2022-06-16 DIAGNOSIS — L570 Actinic keratosis: Secondary | ICD-10-CM | POA: Diagnosis not present

## 2022-06-16 DIAGNOSIS — L820 Inflamed seborrheic keratosis: Secondary | ICD-10-CM | POA: Diagnosis not present

## 2022-06-16 DIAGNOSIS — D0439 Carcinoma in situ of skin of other parts of face: Secondary | ICD-10-CM | POA: Diagnosis not present

## 2022-06-16 DIAGNOSIS — D485 Neoplasm of uncertain behavior of skin: Secondary | ICD-10-CM | POA: Diagnosis not present

## 2022-06-16 DIAGNOSIS — D225 Melanocytic nevi of trunk: Secondary | ICD-10-CM | POA: Diagnosis not present

## 2022-06-16 DIAGNOSIS — D2262 Melanocytic nevi of left upper limb, including shoulder: Secondary | ICD-10-CM | POA: Diagnosis not present

## 2022-06-16 DIAGNOSIS — D2261 Melanocytic nevi of right upper limb, including shoulder: Secondary | ICD-10-CM | POA: Diagnosis not present

## 2022-07-06 ENCOUNTER — Telehealth: Payer: Self-pay

## 2022-07-06 DIAGNOSIS — K219 Gastro-esophageal reflux disease without esophagitis: Secondary | ICD-10-CM

## 2022-07-06 NOTE — Telephone Encounter (Signed)
Please notify him of the following plan:  Taper down off Omeprazole '20mg'$ .  Weeks 1-2 - take one every other day Week 3-4 - take one every 3rd day (skip 2 days between dose) Week 5 - discontinue  If he has rebound symptoms that are mild, with stomach acid flare - okay to keep on the taper plan it should improve once he is off for longer.  If symptoms return and are persistent, he may resume medication daily and contact us.  Nobie Putnam, [facility] 07/06/2022, 6:42 PM

## 2022-07-06 NOTE — Telephone Encounter (Signed)
Copied from [place] [id number]. Topic: General - Other >> Jul 05, 2022  9:57 AM Chapman Fitch wrote: Reason for CRM: Pt started taking Omeprazole on 12.20.23 and pt stated he is fine and feeling better and has been taking the '20Mg'$  for the last month / Please advise / pt was told to report to Dr. Raliegh Ip so he can know what to do next and if he can stop taking

## 2022-07-07 NOTE — Telephone Encounter (Signed)
Patient wants to know if Dr. Parks Ranger is going to prescribe the medication since it is cheaper than purchasing it over the counter. Please advise.

## 2022-07-08 ENCOUNTER — Ambulatory Visit: Payer: Medicare HMO | Admitting: Urology

## 2022-07-08 ENCOUNTER — Encounter: Payer: Self-pay | Admitting: Urology

## 2022-07-08 VITALS — BP 133/70 | HR 70 | Ht 68.0 in | Wt 180.0 lb

## 2022-07-08 DIAGNOSIS — N401 Enlarged prostate with lower urinary tract symptoms: Secondary | ICD-10-CM | POA: Diagnosis not present

## 2022-07-08 MED ORDER — TAMSULOSIN HCL 0.4 MG PO CAPS: 0.4000 mg | ORAL_CAPSULE | Freq: Every day | ORAL | 3 refills | Status: DC

## 2022-07-08 MED ORDER — OMEPRAZOLE 20 MG PO CPDR: 20.0000 mg | DELAYED_RELEASE_CAPSULE | Freq: Every day | ORAL | 0 refills | Status: DC

## 2022-07-08 NOTE — Telephone Encounter (Signed)
Ordered Omeprazole '20mg'$  to [place], [facility] 07/08/2022, 12:43 PM

## 2022-07-08 NOTE — Telephone Encounter (Signed)
Pt asked if Dr. Raliegh Ip will send Omeprazole '20mg'$  tabs to [place] [facility] today / please advise

## 2022-07-08 NOTE — Addendum Note (Signed)
Addended by: [name] on: 07/08/2022 12:43 PM   Modules accepted: Orders

## 2022-07-08 NOTE — Progress Notes (Signed)
07/08/2022 9:42 AM   Kyle Filbert Kowal Jr. 1941-03-02 [id number]  Referring provider: Jearld Fenton, NP [address] [place],  [place] [address]  Chief Complaint  Patient presents with   New Patient (Initial Visit)   Benign Prostatic Hypertrophy    HPI: Kyle Vargas. is a 82 y.o. male presents for transfer of urologic care after recent retirement of his urologist.  Previously followed by Dr. Yves Dill for BPH and history of stone disease Last visit 07/23/2021 Prior PVP/TURP and cystolitholapaxy 05/08/2006 History ureteroscopic stone removal Presently on tamsulosin 0.4 mg daily No bothersome lower urinary tract symptoms IPSS today 8/35 with QoL rated 1/6 Denies dysuria, gross hematuria No flank, abdominal or pelvic pain   PMH: Past Medical History:  Diagnosis Date   Allergic rhinitis, cause unspecified    Diarrhea    Dysrhythmia    Esophageal reflux    H/O diverticulitis of colon    History of kidney stones    PVC's (premature ventricular contractions)    Right ventricular outflow tract    Surgical History: Past Surgical History:  Procedure Laterality Date   BASAL CELL CARCINOMA EXCISION     CATARACT EXTRACTION, BILATERAL  2020   COLONOSCOPY     COLONOSCOPY WITH PROPOFOL N/A 02/08/2017   Procedure: COLONOSCOPY WITH PROPOFOL;  Surgeon: Manya Silvas, MD;  Location: [facility] ENDOSCOPY;  Service: Endoscopy;  Laterality: N/A;   REPAIR IMPERFORATE ANUS / ANORECTOPLASTY     TRANSURETHRAL RESECTION OF PROSTATE      Home Medications:  Allergies as of 07/08/2022       Reactions   Dust Mite Extract    eye itching   Other Other (See Comments)   Dogs and cats, itching eyes, sneezing         Medication List        Accurate as of July 08, 2022  9:42 AM. If you have any questions, ask your nurse or doctor.          aspirin 81 MG tablet Take 81 mg by mouth daily. Takes 5 days a week   azelastine 0.1 % nasal spray Commonly known as: ASTELIN Place 1 spray into  both nostrils 2 (two) times daily.   Blood Pressure Cuff Misc 1 Device by Does not apply route daily. Normal upper arm BP cuff   diltiazem 240 MG 24 hr capsule Commonly known as: CARDIZEM CD Take 1 capsule (240 mg total) by mouth every morning.   Garlic 10 MG Caps Take by mouth. Unsure of dose   glucose blood test strip Use as instructed   hydrochlorothiazide 25 MG tablet Commonly known as: HYDRODIURIL Take 1 tablet (25 mg total) by mouth daily.   ipratropium 0.06 % nasal spray Commonly known as: ATROVENT Place 2 sprays into both nostrils 4 (four) times daily. For up to 5-7 days then stop.   losartan 100 MG tablet Commonly known as: COZAAR Take 1 tablet (100 mg total) by mouth daily.   metoprolol tartrate 25 MG tablet Commonly known as: LOPRESSOR Take 1.5 tablets (37.5 mg total) by mouth 2 (two) times daily.   montelukast 10 MG tablet Commonly known as: SINGULAIR Take 1 tablet (10 mg total) by mouth at bedtime.   NON FORMULARY Vitreous Health Takes 1 capsule daily.   Omega-3 Fish Oil 1200 MG Caps Take 1 capsule (1,200 mg total) by mouth 3 (three) times daily.   omeprazole 40 MG capsule Commonly known as: PRILOSEC Take 1 capsule (40 mg total) by  mouth daily before breakfast.   rosuvastatin 10 MG tablet Commonly known as: CRESTOR Take 1 tablet (10 mg total) by mouth daily.   Sodium Chloride-Sodium Bicarb 1.57 g Pack Place into the nose as needed.   tamsulosin 0.4 MG Caps capsule Commonly known as: FLOMAX Take 0.4 mg by mouth daily.        Allergies:  Allergies  Allergen Reactions   Dust Mite Extract     eye itching   Other Other (See Comments)    Dogs and cats, itching eyes, sneezing     Family History: Family History  Problem Relation Age of Onset   Hypertension Father     Social History:  reports that he has never smoked. He has never been exposed to tobacco smoke. He has never used smokeless tobacco. He reports current alcohol use. He  reports that he does not use drugs.   Physical Exam: BP 133/70   Pulse 70   Ht '5\' 8"'$  (1.727 m)   Wt 180 lb (81.6 kg)   BMI 27.37 kg/m   Constitutional:  Alert and oriented, No acute distress. HEENT: [place] AT Respiratory: Normal respiratory effort, no increased work of breathing. GI: Abdomen is soft, nontender, nondistended, no abdominal masses Psychiatric: Normal mood and affect.   Assessment & Plan:    1.  BPH with LUTS Mild voiding symptoms on tamsulosin Refill sent to pharmacy Annual follow-up  2.  Prostate cancer screening We discussed AUA/NCCN recommendations for prostate cancer screening with a PSA/DRE between the ages of 49-69.  Healthy patients can be extended to age 66.  He had a PSA 10/18/2021 which was 1.32.  Would recommend to discontinue prostate cancer screening based on the above [place], MD  [place] [address] [place], [place] [address] [phone]

## 2022-07-18 ENCOUNTER — Telehealth: Payer: Self-pay | Admitting: Family Medicine

## 2022-07-18 ENCOUNTER — Other Ambulatory Visit: Payer: Self-pay | Admitting: Family Medicine

## 2022-07-18 DIAGNOSIS — D0439 Carcinoma in situ of skin of other parts of face: Secondary | ICD-10-CM | POA: Diagnosis not present

## 2022-07-18 DIAGNOSIS — J3089 Other allergic rhinitis: Secondary | ICD-10-CM

## 2022-07-18 DIAGNOSIS — L988 Other specified disorders of the skin and subcutaneous tissue: Secondary | ICD-10-CM | POA: Diagnosis not present

## 2022-07-18 DIAGNOSIS — L814 Other melanin hyperpigmentation: Secondary | ICD-10-CM | POA: Diagnosis not present

## 2022-07-18 DIAGNOSIS — L578 Other skin changes due to chronic exposure to nonionizing radiation: Secondary | ICD-10-CM | POA: Diagnosis not present

## 2022-07-18 NOTE — Telephone Encounter (Signed)
Contacted [name] to schedule their annual wellness visit. Call back at later date: 07/18/2022  Sherol Dade; [facility] Direct Dial: [phone]

## 2022-07-18 NOTE — Telephone Encounter (Signed)
Contacted [name] to schedule their annual wellness visit. Appointment made for 08/19/2022.  Sherol Dade; [facility] Direct Dial: [phone]

## 2022-07-19 NOTE — Telephone Encounter (Signed)
Requested Prescriptions  Pending Prescriptions Disp Refills   montelukast (SINGULAIR) 10 MG tablet [Pharmacy Med Name: MONTELUKAST SOD 10 MG TABLET] 90 tablet 0    Sig: Take 1 tablet (10 mg total) by mouth at bedtime.     Pulmonology:  Leukotriene Inhibitors Passed - 07/18/2022 12:54 PM      Passed - Valid encounter within last 12 months    Recent Outpatient Visits           2 months ago Type 2 diabetes mellitus with other specified complication, without long-term current use of insulin [facility])   [place], DO   8 months ago Annual physical exam   [place], DO   1 year ago Pain and swelling of right lower leg   [place], DO   1 year ago Acute non-recurrent frontal sinusitis   [place], DO   1 year ago [facility] [name], DO       Future Appointments             In 3 months [place] Ranger, Devonne Doughty, [facility], [place]   In 11 months Stoioff, Ronda Fairly, MD [place]

## 2022-08-02 DIAGNOSIS — L905 Scar conditions and fibrosis of skin: Secondary | ICD-10-CM | POA: Diagnosis not present

## 2022-08-02 DIAGNOSIS — D2362 Other benign neoplasm of skin of left upper limb, including shoulder: Secondary | ICD-10-CM | POA: Diagnosis not present

## 2022-08-02 DIAGNOSIS — D0462 Carcinoma in situ of skin of left upper limb, including shoulder: Secondary | ICD-10-CM | POA: Diagnosis not present

## 2022-08-19 ENCOUNTER — Ambulatory Visit (INDEPENDENT_AMBULATORY_CARE_PROVIDER_SITE_OTHER): Payer: Medicare HMO

## 2022-08-19 VITALS — Ht 68.0 in | Wt 180.0 lb

## 2022-08-19 DIAGNOSIS — Z0000 Encounter for general adult medical examination without abnormal findings: Secondary | ICD-10-CM | POA: Diagnosis not present

## 2022-08-19 NOTE — Progress Notes (Signed)
I connected with  Kyle Vargas. on 08/19/22 by a audio enabled telemedicine application and verified that I am speaking with the correct person using two identifiers.  Patient Location: Home  Provider Location: Office/Clinic  I discussed the limitations of evaluation and management by telemedicine. The patient expressed understanding and agreed to proceed.  Subjective:   Kyle Vargas. is a 82 y.o. male who presents for Medicare Annual/Subsequent preventive examination.  Review of Systems     Cardiac Risk Factors include: advanced age (>32men, >73 women);hypertension;male gender     Objective:    There were no vitals filed for this visit. There is no height or weight on file to calculate BMI.     08/19/2022    9:34 AM 08/04/2020   11:03 AM 08/01/2019   10:12 AM 12/25/2018   11:34 AM 12/19/2017    9:57 AM 02/08/2017    9:22 AM 12/13/2016    2:55 PM  Advanced Directives  Does Patient Have a Medical Advance Directive? Yes Yes Yes Yes Yes Yes Yes  Type of Estate agent of [place];Living will Healthcare Power of [place];Living will Living will Living will;Healthcare Power of [employer] Power of [place];Living will Healthcare Power of [place];Living will Healthcare Power of [place];Living will  Does patient want to make changes to medical advance directive? No - Patient declined  Yes (MAU/Ambulatory/Procedural Areas - Information given)      Copy of Healthcare Power of Attorney in Chart? Yes - validated most recent copy scanned in chart (See row information) Yes - validated most recent copy scanned in chart (See row information)   No - copy requested No - copy requested No - copy requested    Current Medications (verified) Outpatient Encounter Medications as of 08/19/2022  Medication Sig   aspirin 81 MG tablet Take 81 mg by mouth daily. Takes 5 days a week   azelastine (ASTELIN) 0.1 % nasal spray Place 1 spray into both nostrils 2 (two) times  daily.    Blood Pressure Monitoring (BLOOD PRESSURE CUFF) MISC 1 Device by Does not apply route daily. Normal upper arm BP cuff   diltiazem (CARDIZEM CD) 240 MG 24 hr capsule Take 1 capsule (240 mg total) by mouth every morning.   Garlic 10 MG CAPS Take by mouth. Unsure of dose   glucose blood test strip Use as instructed   hydrochlorothiazide (HYDRODIURIL) 25 MG tablet Take 1 tablet (25 mg total) by mouth daily.   ipratropium (ATROVENT) 0.06 % nasal spray Place 2 sprays into both nostrils 4 (four) times daily. For up to 5-7 days then stop.   losartan (COZAAR) 100 MG tablet Take 1 tablet (100 mg total) by mouth daily.   metoprolol tartrate (LOPRESSOR) 25 MG tablet Take 1.5 tablets (37.5 mg total) by mouth 2 (two) times daily.   montelukast (SINGULAIR) 10 MG tablet Take 1 tablet (10 mg total) by mouth at bedtime.   Omega-3 Fatty Acids (OMEGA-3 FISH OIL) 1200 MG CAPS Take 1 capsule (1,200 mg total) by mouth 3 (three) times daily.   tamsulosin (FLOMAX) 0.4 MG CAPS capsule Take 1 capsule (0.4 mg total) by mouth daily.   NON FORMULARY Vitreous Health Takes 1 capsule daily. (Patient not taking: Reported on 08/19/2022)   omeprazole (PRILOSEC) 20 MG capsule Take 1 capsule (20 mg total) by mouth daily before breakfast. Taper instructions given to reduce dose over next 4 weeks   rosuvastatin (CRESTOR) 10 MG tablet Take 1 tablet (10 mg total) by mouth  daily.   Sodium Chloride-Sodium Bicarb 1.57 g PACK Place into the nose as needed.  (Patient not taking: Reported on 08/19/2022)   No facility-administered encounter medications on file as of 08/19/2022.    Allergies (verified) Dust mite extract and Other   History: Past Medical History:  Diagnosis Date   Allergic rhinitis, cause unspecified    Diarrhea    Dysrhythmia    Esophageal reflux    H/O diverticulitis of colon    History of kidney stones    PVC's (premature ventricular contractions)    Right ventricular outflow tract   Past Surgical  History:  Procedure Laterality Date   BASAL CELL CARCINOMA EXCISION     CATARACT EXTRACTION, BILATERAL  2020   COLONOSCOPY     COLONOSCOPY WITH PROPOFOL N/A 02/08/2017   Procedure: COLONOSCOPY WITH PROPOFOL;  Surgeon: Scot Jun, MD;  Location: [facility] ENDOSCOPY;  Service: Endoscopy;  Laterality: N/A;   REPAIR IMPERFORATE ANUS / ANORECTOPLASTY     TRANSURETHRAL RESECTION OF PROSTATE     Family History  Problem Relation Age of Onset   Hypertension Father    Social History   Socioeconomic History   Marital status: Married    Spouse name: Education officer, community   Number of children: 3   Years of education: Not on file   Highest education level: Some college, no degree  Occupational History   Occupation: retired  Tobacco Use   Smoking status: Never    Passive exposure: Never   Smokeless tobacco: Never  Vaping Use   Vaping Use: Never used  Substance and Sexual Activity   Alcohol use: Yes    Comment: occasionally  2 glasses of wine a month    Drug use: No   Sexual activity: Not on file  Other Topics Concern   Not on file  Social History Narrative   Not on file   Social Determinants of Health   Financial Resource Strain: Low Risk  (08/19/2022)   Overall Financial Resource Strain (CARDIA)    Difficulty of Paying Living Expenses: Not hard at all  Food Insecurity: No Food Insecurity (08/19/2022)   Hunger Vital Sign    Worried About Running Out of Food in the Last Year: Never true    Ran Out of Food in the Last Year: Never true  Transportation Needs: No Transportation Needs (08/19/2022)   PRAPARE - Administrator, Civil Service (Medical): No    Lack of Transportation (Non-Medical): No  Physical Activity: Sufficiently Active (08/19/2022)   Exercise Vital Sign    Days of Exercise per Week: 5 days    Minutes of Exercise per Session: 30 min  Stress: No Stress Concern Present (08/19/2022)   [employer] of Occupational Health - Occupational Stress Questionnaire     Feeling of Stress : Not at all  Social Connections: Moderately Integrated (08/19/2022)   Social Connection and Isolation Panel [NHANES]    Frequency of Communication with Friends and Family: More than three times a week    Frequency of Social Gatherings with Friends and Family: More than three times a week    Attends Religious Services: More than 4 times per year    Active Member of [employer] or Organizations: No    Attends Banker Meetings: Never    Marital Status: Married    Tobacco Counseling Counseling given: Not Answered   Clinical Intake:  Pre-visit preparation completed: Yes  Pain : No/denies pain     Nutritional Risks: None Diabetes: No  How often do you need to have someone help you when you read instructions, pamphlets, or other written materials from your doctor or pharmacy?: 1 - Never  Diabetic?no  Interpreter Needed?: No  Information entered by :: Kennedy Bucker, LPN   Activities of Daily Living    08/19/2022    9:35 AM  In your present state of health, do you have any difficulty performing the following activities:  Hearing? 0  Vision? 0  Difficulty concentrating or making decisions? 0  Walking or climbing stairs? 0  Dressing or bathing? 0  Doing errands, shopping? 0  Preparing Food and eating ? N  Using the Toilet? N  In the past six months, have you accidently leaked urine? N  Do you have problems with loss of bowel control? N  Managing your Medications? N  Managing your Finances? N  Housekeeping or managing your Housekeeping? N    Patient Care Team: Smitty Cords, DO as PCP - General (Family Medicine) Jesusita Oka, MD (Dermatology) Orson Ape, MD as Consulting Physician (Urology) Iran Ouch, MD as Consulting Physician (Cardiology) Isla Pence, OD (Optometry)  Indicate any recent Medical Services you may have received from other than [facility] providers in the past year (date may be approximate).      Assessment:   This is a routine wellness examination for [place].  Hearing/Vision screen Hearing Screening - Comments:: No aids Vision Screening - Comments:: Readers- Dr.Woodard  Dietary issues and exercise activities discussed: Current Exercise Habits: Home exercise routine, Type of exercise: walking, Time (Minutes): 30, Frequency (Times/Week): 5, Weekly Exercise (Minutes/Week): 150, Intensity: Mild   Goals Addressed             This Visit's Progress    DIET - EAT MORE FRUITS AND VEGETABLES         Depression Screen    08/19/2022    9:33 AM 04/27/2022   11:55 AM 10/26/2021    1:37 PM 08/10/2021    1:54 PM 07/14/2021    9:45 AM 04/23/2021    8:43 AM 02/22/2021   10:31 AM  PHQ 2/9 Scores  PHQ - 2 Score 0 0 0 0 0 1 0  PHQ- 9 Score 0 0  0 0 4     Fall Risk    08/19/2022    9:35 AM 04/27/2022   11:55 AM 10/26/2021    2:10 PM 08/10/2021    1:52 PM 07/14/2021    9:45 AM  Fall Risk   Falls in the past year? 0 0 0 0 0  Number falls in past yr: 0 0 0 0 0  Injury with Fall? 0 0 0 0 0  Risk for fall due to : No Fall Risks No Fall Risks No Fall Risks No Fall Risks No Fall Risks  Follow up Falls prevention discussed;Falls evaluation completed Falls evaluation completed Falls evaluation completed Falls evaluation completed Falls evaluation completed    FALL RISK PREVENTION PERTAINING TO THE HOME:  Any stairs in or around the home? Yes  If so, are there any without handrails? No  Home free of loose throw rugs in walkways, pet beds, electrical cords, etc? Yes  Adequate lighting in your home to reduce risk of falls? Yes   ASSISTIVE DEVICES UTILIZED TO PREVENT FALLS:  Life alert? No  Use of a cane, walker or w/c? No  Grab bars in the bathroom? No  Shower chair or bench in shower? Yes  Elevated toilet seat or a handicapped toilet? No  Cognitive Function:    01/23/2015    9:55 AM  MMSE - Mini Mental State Exam  Orientation to time 5  Orientation to Place 5  Registration 3   Attention/ Calculation 5  Recall 2  Language- name 2 objects 2  Language- repeat 1  Language- follow 3 step command 3  Language- read & follow direction 1  Write a sentence 1  Copy design 1  Total score 29        08/19/2022    9:38 AM 08/10/2021    1:55 PM 08/04/2020   11:06 AM 12/25/2018   11:34 AM 12/19/2017    9:57 AM  6CIT Screen  What Year? 0 points 0 points 0 points 0 points 0 points  What month? 0 points 0 points 0 points 0 points 0 points  What time? 0 points 0 points 0 points 0 points 0 points  Count back from 20 0 points 0 points 0 points 0 points 0 points  Months in reverse 0 points 0 points 0 points 0 points 0 points  Repeat phrase 0 points 0 points 0 points 0 points 0 points  Total Score 0 points 0 points 0 points 0 points 0 points    Immunizations Immunization History  Administered Date(s) Administered   Covid-19, Mrna,Vaccine(Spikevax)22yrs and older 04/05/2022   Influenza, High Dose Seasonal PF 02/11/2014, 01/23/2015, 02/04/2016, 02/15/2017, 02/02/2021   Influenza,inj,quad, With Preservative 01/08/2019   Influenza-Unspecified 03/14/2014, 02/07/2018, 01/31/2020, 04/16/2021, 02/22/2022   PFIZER(Purple Top)SARS-COV-2 Vaccination 05/16/2019, 06/06/2019, 02/10/2020, 02/16/2021   Pneumococcal Conjugate-13 01/21/2014   Pneumococcal Polysaccharide-23 03/09/2008   Tdap 01/21/2014, 11/29/2016   Zoster Recombinat (Shingrix) 12/25/2018, 04/02/2019   Zoster, Live 01/23/2015    TDAP status: Up to date  Flu Vaccine status: Up to date  Pneumococcal vaccine status: Up to date  Covid-19 vaccine status: Completed vaccines  Qualifies for Shingles Vaccine? Yes   Zostavax completed Yes   Shingrix Completed?: Yes  Screening Tests Health Maintenance  Topic Date Due   OPHTHALMOLOGY EXAM  Never done   COVID-19 Vaccine (6 - 2023-24 season) 05/31/2022   Diabetic kidney evaluation - eGFR measurement  10/19/2022   Diabetic kidney evaluation - Urine ACR  10/27/2022   FOOT  EXAM  10/27/2022   HEMOGLOBIN A1C  10/27/2022   INFLUENZA VACCINE  12/08/2022   Medicare Annual Wellness (AWV)  08/19/2023   DTaP/Tdap/Td (3 - Td or Tdap) 11/30/2026   Pneumonia Vaccine 54+ Years old  Completed   Zoster Vaccines- Shingrix  Completed   HPV VACCINES  Aged Out    Health Maintenance  Health Maintenance Due  Topic Date Due   OPHTHALMOLOGY EXAM  Never done   COVID-19 Vaccine (6 - 2023-24 season) 05/31/2022    Colorectal cancer screening: No longer required.   Lung Cancer Screening: (Low Dose CT Chest recommended if Age 73-80 years, 30 pack-year currently smoking OR have quit w/in 15years.) does not qualify.   Additional Screening:  Hepatitis C Screening: does not qualify; Completed no  Vision Screening: Recommended annual ophthalmology exams for early detection of glaucoma and other disorders of the eye. Is the patient up to date with their annual eye exam?  Yes  Who is the provider or what is the name of the office in which the patient attends annual eye exams? Dr.Woodard If pt is not established with a provider, would they like to be referred to a provider to establish care? No .   Dental Screening: Recommended annual dental exams for proper oral hygiene  Community Resource Referral / Chronic Care Management: CRR required this visit?  No   CCM required this visit?  No      Plan:     I have personally reviewed and noted the following in the patient's chart:   Medical and social history Use of alcohol, tobacco or illicit drugs  Current medications and supplements including opioid prescriptions. Patient is not currently taking opioid prescriptions. Functional ability and status Nutritional status Physical activity Advanced directives List of other physicians Hospitalizations, surgeries, and ER visits in previous 12 months Vitals Screenings to include cognitive, depression, and falls Referrals and appointments  In addition, I have reviewed and  discussed with patient certain preventive protocols, quality metrics, and best practice recommendations. A written personalized care plan for preventive services as well as general preventive health recommendations were provided to patient.     Hal Hope, LPN   [id number]   Nurse Notes: none

## 2022-08-19 NOTE — Patient Instructions (Signed)
Kyle Vargas , Thank you for taking time to come for your Medicare Wellness Visit. I appreciate your ongoing commitment to your health goals. Please review the following plan we discussed and let me know if I can assist you in the future.   These are the goals we discussed:  Goals      DIET - EAT MORE FRUITS AND VEGETABLES        This is a list of the screening recommended for you and due dates:  Health Maintenance  Topic Date Due   Eye exam for diabetics  Never done   COVID-19 Vaccine (6 - 2023-24 season) 05/31/2022   Yearly kidney function blood test for diabetes  10/19/2022   Yearly kidney health urinalysis for diabetes  10/27/2022   Complete foot exam   10/27/2022   Hemoglobin A1C  10/27/2022   Flu Shot  12/08/2022   Medicare Annual Wellness Visit  08/19/2023   DTaP/Tdap/Td vaccine (3 - Td or Tdap) 11/30/2026   Pneumonia Vaccine  Completed   Zoster (Shingles) Vaccine  Completed   HPV Vaccine  Aged Out    Advanced directives: no  Conditions/risks identified: none  Next appointment: Follow up in one year for your annual wellness visit. 08/25/23 @ 9:15 am by phone  Preventive Care 81 Years and Older, Male  Preventive care refers to lifestyle choices and visits with your health care provider that can promote health and wellness. What does preventive care include? A yearly physical exam. This is also called an annual well check. Dental exams once or twice a year. Routine eye exams. Ask your health care provider how often you should have your eyes checked. Personal lifestyle choices, including: Daily care of your teeth and gums. Regular physical activity. Eating a healthy diet. Avoiding tobacco and drug use. Limiting alcohol use. Practicing safe sex. Taking low doses of aspirin every day. Taking vitamin and mineral supplements as recommended by your health care provider. What happens during an annual well check? The services and screenings done by your health care  provider during your annual well check will depend on your age, overall health, lifestyle risk factors, and family history of disease. Counseling  Your health care provider may ask you questions about your: Alcohol use. Tobacco use. Drug use. Emotional well-being. Home and relationship well-being. Sexual activity. Eating habits. History of falls. Memory and ability to understand (cognition). Work and work Astronomer. Screening  You may have the following tests or measurements: Height, weight, and BMI. Blood pressure. Lipid and cholesterol levels. These may be checked every 5 years, or more frequently if you are over 81 years old. Skin check. Lung cancer screening. You may have this screening every year starting at age 81 if you have a 30-pack-year history of smoking and currently smoke or have quit within the past 15 years. Fecal occult blood test (FOBT) of the stool. You may have this test every year starting at age 81. Flexible sigmoidoscopy or colonoscopy. You may have a sigmoidoscopy every 5 years or a colonoscopy every 10 years starting at age 81. Prostate cancer screening. Recommendations will vary depending on your family history and other risks. Hepatitis C blood test. Hepatitis B blood test. Sexually transmitted disease (STD) testing. Diabetes screening. This is done by checking your blood sugar (glucose) after you have not eaten for a while (fasting). You may have this done every 1-3 years. Abdominal aortic aneurysm (AAA) screening. You may need this if you are a current or former smoker. Osteoporosis. You  may be screened starting at age 30 if you are at high risk. Talk with your health care provider about your test results, treatment options, and if necessary, the need for more tests. Vaccines  Your health care provider may recommend certain vaccines, such as: Influenza vaccine. This is recommended every year. Tetanus, diphtheria, and acellular pertussis (Tdap, Td)  vaccine. You may need a Td booster every 10 years. Zoster vaccine. You may need this after age 81. Pneumococcal 13-valent conjugate (PCV13) vaccine. One dose is recommended after age 3. Pneumococcal polysaccharide (PPSV23) vaccine. One dose is recommended after age 40. Talk to your health care provider about which screenings and vaccines you need and how often you need them. This information is not intended to replace advice given to you by your health care provider. Make sure you discuss any questions you have with your health care provider. Document Released: 05/22/2015 Document Revised: 01/13/2016 Document Reviewed: 02/24/2015 Elsevier Interactive Patient Education  2017 [employer] Prevention in the Home Falls can cause injuries. They can happen to people of all ages. There are many things you can do to make your home safe and to help prevent falls. What can I do on the outside of my home? Regularly fix the edges of walkways and driveways and fix any cracks. Remove anything that might make you trip as you walk through a door, such as a raised step or threshold. Trim any bushes or trees on the path to your home. Use bright outdoor lighting. Clear any walking paths of anything that might make someone trip, such as rocks or tools. Regularly check to see if handrails are loose or broken. Make sure that both sides of any steps have handrails. Any raised decks and porches should have guardrails on the edges. Have any leaves, snow, or ice cleared regularly. Use sand or salt on walking paths during winter. Clean up any spills in your garage right away. This includes oil or grease spills. What can I do in the bathroom? Use night lights. Install grab bars by the toilet and in the tub and shower. Do not use towel bars as grab bars. Use non-skid mats or decals in the tub or shower. If you need to sit down in the shower, use a plastic, non-slip stool. Keep the floor dry. Clean up any  water that spills on the floor as soon as it happens. Remove soap buildup in the tub or shower regularly. Attach bath mats securely with double-sided non-slip rug tape. Do not have throw rugs and other things on the floor that can make you trip. What can I do in the bedroom? Use night lights. Make sure that you have a light by your bed that is easy to reach. Do not use any sheets or blankets that are too big for your bed. They should not hang down onto the floor. Have a firm chair that has side arms. You can use this for support while you get dressed. Do not have throw rugs and other things on the floor that can make you trip. What can I do in the kitchen? Clean up any spills right away. Avoid walking on wet floors. Keep items that you use a lot in easy-to-reach places. If you need to reach something above you, use a strong step stool that has a grab bar. Keep electrical cords out of the way. Do not use floor polish or wax that makes floors slippery. If you must use wax, use non-skid floor wax. Do  not have throw rugs and other things on the floor that can make you trip. What can I do with my stairs? Do not leave any items on the stairs. Make sure that there are handrails on both sides of the stairs and use them. Fix handrails that are broken or loose. Make sure that handrails are as long as the stairways. Check any carpeting to make sure that it is firmly attached to the stairs. Fix any carpet that is loose or worn. Avoid having throw rugs at the top or bottom of the stairs. If you do have throw rugs, attach them to the floor with carpet tape. Make sure that you have a light switch at the top of the stairs and the bottom of the stairs. If you do not have them, ask someone to add them for you. What else can I do to help prevent falls? Wear shoes that: Do not have high heels. Have rubber bottoms. Are comfortable and fit you well. Are closed at the toe. Do not wear sandals. If you use a  stepladder: Make sure that it is fully opened. Do not climb a closed stepladder. Make sure that both sides of the stepladder are locked into place. Ask someone to hold it for you, if possible. Clearly mark and make sure that you can see: Any grab bars or handrails. First and last steps. Where the edge of each step is. Use tools that help you move around (mobility aids) if they are needed. These include: Canes. Walkers. Scooters. Crutches. Turn on the lights when you go into a dark area. Replace any light bulbs as soon as they burn out. Set up your furniture so you have a clear path. Avoid moving your furniture around. If any of your floors are uneven, fix them. If there are any pets around you, be aware of where they are. Review your medicines with your doctor. Some medicines can make you feel dizzy. This can increase your chance of falling. Ask your doctor what other things that you can do to help prevent falls. This information is not intended to replace advice given to you by your health care provider. Make sure you discuss any questions you have with your health care provider. Document Released: 02/19/2009 Document Revised: 10/01/2015 Document Reviewed: 05/30/2014 Elsevier Interactive Patient Education  2017 [employer].

## 2022-09-19 DIAGNOSIS — Z85828 Personal history of other malignant neoplasm of skin: Secondary | ICD-10-CM | POA: Diagnosis not present

## 2022-10-17 ENCOUNTER — Other Ambulatory Visit: Payer: Self-pay | Admitting: Family Medicine

## 2022-10-17 ENCOUNTER — Other Ambulatory Visit: Payer: Self-pay | Admitting: Cardiovascular Disease

## 2022-10-17 DIAGNOSIS — J3089 Other allergic rhinitis: Secondary | ICD-10-CM

## 2022-10-18 NOTE — Telephone Encounter (Signed)
Requested Prescriptions  Pending Prescriptions Disp Refills   montelukast (SINGULAIR) 10 MG tablet [Pharmacy Med Name: MONTELUKAST SOD 10 MG TABLET] 90 tablet 2    Sig: Take 1 tablet (10 mg total) by mouth at bedtime.     Pulmonology:  Leukotriene Inhibitors Passed - 10/17/2022 10:12 AM      Passed - Valid encounter within last 12 months    Recent Outpatient Visits           5 months ago Type 2 diabetes mellitus with other specified complication, without long-term current use of insulin [facility])   [place] [facility] Smitty Cords, DO   11 months ago Annual physical exam   [place] [facility] Smitty Cords, DO   1 year ago Pain and swelling of right lower leg   [place] [facility] Smitty Cords, DO   1 year ago Acute non-recurrent frontal sinusitis   [place] [facility] Smitty Cords, DO   1 year ago Pre-diabetes   [place] [facility] Smitty Cords, DO       Future Appointments             In 3 weeks Althea Charon, Netta Neat, DO [place] [facility]   In 8 months Stoioff, Verna Czech, MD [facility]

## 2022-10-27 ENCOUNTER — Other Ambulatory Visit: Payer: Medicare HMO

## 2022-10-27 DIAGNOSIS — E1169 Type 2 diabetes mellitus with other specified complication: Secondary | ICD-10-CM

## 2022-10-27 DIAGNOSIS — R7303 Prediabetes: Secondary | ICD-10-CM | POA: Diagnosis not present

## 2022-10-27 DIAGNOSIS — Z0000 Encounter for general adult medical examination without abnormal findings: Secondary | ICD-10-CM | POA: Diagnosis not present

## 2022-10-27 DIAGNOSIS — I10 Essential (primary) hypertension: Secondary | ICD-10-CM

## 2022-10-27 DIAGNOSIS — N138 Other obstructive and reflux uropathy: Secondary | ICD-10-CM

## 2022-10-27 DIAGNOSIS — N401 Enlarged prostate with lower urinary tract symptoms: Secondary | ICD-10-CM | POA: Diagnosis not present

## 2022-10-27 DIAGNOSIS — E785 Hyperlipidemia, unspecified: Secondary | ICD-10-CM | POA: Diagnosis not present

## 2022-10-27 LAB — CBC WITH DIFFERENTIAL/PLATELET
Absolute Monocytes: 615 cells/uL (ref 200–950)
Basophils Absolute: 38 cells/uL (ref 0–200)
MCH: 31 pg (ref 27.0–33.0)
Neutro Abs: 4680 cells/uL (ref 1500–7800)
Platelets: 201 10*3/uL (ref 140–400)
RBC: 4.29 10*6/uL (ref 4.20–5.80)
RDW: 13.8 % (ref 11.0–15.0)
WBC: 7.5 10*3/uL (ref 3.8–10.8)

## 2022-10-27 LAB — LIPID PANEL
HDL: 38 mg/dL — ABNORMAL LOW (ref >=40)
Non-HDL Cholesterol (Calc): 56 mg/dL (calc) (ref <130)
Triglycerides: 282 mg/dL — ABNORMAL HIGH (ref <150)

## 2022-10-27 LAB — COMPLETE METABOLIC PANEL WITH GFR
Albumin: 3.9 g/dL (ref 3.6–5.1)
CO2: 26 mmol/L (ref 20–32)
Calcium: 8.9 mg/dL (ref 8.6–10.3)
Creat: 1.1 mg/dL (ref 0.70–1.22)
Globulin: 2.6 g/dL (calc) (ref 1.9–3.7)
Total Protein: 6.5 g/dL (ref 6.1–8.1)

## 2022-10-28 LAB — CBC WITH DIFFERENTIAL/PLATELET
Basophils Relative: 0.5 %
Eosinophils Absolute: 255 cells/uL (ref 15–500)
Eosinophils Relative: 3.4 %
HCT: 40.2 % (ref 38.5–50.0)
Hemoglobin: 13.3 g/dL (ref 13.2–17.1)
Lymphs Abs: 1913 cells/uL (ref 850–3900)
MCHC: 33.1 g/dL (ref 32.0–36.0)
MCV: 93.7 fL (ref 80.0–100.0)
MPV: 10 fL (ref 7.5–12.5)
Monocytes Relative: 8.2 %
Neutrophils Relative %: 62.4 %
Total Lymphocyte: 25.5 %

## 2022-10-28 LAB — COMPLETE METABOLIC PANEL WITH GFR
AG Ratio: 1.5 (calc) (ref 1.0–2.5)
ALT: 27 U/L (ref 9–46)
AST: 22 U/L (ref 10–35)
Alkaline phosphatase (APISO): 98 U/L (ref 35–144)
BUN: 19 mg/dL (ref 7–25)
Chloride: 104 mmol/L (ref 98–110)
Glucose, Bld: 156 mg/dL — ABNORMAL HIGH (ref 65–99)
Potassium: 3.4 mmol/L — ABNORMAL LOW (ref 3.5–5.3)
Sodium: 141 mmol/L (ref 135–146)
Total Bilirubin: 0.6 mg/dL (ref 0.2–1.2)
eGFR: 68 mL/min/{1.73_m2} (ref >=60)

## 2022-10-28 LAB — TSH: TSH: 2.72 mIU/L (ref 0.40–4.50)

## 2022-10-28 LAB — LIPID PANEL
Cholesterol: 94 mg/dL (ref <200)
LDL Cholesterol (Calc): 23 mg/dL (calc)
Total CHOL/HDL Ratio: 2.5 (calc) (ref <5.0)

## 2022-10-28 LAB — PSA: PSA: 1.33 ng/mL (ref <=4.00)

## 2022-10-28 LAB — HEMOGLOBIN A1C
Hgb A1c MFr Bld: 8.9 % of total Hgb — ABNORMAL HIGH (ref <5.7)
Mean Plasma Glucose: 209 mg/dL
eAG (mmol/L): 11.6 mmol/L

## 2022-10-31 ENCOUNTER — Other Ambulatory Visit: Payer: Self-pay | Admitting: Family Medicine

## 2022-10-31 DIAGNOSIS — I10 Essential (primary) hypertension: Secondary | ICD-10-CM

## 2022-11-01 NOTE — Telephone Encounter (Signed)
Requested Prescriptions  Pending Prescriptions Disp Refills   losartan (COZAAR) 100 MG tablet [Pharmacy Med Name: LOSARTAN POTASSIUM 100 MG TAB] 90 tablet 0    Sig: Take 1 tablet (100 mg total) by mouth daily.     Cardiovascular:  Angiotensin Receptor Blockers Failed - 10/31/2022  2:27 PM      Failed - K in normal range and within 180 days    Potassium  Date Value Ref Range Status  10/27/2022 3.4 (L) 3.5 - 5.3 mmol/L Final  06/02/2012 3.9 3.5 - 5.1 mmol/L Final         Passed - Cr in normal range and within 180 days    Creat  Date Value Ref Range Status  10/27/2022 1.10 0.70 - 1.22 mg/dL Final   Creatinine, Urine  Date Value Ref Range Status  10/26/2021 112 20 - 320 mg/dL Final         Passed - Patient is not pregnant      Passed - Last BP in normal range    BP Readings from Last 1 Encounters:  07/08/22 133/70         Passed - Valid encounter within last 6 months    Recent Outpatient Visits           6 months ago Type 2 diabetes mellitus with other specified complication, without long-term current use of insulin [facility])   [place] [facility] Smitty Cords, DO   1 year ago Annual physical exam   [place] [facility] Smitty Cords, DO   1 year ago Pain and swelling of right lower leg   [place] [facility] Smitty Cords, DO   1 year ago Acute non-recurrent frontal sinusitis   [place] [facility] Smitty Cords, DO   1 year ago Pre-diabetes   [place] [facility] Althea Charon, Netta Neat, DO       Future Appointments             In 1 week Althea Charon, Netta Neat, DO [place] [facility]   In 8 months Stoioff, Verna Czech, MD [facility] Urology [place]

## 2022-11-08 ENCOUNTER — Ambulatory Visit (INDEPENDENT_AMBULATORY_CARE_PROVIDER_SITE_OTHER): Payer: Medicare HMO | Admitting: Family Medicine

## 2022-11-08 ENCOUNTER — Encounter: Payer: Self-pay | Admitting: Family Medicine

## 2022-11-08 VITALS — BP 120/68 | HR 61 | Temp 97.9°F | Resp 17 | Ht 68.0 in | Wt 177.2 lb

## 2022-11-08 DIAGNOSIS — N401 Enlarged prostate with lower urinary tract symptoms: Secondary | ICD-10-CM | POA: Diagnosis not present

## 2022-11-08 DIAGNOSIS — Z0000 Encounter for general adult medical examination without abnormal findings: Secondary | ICD-10-CM

## 2022-11-08 DIAGNOSIS — I10 Essential (primary) hypertension: Secondary | ICD-10-CM | POA: Diagnosis not present

## 2022-11-08 DIAGNOSIS — E1169 Type 2 diabetes mellitus with other specified complication: Secondary | ICD-10-CM | POA: Diagnosis not present

## 2022-11-08 DIAGNOSIS — E785 Hyperlipidemia, unspecified: Secondary | ICD-10-CM

## 2022-11-08 DIAGNOSIS — G4701 Insomnia due to medical condition: Secondary | ICD-10-CM | POA: Diagnosis not present

## 2022-11-08 DIAGNOSIS — N138 Other obstructive and reflux uropathy: Secondary | ICD-10-CM | POA: Diagnosis not present

## 2022-11-08 MED ORDER — LOSARTAN POTASSIUM 100 MG PO TABS: 100.0000 mg | ORAL_TABLET | Freq: Every day | ORAL | 3 refills | Status: DC

## 2022-11-08 MED ORDER — HYDROCHLOROTHIAZIDE 12.5 MG PO TABS
12.5000 mg | ORAL_TABLET | Freq: Every day | ORAL | 3 refills | Status: DC
Start: 2022-11-08 — End: 2023-12-25

## 2022-11-08 NOTE — Progress Notes (Unsigned)
Subjective:    Patient ID: Kyle Pepper., male    DOB: Oct 26, 1940, 82 y.o.   MRN: [id number]  Kyle Tuell Waage Montez Hageman. is a 82 y.o. male presenting on 11/08/2022 for Annual Exam   HPI  Here for Annual Physical and Lab Review  Type 2, Diet Controlled Diabetes A1c elevated to 8.9, prior 7.2 Admits diet inc too many chips CBGs: Avg 100-120, Low none, High none. Checks CBGs x 2-3 weeks Meds: Never Currently on ARB Lifestyle: - Diet (goal to improve, reduce sodas and chips) - Exercise (walks 30 min daily 5 x weekly, works in garden, yard, active at home) - Diabetic Eye Exam from Dr Clydene Pugh, next yearly Diabetic Eye Exam Oct 2024. Denies hypoglycemia   CHRONIC HTN / Intermittent PVC - controlled. Followed by [facility] Cardiology Dr Kirke Corin. Prior work up they checked Renal Artery [place] due to spikes or elevation in BP, results show normal without stenosis. Home BP improved he admits occasional lower readings at time or dizzy. Current Meds - Losartan 100mg , HCTZ 25mg , Metoprolol 37.5mg  twice daily (25mg  tabs, 1.5 per dose), Diltiazem-24 hour 240mg  Reports good compliance, took meds today. Tolerating well, w/o complaints. - Taking ASA 81mg  x 5 days weekly, skips weekends. No known CAD or CVA - Followed by Dr Kirke Corin Cardiology for PVC in past, controlled on medicines   HYPERLIPIDEMIA: - Reports concerns with chronic elevated TG for reportedly >20 years and fam history of same. Last lipid panel 11/2022, improved TG on Statin, TG 280s, from 400s, and LDL 23. - Currently taking Rosuvastatin 10mg  nightly, Fish Oil Omega 3, 1200 - taking x 3 a day - he has not been on Statin or Fibrate in past by his report - he has been taking Garlique supplement OTC and also Cholestoff   Insomnia *** Reports difficulty with waking up overnight. He has issue with episodic 2-3 hours off and on with sleeping disturbance Normal bedtime 1030- 1130pm, he will get 3-5 hours approx sleep and then waking up issues at that  time. Admits some worsening acid reflux issue now may be waking up, worse if eating after 6pm can have worse symptoms - He uses breathe right strip at night sometimes, but has issue with sinuses. Not having significant apnea, or pain or urinary difficulty at night.  Health Maintenance:  PSA 1.33 (10/2022), last lab 1.32  UTD Vaccines.     11/08/2022    8:31 AM 08/19/2022    9:33 AM 04/27/2022   11:55 AM  Depression screen PHQ 2/9  Decreased Interest 0 0 0  Down, Depressed, Hopeless 0 0 0  PHQ - 2 Score 0 0 0  Altered sleeping 1 0 0  Tired, decreased energy 1 0 0  Change in appetite 0 0 0  Feeling bad or failure about yourself  0 0 0  Trouble concentrating 0 0 0  Moving slowly or fidgety/restless 0 0 0  Suicidal thoughts 0 0 0  PHQ-9 Score 2 0 0  Difficult doing work/chores Not difficult at all Not difficult at all Not difficult at all    Past Medical History:  Diagnosis Date   Allergic rhinitis, cause unspecified    Diarrhea    Dysrhythmia    Esophageal reflux    H/O diverticulitis of colon    History of kidney stones    PVC's (premature ventricular contractions)    Right ventricular outflow tract   Past Surgical History:  Procedure Laterality Date   BASAL CELL CARCINOMA EXCISION  CATARACT EXTRACTION, BILATERAL  2020   COLONOSCOPY     COLONOSCOPY WITH PROPOFOL N/A 02/08/2017   Procedure: COLONOSCOPY WITH PROPOFOL;  Surgeon: Scot Jun, MD;  Location: [facility] ENDOSCOPY;  Service: Endoscopy;  Laterality: N/A;   REPAIR IMPERFORATE ANUS / ANORECTOPLASTY     TRANSURETHRAL RESECTION OF PROSTATE     Social History   Socioeconomic History   Marital status: Married    Spouse name: Education officer, community   Number of children: 3   Years of education: Not on file   Highest education level: Some college, no degree  Occupational History   Occupation: retired  Tobacco Use   Smoking status: Never    Passive exposure: Never   Smokeless tobacco: Never  Vaping Use   Vaping  Use: Never used  Substance and Sexual Activity   Alcohol use: Yes    Comment: occasionally  2 glasses of wine a month    Drug use: No   Sexual activity: Not on file  Other Topics Concern   Not on file  Social History Narrative   Not on file   Social Determinants of Health   Financial Resource Strain: Low Risk  (08/19/2022)   Overall Financial Resource Strain (CARDIA)    Difficulty of Paying Living Expenses: Not hard at all  Food Insecurity: No Food Insecurity (08/19/2022)   Hunger Vital Sign    Worried About Running Out of Food in the Last Year: Never true    Ran Out of Food in the Last Year: Never true  Transportation Needs: No Transportation Needs (08/19/2022)   PRAPARE - Administrator, Civil Service (Medical): No    Lack of Transportation (Non-Medical): No  Physical Activity: Sufficiently Active (08/19/2022)   Exercise Vital Sign    Days of Exercise per Week: 5 days    Minutes of Exercise per Session: 30 min  Stress: No Stress Concern Present (08/19/2022)   [employer] of Occupational Health - Occupational Stress Questionnaire    Feeling of Stress : Not at all  Social Connections: Moderately Integrated (08/19/2022)   Social Connection and Isolation Panel [NHANES]    Frequency of Communication with Friends and Family: More than three times a week    Frequency of Social Gatherings with Friends and Family: More than three times a week    Attends Religious Services: More than 4 times per year    Active Member of [employer] or Organizations: No    Attends Banker Meetings: Never    Marital Status: Married  Catering manager Violence: Not At Risk (08/19/2022)   Humiliation, Afraid, Rape, and Kick questionnaire    Fear of Current or Ex-Partner: No    Emotionally Abused: No    Physically Abused: No    Sexually Abused: No   Family History  Problem Relation Age of Onset   Hypertension Father    Current Outpatient Medications on File Prior to Visit   Medication Sig   aspirin 81 MG tablet Take 81 mg by mouth daily. Takes 5 days a week   azelastine (ASTELIN) 0.1 % nasal spray Place 1 spray into both nostrils 2 (two) times daily.    Blood Pressure Monitoring (BLOOD PRESSURE CUFF) MISC 1 Device by Does not apply route daily. Normal upper arm BP cuff   diltiazem (CARDIZEM CD) 240 MG 24 hr capsule Take 1 capsule (240 mg total) by mouth every morning.   Garlic 10 MG CAPS Take by mouth. Unsure of dose   glucose blood test  strip Use as instructed   ipratropium (ATROVENT) 0.06 % nasal spray Place 2 sprays into both nostrils 4 (four) times daily. For up to 5-7 days then stop.   metoprolol tartrate (LOPRESSOR) 25 MG tablet Take 1.5 tablets (37.5 mg total) by mouth 2 (two) times daily.   montelukast (SINGULAIR) 10 MG tablet Take 1 tablet (10 mg total) by mouth at bedtime.   NON FORMULARY Vitreous Health Takes 1 capsule daily.   Omega-3 Fatty Acids (OMEGA-3 FISH OIL) 1200 MG CAPS Take 1 capsule (1,200 mg total) by mouth 3 (three) times daily.   rosuvastatin (CRESTOR) 10 MG tablet Take 1 tablet (10 mg total) by mouth daily.   Sodium Chloride-Sodium Bicarb 1.57 g PACK Place into the nose as needed.   tamsulosin (FLOMAX) 0.4 MG CAPS capsule Take 1 capsule (0.4 mg total) by mouth daily.   No current facility-administered medications on file prior to visit.    Review of Systems  Constitutional:  Negative for activity change, appetite change, chills, diaphoresis, fatigue and fever.  HENT:  Negative for congestion and hearing loss.   Eyes:  Negative for visual disturbance.  Respiratory:  Negative for cough, chest tightness, shortness of breath and wheezing.   Cardiovascular:  Negative for chest pain, palpitations and leg swelling.  Gastrointestinal:  Negative for abdominal pain, constipation, diarrhea, nausea and vomiting.  Genitourinary:  Negative for dysuria, frequency and hematuria.  Musculoskeletal:  Negative for arthralgias and neck pain.  Skin:   Negative for rash.  Neurological:  Negative for dizziness, weakness, light-headedness, numbness and headaches.  Hematological:  Negative for adenopathy.  Psychiatric/Behavioral:  Negative for behavioral problems, dysphoric mood and sleep disturbance.    Per HPI unless specifically indicated above     Objective:    BP 120/68 (BP Location: Right Arm, Patient Position: Sitting, Cuff Size: Normal)   Pulse 61   Temp 97.9 F (36.6 C) (Oral)   Resp 17   Ht 5\' 8"  (1.727 m)   Wt 177 lb 3.2 oz (80.4 kg)   SpO2 98%   BMI 26.94 kg/m   Wt Readings from Last 3 Encounters:  11/08/22 177 lb 3.2 oz (80.4 kg)  08/19/22 180 lb (81.6 kg)  07/08/22 180 lb (81.6 kg)    Physical Exam Vitals and nursing note reviewed.  Constitutional:      General: He is not in acute distress.    Appearance: He is well-developed. He is not diaphoretic.     Comments: Well-appearing, comfortable, cooperative  HENT:     Head: Normocephalic and atraumatic.     Right Ear: There is impacted cerumen.     Left Ear: There is impacted cerumen.  Eyes:     General:        Right eye: No discharge.        Left eye: No discharge.     Conjunctiva/sclera: Conjunctivae normal.     Pupils: Pupils are equal, round, and reactive to light.  Neck:     Thyroid: No thyromegaly.     Vascular: No carotid bruit.  Cardiovascular:     Rate and Rhythm: Normal rate and regular rhythm.     Pulses: Normal pulses.     Heart sounds: Normal heart sounds. No murmur heard. Pulmonary:     Effort: Pulmonary effort is normal. No respiratory distress.     Breath sounds: Normal breath sounds. No wheezing or rales.  Abdominal:     General: Bowel sounds are normal. There is no distension.  Palpations: Abdomen is soft. There is no mass.     Tenderness: There is no abdominal tenderness.  Musculoskeletal:        General: No tenderness. Normal range of motion.     Cervical back: Normal range of motion and neck supple.     Right lower leg: No  edema.     Left lower leg: No edema.     Comments: Upper / Lower Extremities: - Normal muscle tone, strength bilateral upper extremities 5/5, lower extremities 5/5  Lymphadenopathy:     Cervical: No cervical adenopathy.  Skin:    General: Skin is warm and dry.     Findings: No erythema or rash.  Neurological:     Mental Status: He is alert and oriented to person, place, and time.     Comments: Distal sensation intact to light touch all extremities  Psychiatric:        Mood and Affect: Mood normal.        Behavior: Behavior normal.        Thought Content: Thought content normal.     Comments: Well groomed, good eye contact, normal speech and thoughts     Diabetic Foot Exam - Simple   Simple Foot Form Diabetic Foot exam was performed with the following findings: Yes 11/08/2022  9:02 AM  Visual Inspection No deformities, no ulcerations, no other skin breakdown bilaterally: Yes Sensation Testing Intact to touch and monofilament testing bilaterally: Yes Pulse Check Posterior Tibialis and Dorsalis pulse intact bilaterally: Yes Comments     Results for orders placed or performed in visit on 10/27/22  TSH  Result Value Ref Range   TSH 2.72 0.40 - 4.50 mIU/L  PSA  Result Value Ref Range   PSA 1.33 < OR = 4.00 ng/mL  Hemoglobin A1c  Result Value Ref Range   Hgb A1c MFr Bld 8.9 (H) <5.7 % of total Hgb   Mean Plasma Glucose 209 mg/dL   eAG (mmol/L) 09.8 mmol/L  Lipid panel  Result Value Ref Range   Cholesterol 94 <200 mg/dL   HDL 38 (L) > OR = 40 mg/dL   Triglycerides 119 (H) <150 mg/dL   LDL Cholesterol (Calc) 23 mg/dL (calc)   Total CHOL/HDL Ratio 2.5 <5.0 (calc)   Non-HDL Cholesterol (Calc) 56 <147 mg/dL (calc)  CBC with Differential/Platelet  Result Value Ref Range   WBC 7.5 3.8 - 10.8 Thousand/uL   RBC 4.29 4.20 - 5.80 Million/uL   Hemoglobin 13.3 13.2 - 17.1 g/dL   HCT 82.9 56.2 - 13.0 %   MCV 93.7 80.0 - 100.0 fL   MCH 31.0 27.0 - 33.0 pg   MCHC 33.1 32.0 - 36.0  g/dL   RDW 86.5 78.4 - 69.6 %   Platelets 201 140 - 400 Thousand/uL   MPV 10.0 7.5 - 12.5 fL   Neutro Abs 4,680 1,500 - 7,800 cells/uL   Lymphs Abs 1,913 850 - 3,900 cells/uL   Absolute Monocytes 615 200 - 950 cells/uL   Eosinophils Absolute 255 15 - 500 cells/uL   Basophils Absolute 38 0 - 200 cells/uL   Neutrophils Relative % 62.4 %   Total Lymphocyte 25.5 %   Monocytes Relative 8.2 %   Eosinophils Relative 3.4 %   Basophils Relative 0.5 %  COMPLETE METABOLIC PANEL WITH GFR  Result Value Ref Range   Glucose, Bld 156 (H) 65 - 99 mg/dL   BUN 19 7 - 25 mg/dL   Creat 2.95 2.84 - 1.32 mg/dL  eGFR 68 > OR = 60 mL/min/1.65m2   BUN/Creatinine Ratio SEE NOTE: 6 - 22 (calc)   Sodium 141 135 - 146 mmol/L   Potassium 3.4 (L) 3.5 - 5.3 mmol/L   Chloride 104 98 - 110 mmol/L   CO2 26 20 - 32 mmol/L   Calcium 8.9 8.6 - 10.3 mg/dL   Total Protein 6.5 6.1 - 8.1 g/dL   Albumin 3.9 3.6 - 5.1 g/dL   Globulin 2.6 1.9 - 3.7 g/dL (calc)   AG Ratio 1.5 1.0 - 2.5 (calc)   Total Bilirubin 0.6 0.2 - 1.2 mg/dL   Alkaline phosphatase (APISO) 98 35 - 144 U/L   AST 22 10 - 35 U/L   ALT 27 9 - 46 U/L      Assessment & Plan:   Problem List Items Addressed This Visit     BPH with obstruction/lower urinary tract symptoms   Essential hypertension   Relevant Medications   hydrochlorothiazide (HYDRODIURIL) 12.5 MG tablet   losartan (COZAAR) 100 MG tablet   Hyperlipidemia associated with type 2 diabetes mellitus (HCC)   Relevant Medications   hydrochlorothiazide (HYDRODIURIL) 12.5 MG tablet   losartan (COZAAR) 100 MG tablet   Type 2 diabetes mellitus with other specified complication (HCC)   Relevant Medications   losartan (COZAAR) 100 MG tablet   Other Relevant Orders   Urine Microalbumin w/creat. ratio   Other Visit Diagnoses     Annual physical exam    -  Primary   Insomnia due to medical condition           Updated Health Maintenance information Reviewed recent lab results with  patient Encouraged improvement to lifestyle with diet and exercise Goal of weight loss  Orders Placed This Encounter  Procedures   Urine Microalbumin w/creat. ratio     Meds ordered this encounter  Medications   hydrochlorothiazide (HYDRODIURIL) 12.5 MG tablet    Sig: Take 1 tablet (12.5 mg total) by mouth daily.    Dispense:  90 tablet    Refill:  3    Dose reduction from hydrochlorothiazide 25 to 12.5mg    losartan (COZAAR) 100 MG tablet    Sig: Take 1 tablet (100 mg total) by mouth daily.    Dispense:  90 tablet    Refill:  3    Add future refills      Follow up plan: Return in about 5 months (around 04/10/2023) for 5 month follow-up DM A1c, HTN, K.  ***FYI to Dr Kirke Corin, dose reduction hydrochlorothiazide from 25 to 12.5mg  due to lower BP and also HypoK  ***  Saralyn Pilar, DO [facility] 11/08/2022, 8:41 AM

## 2022-11-08 NOTE — Patient Instructions (Addendum)
Thank you for coming to the office today.  Still slightly low Potassium (K) we will REDUCE hydrochlorothiazide from 25 to 12.5mg , new order sent. I will notify Dr Kirke Corin.  Recent Labs    04/27/22 1039 10/27/22 0757  HGBA1C 7.2* 8.9*   5 months to fix the sugar! Try to limit chips soda. No medicines at this time. If still >8.0% by next visit, we can discuss treatment options.  Diet Recommendations for Preventing Diabetes   REDUCE Starchy (carb) foods include: Bread, rice, pasta, potatoes, corn, crackers, bagels, muffins, all baked goods.   FRUITS - LIMIT these HIGH sugar/carb fruits = Pineapple, Watermelon, Bananas - OKAY with these MEDIUM sugar/carb fruits = Citrus, Oranges, Grapes - PREFER these LOW sugar/carb fruits = Apples, Berries, Pears, Plums  Protein foods include: Meat, fish, poultry, eggs, dairy foods, and beans such as pinto and kidney beans (beans also provide carbohydrate).   1. Eat at least 3 meals and 1-2 snacks per day. Never go more than 4-5 hours while awake without eating.   2. Limit starchy foods to TWO per meal and ONE per snack. ONE portion of a starchy  food is equal to the following:   - ONE slice of bread (or its equivalent, such as half of a hamburger bun).   - 1/2 cup of a "scoopable" starchy food such as potatoes or rice.   - 1 OUNCE (28 grams) of starchy snacks (crackers or pretzels, look on label).   - 15 grams of carbohydrate as shown on food label.   3. Both lunch and dinner should include a protein food, a carb food, and vegetables.   - Obtain twice as many veg's as protein or carbohydrate foods for both lunch and dinner.   - Try to keep frozen veg's on hand for a quick vegetable serving.     - Fresh or frozen veg's are best.   4. Breakfast should always include protein.     DUE for FASTING BLOOD WORK (no food or drink after midnight before the lab appointment, only water or coffee without cream/sugar on the morning of)  SCHEDULE "Lab Only"  visit in the morning at the clinic for lab draw in 5 MONTHS   - Make sure Lab Only appointment is at about 1 week before your next appointment, so that results will be available  For Lab Results, once available within 2-3 days of blood draw, you can can log in to MyChart online to view your results and a brief explanation. Also, we can discuss results at next follow-up visit.   Please schedule a Follow-up Appointment to: Return in about 5 months (around 04/10/2023) for 5 month follow-up DM A1c, HTN, K.  If you have any other questions or concerns, please feel free to call the office or send a message through MyChart. You may also schedule an earlier appointment if necessary.  Additionally, you may be receiving a survey about your experience at our office within a few days to 1 week by e-mail or mail. We value your feedback.  Kyle Pilar, DO [facility], [place]

## 2022-11-09 LAB — MICROALBUMIN / CREATININE URINE RATIO
Creatinine, Urine: 122 mg/dL (ref 20–320)
Microalb Creat Ratio: 25 mg/g creat (ref <30)
Microalb, Ur: 3.1 mg/dL

## 2022-11-09 NOTE — Assessment & Plan Note (Signed)
Stable BPH LUTS On Tamsulosin PSA negative

## 2022-11-09 NOTE — Assessment & Plan Note (Signed)
Elevated A1c to 8.9 Concern with HTN, HLD  Plan:  No longer diet controlled Recommend trial with lifestyle - 5 months to manage hyperglycemia, if still >8% will discuss therapy options  Encourage improved lifestyle - low carb, low sugar diet, reduce portion size, continue improving regular exercise Already on Statin, ARB  DM Foot

## 2022-11-09 NOTE — Assessment & Plan Note (Signed)
Still chronic elevated TG Controlled HDL, LDL on statin Last lipid 10/2022 The ASCVD Risk score (Arnett DK, et al., 2019) failed to calculate for the following reasons:   The 2019 ASCVD risk score is only valid for ages 76 to 20   Plan: 1. Continue Rosuvastatin Omega 3 2. Continue ASA 81mg  for primary ASCVD risk reduction - discussed risk benefit 3. Encourage improved lifestyle - low carb/cholesterol, reduce portion size, continue improving regular exercise

## 2022-11-09 NOTE — Assessment & Plan Note (Signed)
Controlled BP, lower reading, occasional dizzy Hypokalemia Home readings reviewed No known complications except history of PVCs Followed by Cardiology   Plan:  Still slightly low Potassium (K) we will REDUCE hydrochlorothiazide from 25 to 12.5mg , new order sent. I will notify Dr Kirke Corin. Continue Losartan 100mg  daily, Metoprolol 37.5mg  TWICE A DAY, Diltiazem 240mg  daily  2. Encourage improved lifestyle - low sodium diet, continue regular exercise 3. Continue monitor BP outside office, bring readings to next visit, if persistently >140/90 or new symptoms notify office sooner

## 2022-11-14 ENCOUNTER — Other Ambulatory Visit: Payer: Self-pay | Admitting: Cardiovascular Disease

## 2023-01-03 IMAGING — US US EXTREM LOW VENOUS
1 series · 13 of 24 positions shown · non-contrast
Comparison: None.

CLINICAL DATA: Bilateral lower extremity swelling and right calf
pain



[Series 1: us extrem low venous · 0.08mm/px · 13 of 50 slices shown]
[im 1/50]
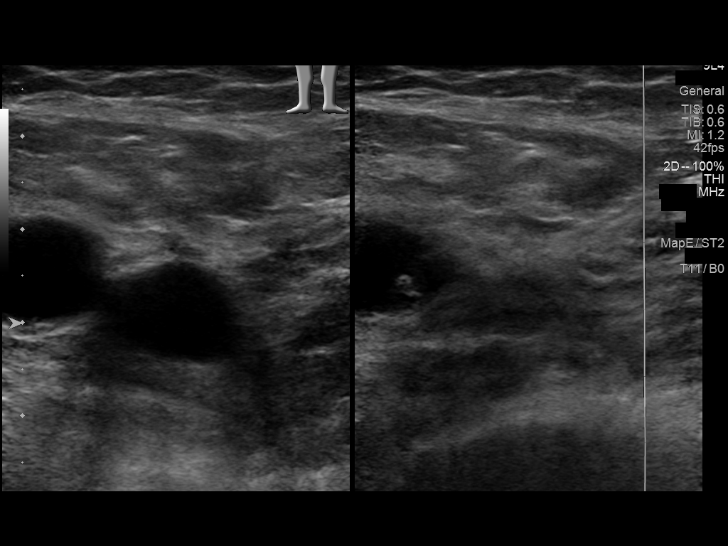
[im 5/50]
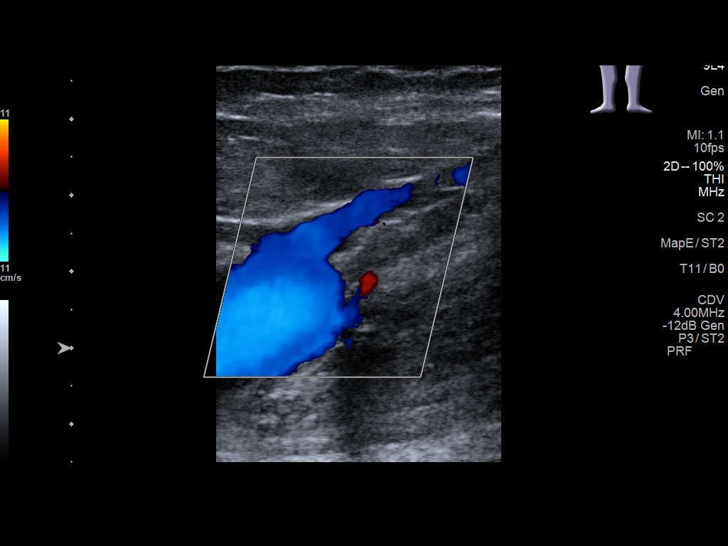
[im 9/50]
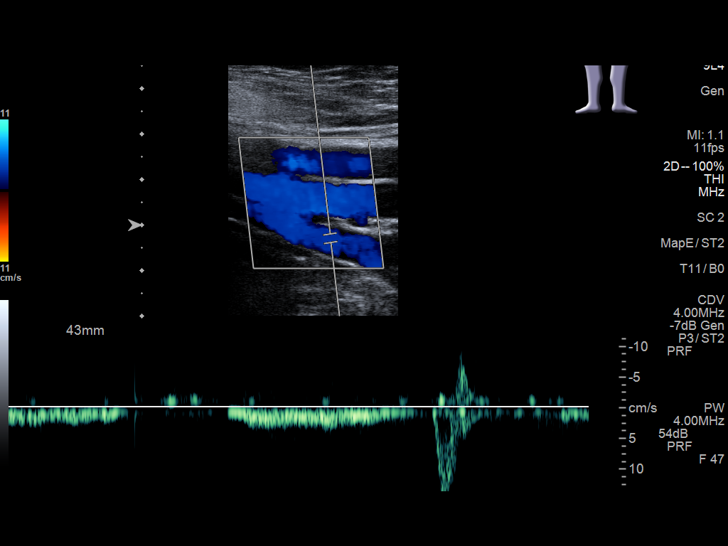
[im 13/50]
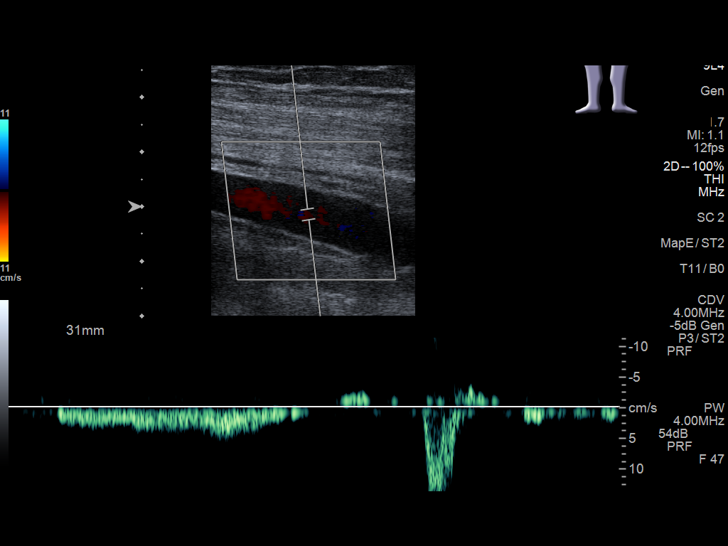
[im 18/50]
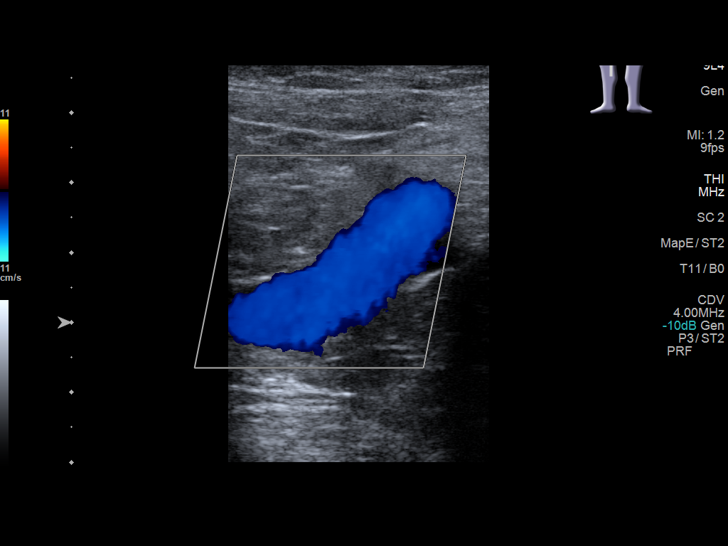
[im 22/50]
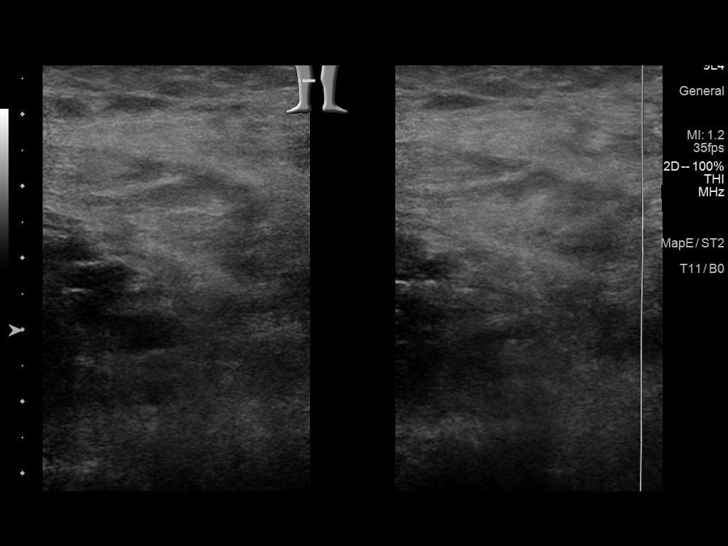
[im 26/50]
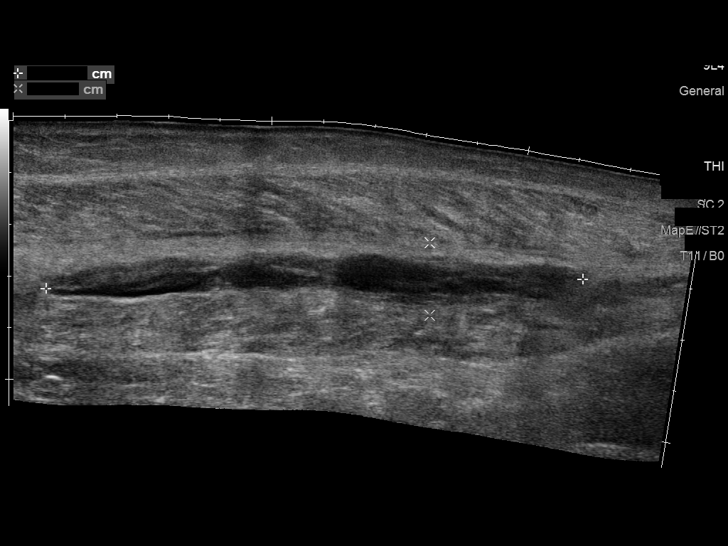
[im 28/50]
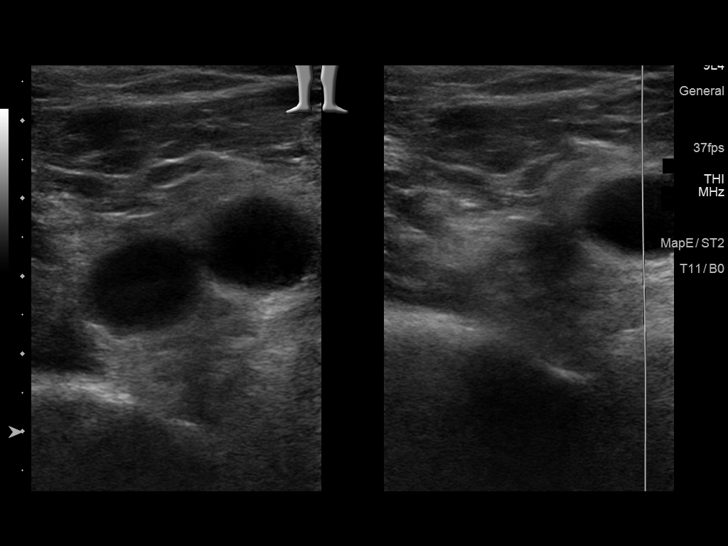
[im 32/50]
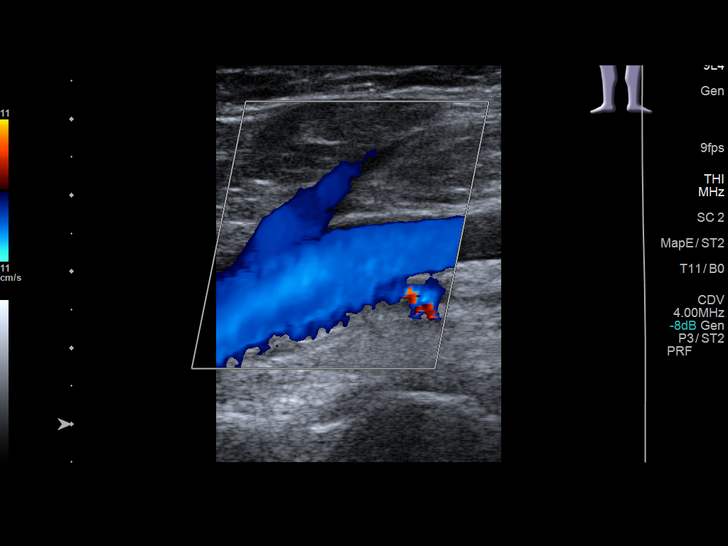
[im 37/50]
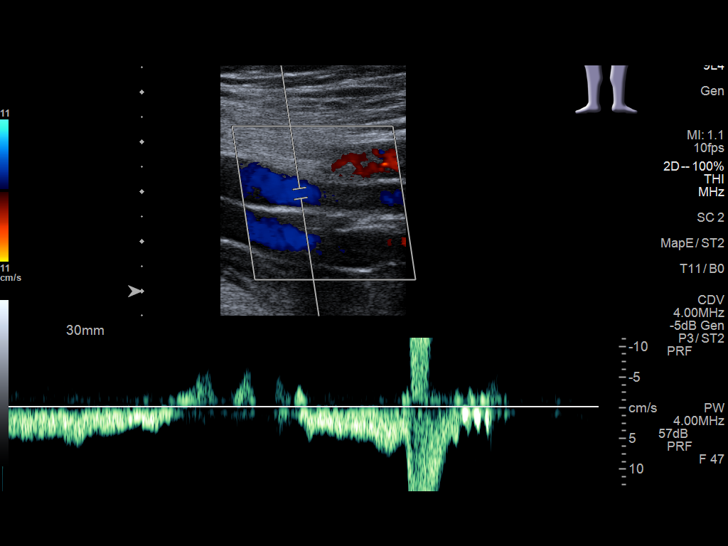
[im 41/50]
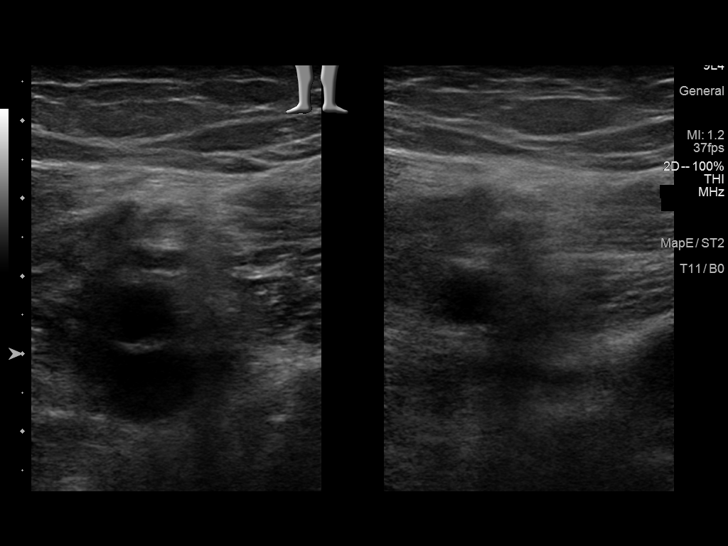
[im 45/50]
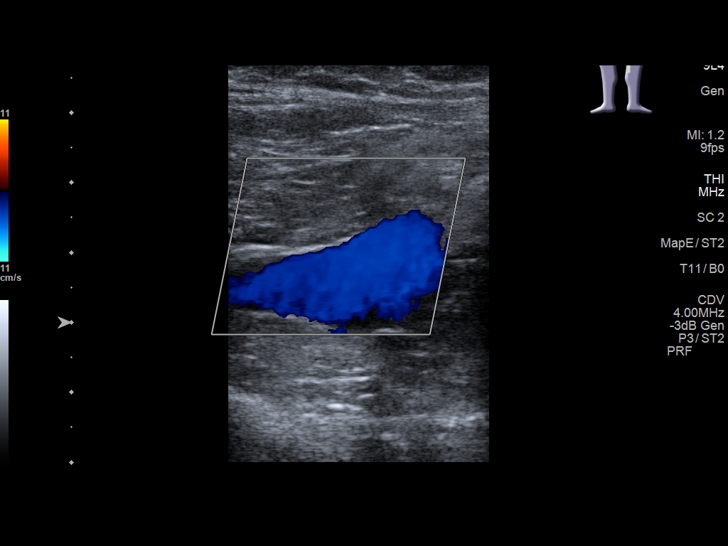
[im 50/50]
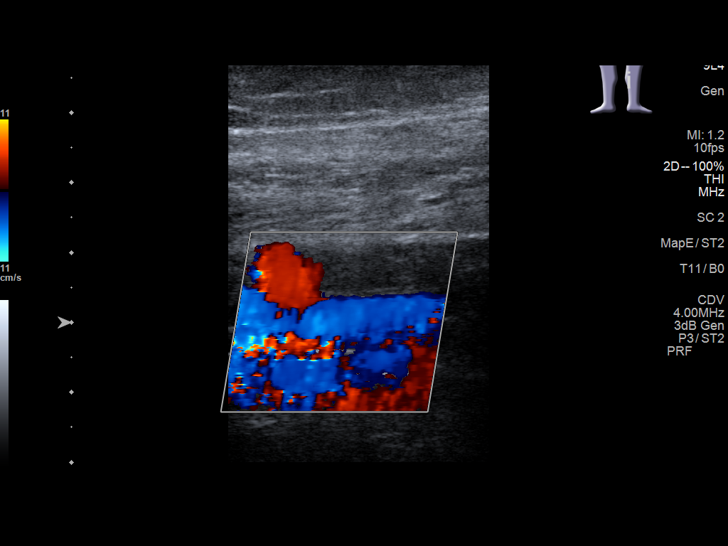

[13 of 24 positions shown; findings below may reference images not displayed]

FINDINGS: RIGHT LOWER EXTREMITY

Common Femoral Vein: No evidence of thrombus. Normal
compressibility, respiratory phasicity and response to augmentation.

Saphenofemoral Junction: No evidence of thrombus. Normal
compressibility and flow on color Doppler imaging.

Profunda Femoral Vein: No evidence of thrombus. Normal
compressibility and flow on color Doppler imaging.

Femoral Vein: No evidence of thrombus. Normal compressibility,
respiratory phasicity and response to augmentation.

Popliteal Vein: No evidence of thrombus. Normal compressibility,
respiratory phasicity and response to augmentation.

Calf Veins: No evidence of thrombus. Normal compressibility and flow
on color Doppler imaging.

Other Findings: Thin elongated complex hypoechoic and septated fluid
collection in the right posterior proximal to mid calf measures 10
cm in length and 1.4 cm in thickness. Diameter proximally 3.6 cm. No
associated vascularity. This is nonspecific but favored to represent
a intramuscular hematoma as seen with a gastrocnemius injury.

LEFT LOWER EXTREMITY

Common Femoral Vein: No evidence of thrombus. Normal
compressibility, respiratory phasicity and response to augmentation.

Saphenofemoral Junction: No evidence of thrombus. Normal
compressibility and flow on color Doppler imaging.

Profunda Femoral Vein: No evidence of thrombus. Normal
compressibility and flow on color Doppler imaging.

Femoral Vein: No evidence of thrombus. Normal compressibility,
respiratory phasicity and response to augmentation.

Popliteal Vein: No evidence of thrombus. Normal compressibility,
respiratory phasicity and response to augmentation.

Calf Veins: No evidence of thrombus. Normal compressibility and flow
on color Doppler imaging.
IMPRESSION: Negative for DVT in either extremity.

Findings suspicious for a right posterior calf intramuscular thin
elongated hematoma. See above comment.

## 2023-01-16 ENCOUNTER — Other Ambulatory Visit: Payer: Self-pay | Admitting: Cardiovascular Disease

## 2023-01-16 DIAGNOSIS — J301 Allergic rhinitis due to pollen: Secondary | ICD-10-CM | POA: Diagnosis not present

## 2023-01-16 DIAGNOSIS — J069 Acute upper respiratory infection, unspecified: Secondary | ICD-10-CM | POA: Diagnosis not present

## 2023-01-19 DIAGNOSIS — Z85828 Personal history of other malignant neoplasm of skin: Secondary | ICD-10-CM | POA: Diagnosis not present

## 2023-01-19 DIAGNOSIS — D2262 Melanocytic nevi of left upper limb, including shoulder: Secondary | ICD-10-CM | POA: Diagnosis not present

## 2023-01-19 DIAGNOSIS — D2271 Melanocytic nevi of right lower limb, including hip: Secondary | ICD-10-CM | POA: Diagnosis not present

## 2023-01-19 DIAGNOSIS — D225 Melanocytic nevi of trunk: Secondary | ICD-10-CM | POA: Diagnosis not present

## 2023-01-19 DIAGNOSIS — D2261 Melanocytic nevi of right upper limb, including shoulder: Secondary | ICD-10-CM | POA: Diagnosis not present

## 2023-01-19 DIAGNOSIS — L570 Actinic keratosis: Secondary | ICD-10-CM | POA: Diagnosis not present

## 2023-01-19 DIAGNOSIS — D2272 Melanocytic nevi of left lower limb, including hip: Secondary | ICD-10-CM | POA: Diagnosis not present

## 2023-02-21 ENCOUNTER — Encounter: Payer: Self-pay | Admitting: Cardiovascular Disease

## 2023-02-21 ENCOUNTER — Ambulatory Visit: Payer: Medicare HMO | Attending: Cardiovascular Disease | Admitting: Cardiovascular Disease

## 2023-02-21 VITALS — BP 140/72 | HR 64 | Ht 68.0 in | Wt 176.2 lb

## 2023-02-21 DIAGNOSIS — E781 Pure hyperglyceridemia: Secondary | ICD-10-CM | POA: Diagnosis not present

## 2023-02-21 DIAGNOSIS — I10 Essential (primary) hypertension: Secondary | ICD-10-CM

## 2023-02-21 DIAGNOSIS — I493 Ventricular premature depolarization: Secondary | ICD-10-CM

## 2023-02-21 MED ORDER — DILTIAZEM HCL ER COATED BEADS 240 MG PO CP24: 240.0000 mg | ORAL_CAPSULE | Freq: Every morning | ORAL | 3 refills | Status: DC

## 2023-02-21 NOTE — Patient Instructions (Signed)
Medication Instructions:  No changes *If you need a refill on your cardiac medications before your next appointment, please call your pharmacy*   Lab Work: None ordered If you have labs (blood work) drawn today and your tests are completely normal, you will receive your results only by: MyChart Message (if you have MyChart) OR A paper copy in the mail If you have any lab test that is abnormal or we need to change your treatment, we will call you to review the results.   Testing/Procedures: None ordered   Follow-Up: At [facility], you and your health needs are our priority.  As part of our continuing mission to provide you with exceptional heart care, we have created designated Provider Care Teams.  These Care Teams include your primary Cardiologist (physician) and Advanced Practice Providers (APPs -  Physician Assistants and Nurse Practitioners) who all work together to provide you with the care you need, when you need it.  We recommend signing up for the patient portal called "MyChart".  Sign up information is provided on this After Visit Summary.  MyChart is used to connect with patients for Virtual Visits (Telemedicine).  Patients are able to view lab/test results, encounter notes, upcoming appointments, etc.  Non-urgent messages can be sent to your provider as well.   To learn more about what you can do with MyChart, go to [email or web address].    Your next appointment:   12 month(s)  Provider:   You may see Dr. Kirke Corin or one of the following Advanced Practice Providers on your designated Care Team:   Nicolasa Ducking, NP Eula Listen, PA-C Cadence Fransico Michael, PA-C Charlsie Quest, NP

## 2023-02-21 NOTE — Progress Notes (Signed)
Cardiology Office Note   Date:  02/21/2023   ID:  Kyle Pepper., DOB 12-Jul-1940, MRN [id number]  PCP:  Smitty Cords, DO  Cardiologist:   Lorine Bears, MD   Chief Complaint  Patient presents with   Follow-up    12 month follow up visit. Patient is doing well on today. Meds reviewed.        History of Present Illness: Kyle Schneller. is a 82 y.o. male who presents for a followup visit regarding palpitations and PVCs. The patient has prolonged history of palpitations thought to be due to PVCs. Negative stress test in the past.  He has chronic medical conditions that include essential hypertension, hypertriglyceridemia and borderline diabetes not on medications. Echocardiogram in 02/14 showed normal LV systolic function without significant valvular abnormalities. There was mild left ventricular hypertrophy with mild diastolic dysfunction. Holter monitor showed excessive monomorphic PVCs of RVOT origin. He has been treated with diltiazem and Metoprolol with excellent control of symptoms. He continues to be very active and enjoys babysitting his grandkids.  He has known history of refractory hypertension.  Renal artery duplex in 2019 showed no evidence of renal artery stenosis.  He has family history of abdominal aortic aneurysm but he had ultrasound done in 2015 and 2019 and both showed normal size aorta.  He has been doing very well with no recent chest pain, shortness of breath or palpitations.  He does complain of mild orthostatic dizziness but no syncope or presyncope. He tries to walk daily for 30 minutes and has not had any issues.     Past Medical History:  Diagnosis Date   Allergic rhinitis, cause unspecified    Diarrhea    Dysrhythmia    Esophageal reflux    H/O diverticulitis of colon    History of kidney stones    PVC's (premature ventricular contractions)    Right ventricular outflow tract    Past Surgical History:  Procedure Laterality  Date   BASAL CELL CARCINOMA EXCISION     CATARACT EXTRACTION, BILATERAL  2020   COLONOSCOPY     COLONOSCOPY WITH PROPOFOL N/A 02/08/2017   Procedure: COLONOSCOPY WITH PROPOFOL;  Surgeon: Scot Jun, MD;  Location: [facility] ENDOSCOPY;  Service: Endoscopy;  Laterality: N/A;   REPAIR IMPERFORATE ANUS / ANORECTOPLASTY     TRANSURETHRAL RESECTION OF PROSTATE       Current Outpatient Medications  Medication Sig Dispense Refill   aspirin 81 MG tablet Take 81 mg by mouth daily. Takes 5 days a week     azelastine (ASTELIN) 0.1 % nasal spray Place 1 spray into both nostrils 2 (two) times daily.      Blood Pressure Monitoring (BLOOD PRESSURE CUFF) MISC 1 Device by Does not apply route daily. Normal upper arm BP cuff 1 each 0   diltiazem (CARDIZEM CD) 240 MG 24 hr capsule Take 1 capsule (240 mg total) by mouth every morning. 90 capsule 0   Garlic 10 MG CAPS Take by mouth. Unsure of dose     glucose blood test strip Use as instructed 100 each 12   hydrochlorothiazide (HYDRODIURIL) 12.5 MG tablet Take 1 tablet (12.5 mg total) by mouth daily. 90 tablet 3   ipratropium (ATROVENT) 0.06 % nasal spray Place 2 sprays into both nostrils 4 (four) times daily. For up to 5-7 days then stop. 15 mL 0   losartan (COZAAR) 100 MG tablet Take 1 tablet (100 mg total) by mouth daily. 90 tablet 3  metoprolol tartrate (LOPRESSOR) 25 MG tablet Take 1.5 tablets (37.5 mg total) by mouth 2 (two) times daily. 270 tablet 3   montelukast (SINGULAIR) 10 MG tablet Take 1 tablet (10 mg total) by mouth at bedtime. 90 tablet 2   NON FORMULARY Vitreous Health Takes 1 capsule daily.     Omega-3 Fatty Acids (OMEGA-3 FISH OIL) 1200 MG CAPS Take 1 capsule (1,200 mg total) by mouth 3 (three) times daily. 90 capsule 0   rosuvastatin (CRESTOR) 10 MG tablet Take 1 tablet (10 mg total) by mouth daily. 90 tablet 0   Sodium Chloride-Sodium Bicarb 1.57 g PACK Place into the nose as needed.     tamsulosin (FLOMAX) 0.4 MG CAPS capsule Take 1  capsule (0.4 mg total) by mouth daily. 90 capsule 3   No current facility-administered medications for this visit.    Allergies:   Dust mite extract and Other    Social History:  The patient  reports that he has never smoked. He has never been exposed to tobacco smoke. He has never used smokeless tobacco. He reports current alcohol use. He reports that he does not use drugs.   Family History:  The patient's family history includes Hypertension in his father.    ROS:  Please see the history of present illness.   Otherwise, review of systems are positive for none.   All other systems are reviewed and negative.    PHYSICAL EXAM: VS:  BP (!) 140/72 (BP Location: Left Arm, Patient Position: Sitting, Cuff Size: Normal)   Pulse 64   Ht 5\' 8"  (1.727 m)   Wt 176 lb 3.2 oz (79.9 kg)   SpO2 97%   BMI 26.79 kg/m  , BMI Body mass index is 26.79 kg/m. GEN: Well nourished, well developed, in no acute distress  HEENT: normal  Neck: no JVD, carotid bruits, or masses Cardiac: RRR; no murmurs, rubs, or gallops,no edema  Respiratory:  clear to auscultation bilaterally, normal work of breathing GI: soft, nontender, nondistended, + BS MS: no deformity or atrophy  Skin: warm and dry, no rash Neuro:  Strength and sensation are intact Psych: euthymic mood, full affect   EKG:  EKG is ordered today. The ekg ordered today demonstrates : Normal sinus rhythm When compared with ECG of 02-Jun-2012 14:19, No significant change was found      Recent Labs: 10/27/2022: ALT 27; BUN 19; Creat 1.10; Hemoglobin 13.3; Platelets 201; Potassium 3.4; Sodium 141; TSH 2.72    Lipid Panel    Component Value Date/Time   CHOL 94 10/27/2022 0757   CHOL 103 04/07/2021 0910   TRIG 282 (H) 10/27/2022 0757   HDL 38 (L) 10/27/2022 0757   HDL 33 (L) 04/07/2021 0910   CHOLHDL 2.5 10/27/2022 0757   VLDL 45 (H) 07/06/2016 0001   LDLCALC 23 10/27/2022 0757      Wt Readings from Last 3 Encounters:  02/21/23 176 lb  3.2 oz (79.9 kg)  11/08/22 177 lb 3.2 oz (80.4 kg)  08/19/22 180 lb (81.6 kg)           No data to display            ASSESSMENT AND PLAN:  1.  Symptomatic PVCs: Well controlled on current dose of diltiazem and metoprolol with no side effects. Normal ejection fraction.  We will refill both medications.  2. Essential hypertension: He has refractory hypertension with no evidence of secondary hypertension.  His blood pressure is reasonably controlled on current medications.  3.  Hypertriglyceridemia: I reviewed most recent lipid profile done in June which showed an LDL of 23 and triglyceride of 282.  Given that he is diabetic, recommend a target LDL of less than 70.  Continue rosuvastatin.  His triglycerides also improved compared to last year.   Disposition:   FU with me in 1 year  Signed,  Lorine Bears, MD  02/21/2023 4:39 PM    [facility]

## 2023-03-20 DIAGNOSIS — E119 Type 2 diabetes mellitus without complications: Secondary | ICD-10-CM | POA: Diagnosis not present

## 2023-03-20 DIAGNOSIS — H26493 Other secondary cataract, bilateral: Secondary | ICD-10-CM | POA: Diagnosis not present

## 2023-03-20 DIAGNOSIS — H43812 Vitreous degeneration, left eye: Secondary | ICD-10-CM | POA: Diagnosis not present

## 2023-03-20 LAB — HM DIABETES EYE EXAM

## 2023-03-21 ENCOUNTER — Encounter: Payer: Self-pay | Admitting: Family Medicine

## 2023-04-03 ENCOUNTER — Other Ambulatory Visit: Payer: Self-pay | Admitting: Family Medicine

## 2023-04-03 ENCOUNTER — Other Ambulatory Visit: Payer: Medicare HMO

## 2023-04-03 DIAGNOSIS — E1169 Type 2 diabetes mellitus with other specified complication: Secondary | ICD-10-CM

## 2023-04-03 DIAGNOSIS — I10 Essential (primary) hypertension: Secondary | ICD-10-CM | POA: Diagnosis not present

## 2023-04-04 LAB — BASIC METABOLIC PANEL WITH GFR
BUN: 20 mg/dL (ref 7–25)
CO2: 27 mmol/L (ref 20–32)
Calcium: 9.2 mg/dL (ref 8.6–10.3)
Chloride: 104 mmol/L (ref 98–110)
Creat: 1.19 mg/dL (ref 0.70–1.22)
Glucose, Bld: 197 mg/dL — ABNORMAL HIGH (ref 65–99)
Potassium: 4.1 mmol/L (ref 3.5–5.3)
Sodium: 140 mmol/L (ref 135–146)
eGFR: 61 mL/min/{1.73_m2} (ref >=60)

## 2023-04-04 LAB — HEMOGLOBIN A1C
Hgb A1c MFr Bld: 8.7 %{Hb} — ABNORMAL HIGH (ref <5.7)
Mean Plasma Glucose: 203 mg/dL
eAG (mmol/L): 11.2 mmol/L

## 2023-04-10 ENCOUNTER — Encounter: Payer: Self-pay | Admitting: Family Medicine

## 2023-04-10 ENCOUNTER — Ambulatory Visit (INDEPENDENT_AMBULATORY_CARE_PROVIDER_SITE_OTHER): Payer: Medicare HMO | Admitting: Family Medicine

## 2023-04-10 VITALS — BP 138/60 | HR 74 | Resp 16 | Ht 68.0 in | Wt 180.2 lb

## 2023-04-10 DIAGNOSIS — I10 Essential (primary) hypertension: Secondary | ICD-10-CM | POA: Diagnosis not present

## 2023-04-10 DIAGNOSIS — J3089 Other allergic rhinitis: Secondary | ICD-10-CM

## 2023-04-10 DIAGNOSIS — E1169 Type 2 diabetes mellitus with other specified complication: Secondary | ICD-10-CM | POA: Diagnosis not present

## 2023-04-10 DIAGNOSIS — E876 Hypokalemia: Secondary | ICD-10-CM | POA: Diagnosis not present

## 2023-04-10 MED ORDER — METFORMIN HCL ER 500 MG PO TB24: 500.0000 mg | ORAL_TABLET | Freq: Every day | ORAL | 1 refills | Status: DC

## 2023-04-10 NOTE — Assessment & Plan Note (Signed)
Elevated A1c to 8.7 slight improved Concern with HTN, HLD  Plan:  Start Metformin XR 500mg  daily, counsel on possible GI intolerance side effect, goal to inc to 2+ tabs daily in future if indicated, track A1c Encourage improved lifestyle - low carb, low sugar diet, reduce portion size, continue improving regular exercise Already on Statin, ARB

## 2023-04-10 NOTE — Progress Notes (Signed)
Subjective:    Patient ID: Kyle Vargas., male    DOB: 1941-05-09, 82 y.o.   MRN: [id number]  Kyle Vargas. is a 82 y.o. male presenting on 04/10/2023 for Diabetes and Hypertension   HPI  Discussed the use of AI scribe software for clinical note transcription with the patient, who gave verbal consent to proceed.        Type 2 Diabetes A1c down to 8.7 (prior range 8.9) Still elevated overall Interested in medication CBGs: Avg 100-120, Low none, High none. Checks CBGs x 2-3 weeks Meds: Never Currently on ARB Lifestyle: - Diet (goal to improve, reduce sodas and chips) - Exercise (walks 30 min daily 5 x weekly, works in garden, yard, active at home) - Diabetic Eye Exam from Dr Kyle Vargas Denies hypoglycemia   CHRONIC HTN / Intermittent PVC - controlled. Followed by [facility] Cardiology Dr Kyle Vargas. Prior work up they checked Renal Artery [place] due to spikes or elevation in BP, results show normal without stenosis. Home BP improved he admits occasional lower readings at time or dizzy. Current Meds - Losartan 100mg , HCTZ 25mg , Metoprolol 37.5mg  twice daily (25mg  tabs, 1.5 per dose), Diltiazem-24 hour 240mg  Reports good compliance, took meds today. Tolerating well, w/o complaints. - Taking ASA 81mg  x 5 days weekly, skips weekends. No known CAD or CVA - Followed by Dr Kyle Vargas Cardiology for PVC in past, controlled on medicines   HYPERLIPIDEMIA: - Currently taking Rosuvastatin 10mg  nightly, Fish Oil Omega 3, 1200 - taking x 3 a day - he has not been on Statin or Fibrate in past by his report - he has been taking Garlique supplement OTC and also Cholestoff  Allergies The patient also reports seasonal environmental allergies, which are currently managed with a daily medication. He expresses a desire to adjust this medication usage to a seasonal basis, as his symptoms are worse during colder months.       04/10/2023    8:21 AM 11/08/2022    8:31 AM 08/19/2022    9:33 AM  Depression  screen PHQ 2/9  Decreased Interest 0 0 0  Down, Depressed, Hopeless 0 0 0  PHQ - 2 Score 0 0 0  Altered sleeping 2 1 0  Tired, decreased energy 1 1 0  Change in appetite 0 0 0  Feeling bad or failure about yourself  0 0 0  Trouble concentrating 0 0 0  Moving slowly or fidgety/restless 0 0 0  Suicidal thoughts 0 0 0  PHQ-9 Score 3 2 0  Difficult doing work/chores Not difficult at all Not difficult at all Not difficult at all       04/10/2023    8:22 AM 11/08/2022    8:32 AM 04/27/2022   11:55 AM 07/14/2021    9:46 AM  GAD 7 : Generalized Anxiety Score  Nervous, Anxious, on Edge 0 0 0 0  Control/stop worrying 0 0 0 0  Worry too much - different things 0 0 0 0  Trouble relaxing 0 0 0 0  Restless 0 0 0 0  Easily annoyed or irritable 0 0 0 0  Afraid - awful might happen 0 0 0 0  Total GAD 7 Score 0 0 0 0  Anxiety Difficulty Not difficult at all Not difficult at all Not difficult at all Not difficult at all    Social History   Tobacco Use   Smoking status: Never    Passive exposure: Never   Smokeless tobacco: Never  Vaping  Use   Vaping status: Never Used  Substance Use Topics   Alcohol use: Yes    Comment: occasionally  2 glasses of wine a month    Drug use: No    Review of Systems Per HPI unless specifically indicated above     Objective:    BP 138/60 (BP Location: Left Arm, Cuff Size: Normal)   Pulse 74   Resp 16   Ht 5\' 8"  (1.727 m)   Wt 180 lb 3.2 oz (81.7 kg)   BMI 27.40 kg/m   Wt Readings from Last 3 Encounters:  04/10/23 180 lb 3.2 oz (81.7 kg)  02/21/23 176 lb 3.2 oz (79.9 kg)  11/08/22 177 lb 3.2 oz (80.4 kg)    Physical Exam Vitals and nursing note reviewed.  Constitutional:      General: He is not in acute distress.    Appearance: Normal appearance. He is well-developed. He is not diaphoretic.     Comments: Well-appearing, comfortable, cooperative  HENT:     Head: Normocephalic and atraumatic.  Eyes:     General:        Right eye: No  discharge.        Left eye: No discharge.     Conjunctiva/sclera: Conjunctivae normal.  Cardiovascular:     Rate and Rhythm: Normal rate.  Pulmonary:     Effort: Pulmonary effort is normal.  Skin:    General: Skin is warm and dry.     Findings: No erythema or rash.  Neurological:     Mental Status: He is alert and oriented to person, place, and time.  Psychiatric:        Mood and Affect: Mood normal.        Behavior: Behavior normal.        Thought Content: Thought content normal.     Comments: Well groomed, good eye contact, normal speech and thoughts       Results for orders placed or performed in visit on 04/03/23  BASIC METABOLIC PANEL WITH GFR  Result Value Ref Range   Glucose, Bld 197 (H) 65 - 99 mg/dL   BUN 20 7 - 25 mg/dL   Creat 4.09 8.11 - 9.14 mg/dL   eGFR 61 > OR = 60 NW/GNF/6.21H0   BUN/Creatinine Ratio SEE NOTE: 6 - 22 (calc)   Sodium 140 135 - 146 mmol/L   Potassium 4.1 3.5 - 5.3 mmol/L   Chloride 104 98 - 110 mmol/L   CO2 27 20 - 32 mmol/L   Calcium 9.2 8.6 - 10.3 mg/dL  Hemoglobin Q6V  Result Value Ref Range   Hgb A1c MFr Bld 8.7 (H) <5.7 % of total Hgb   Mean Plasma Glucose 203 mg/dL   eAG (mmol/L) 78.4 mmol/L      Assessment & Plan:   Problem List Items Addressed This Visit     Essential hypertension    Controlled BP, mild elevated but improved. No longer hypotensive. Hypokalemia - resolved off thiazide now Home readings reviewed No known complications except history of PVCs Followed by Cardiology   Plan:  Continue hydrochlorothiazide 12.5mg  daily (instead of 25mg ), Losartan 100mg  daily, Metoprolol 37.5mg  TWICE A DAY, Diltiazem 240mg  daily  2. Encourage improved lifestyle - low sodium diet, continue regular exercise 3. Continue monitor BP outside office, bring readings to next visit, if persistently >140/90 or new symptoms notify office sooner      Type 2 diabetes mellitus with other specified complication (HCC) - Primary    Elevated  A1c to 8.7 slight improved Concern with HTN, HLD  Plan:  Start Metformin XR 500mg  daily, counsel on possible GI intolerance side effect, goal to inc to 2+ tabs daily in future if indicated, track A1c Encourage improved lifestyle - low carb, low sugar diet, reduce portion size, continue improving regular exercise Already on Statin, ARB      Relevant Medications   metFORMIN (GLUCOPHAGE-XR) 500 MG 24 hr tablet   Other Visit Diagnoses     Hypokalemia       Environmental and seasonal allergies             Seasonal Environmental Allergies Patient inquires about the need to continue daily allergy medication. -Continue allergy medication during seasons when symptoms are worse, can pause during warm weather when symptoms are less severe.         No orders of the defined types were placed in this encounter.   Meds ordered this encounter  Medications   metFORMIN (GLUCOPHAGE-XR) 500 MG 24 hr tablet    Sig: Take 1 tablet (500 mg total) by mouth daily with supper.    Dispense:  90 tablet    Refill:  1    Follow up plan: Return in about 3 months (around 07/09/2023), or 3 month DM A1c.   Saralyn Pilar, DO [facility] 04/10/2023, 8:59 AM

## 2023-04-10 NOTE — Patient Instructions (Addendum)
Thank you for coming to the office today.  Start Metformin XR 500mg  daily WITH Meal, caution upset stomach diarrhea loose stool, hopefully resolves.  Repeat sugar finger prick in 3 months.  Keep monitoring BP  Continue with the lower dose, since the Potassium is now in range.  Please schedule a Follow-up Appointment to: Return in about 3 months (around 07/09/2023), or 3 month DM A1c.  If you have any other questions or concerns, please feel free to call the office or send a message through MyChart. You may also schedule an earlier appointment if necessary.  Additionally, you may be receiving a survey about your experience at our office within a few days to 1 week by e-mail or mail. We value your feedback.  Saralyn Pilar, DO [facility], [place]

## 2023-04-10 NOTE — Assessment & Plan Note (Signed)
Controlled BP, mild elevated but improved. No longer hypotensive. Hypokalemia - resolved off thiazide now Home readings reviewed No known complications except history of PVCs Followed by Cardiology   Plan:  Continue hydrochlorothiazide 12.5mg  daily (instead of 25mg ), Losartan 100mg  daily, Metoprolol 37.5mg  TWICE A DAY, Diltiazem 240mg  daily  2. Encourage improved lifestyle - low sodium diet, continue regular exercise 3. Continue monitor BP outside office, bring readings to next visit, if persistently >140/90 or new symptoms notify office sooner

## 2023-04-24 ENCOUNTER — Other Ambulatory Visit: Payer: Self-pay | Admitting: Cardiovascular Disease

## 2023-07-07 ENCOUNTER — Encounter: Payer: Self-pay | Admitting: Urology

## 2023-07-07 ENCOUNTER — Ambulatory Visit: Payer: Medicare HMO | Admitting: Urology

## 2023-07-07 VITALS — BP 128/63 | HR 72 | Ht 68.0 in | Wt 178.0 lb

## 2023-07-07 DIAGNOSIS — N401 Enlarged prostate with lower urinary tract symptoms: Secondary | ICD-10-CM

## 2023-07-07 LAB — URINALYSIS, COMPLETE
Bilirubin, UA: NEGATIVE
Ketones, UA: NEGATIVE
Leukocytes,UA: NEGATIVE
Nitrite, UA: NEGATIVE
Specific Gravity, UA: 1.025 (ref 1.005–1.030)
Urobilinogen, Ur: 0.2 mg/dL (ref 0.2–1.0)
pH, UA: 6 (ref 5.0–7.5)

## 2023-07-07 LAB — MICROSCOPIC EXAMINATION

## 2023-07-07 LAB — BLADDER SCAN AMB NON-IMAGING

## 2023-07-07 MED ORDER — TAMSULOSIN HCL 0.4 MG PO CAPS: 0.4000 mg | ORAL_CAPSULE | Freq: Every day | ORAL | 3 refills | Status: AC

## 2023-07-07 NOTE — Progress Notes (Signed)
 I, Maysun Anabel Bene, acting as a scribe for Riki Altes, MD., have documented all relevant documentation on the behalf of Riki Altes, MD, as directed by Riki Altes, MD while in the presence of Riki Altes, MD.  07/07/2023 11:26 AM   Kyle Bales Jr. 1940/10/08 [id number]  Referring provider: Smitty Cords, DO [address] [place],  [place] [address]  Chief Complaint  Patient presents with   Follow-up   Urological history:  1. BPH with LUTS Prior patient of Dr. Evelene Croon, established care here 07/2022.  Prior PVP/TRP with Cystothalapaxate 04/2006.  Tamsulosin 0.4 mg daily.   2. Personal history of urinary calculi  Prior ureteroscopic stone removal.  HPI: Kyle Vargas. is a 83 y.o. male presents for annual follow-up.  Doing well since last year's visit. No bothersome LUTS Remains on Tamsulosin.  Denies dysuria, hematuria, or nocturia. No flank, abdominal, or pelvic pain.   PMH: Past Medical History:  Diagnosis Date   Allergic rhinitis, cause unspecified    Diarrhea    Dysrhythmia    Esophageal reflux    H/O diverticulitis of colon    History of kidney stones    PVC's (premature ventricular contractions)    Right ventricular outflow tract    Surgical History: Past Surgical History:  Procedure Laterality Date   BASAL CELL CARCINOMA EXCISION     CATARACT EXTRACTION, BILATERAL  2020   COLONOSCOPY     COLONOSCOPY WITH PROPOFOL N/A 02/08/2017   Procedure: COLONOSCOPY WITH PROPOFOL;  Surgeon: Scot Jun, MD;  Location: [facility] ENDOSCOPY;  Service: Endoscopy;  Laterality: N/A;   REPAIR IMPERFORATE ANUS / ANORECTOPLASTY     TRANSURETHRAL RESECTION OF PROSTATE      Home Medications:  Allergies as of 07/07/2023       Reactions   Dust Mite Extract    eye itching   Other Other (See Comments)   Dogs and cats, itching eyes, sneezing         Medication List        Accurate as of July 07, 2023 11:26 AM. If you have any questions,  ask your nurse or doctor.          aspirin 81 MG tablet Take 81 mg by mouth daily. Takes 5 days a week   azelastine 0.1 % nasal spray Commonly known as: ASTELIN Place 1 spray into both nostrils 2 (two) times daily.   Blood Pressure Cuff Misc 1 Device by Does not apply route daily. Normal upper arm BP cuff   diltiazem 240 MG 24 hr capsule Commonly known as: CARDIZEM CD Take 1 capsule (240 mg total) by mouth every morning.   Garlic 10 MG Caps Take by mouth. Unsure of dose   glucose blood test strip Use as instructed   hydrochlorothiazide 12.5 MG tablet Commonly known as: HYDRODIURIL Take 1 tablet (12.5 mg total) by mouth daily.   ipratropium 0.06 % nasal spray Commonly known as: ATROVENT Place 2 sprays into both nostrils 4 (four) times daily. For up to 5-7 days then stop.   losartan 100 MG tablet Commonly known as: COZAAR Take 1 tablet (100 mg total) by mouth daily.   metFORMIN 500 MG 24 hr tablet Commonly known as: GLUCOPHAGE-XR Take 1 tablet (500 mg total) by mouth daily with supper.   metoprolol tartrate 25 MG tablet Commonly known as: LOPRESSOR Take 1.5 tablets (37.5 mg total) by mouth 2 (two) times daily.   montelukast 10 MG tablet Commonly  known as: SINGULAIR Take 1 tablet (10 mg total) by mouth at bedtime.   NON FORMULARY Vitreous Health Takes 1 capsule daily.   Omega-3 Fish Oil 1200 MG Caps Take 1 capsule (1,200 mg total) by mouth 3 (three) times daily.   rosuvastatin 10 MG tablet Commonly known as: CRESTOR Take 1 tablet (10 mg total) by mouth daily.   Sodium Chloride-Sodium Bicarb 1.57 g Pack Place into the nose as needed.   tamsulosin 0.4 MG Caps capsule Commonly known as: FLOMAX Take 1 capsule (0.4 mg total) by mouth daily.        Allergies:  Allergies  Allergen Reactions   Dust Mite Extract     eye itching   Other Other (See Comments)    Dogs and cats, itching eyes, sneezing     Family History: Family History  Problem  Relation Age of Onset   Hypertension Father     Social History:  reports that he has never smoked. He has never been exposed to tobacco smoke. He has never used smokeless tobacco. He reports current alcohol use. He reports that he does not use drugs.   Physical Exam: BP 128/63   Pulse 72   Ht 5\' 8"  (1.727 m)   Wt 178 lb (80.7 kg)   BMI 27.06 kg/m   Constitutional:  Alert and oriented, No acute distress. HEENT: [place] AT Respiratory: Normal respiratory effort, no increased work of breathing. Psychiatric: Normal mood and affect.   Urinalysis Dipstick 1+ glucose/trace blood/2+ protein, microscopy negative   Assessment & Plan:    1. BPH with LUTS Stable lower urinary tract symptoms on Tamsulosin; refills sent to pharmacy Continue annual follow-up PVR 0 mL.  I have reviewed the above documentation for accuracy and completeness, and I agree with the above.   Riki Altes, MD  [facility] [address] [place], [place] [address] [phone]

## 2023-07-08 ENCOUNTER — Encounter: Payer: Self-pay | Admitting: Urology

## 2023-07-10 ENCOUNTER — Encounter: Payer: Self-pay | Admitting: Family Medicine

## 2023-07-10 ENCOUNTER — Ambulatory Visit: Payer: Medicare HMO | Admitting: Family Medicine

## 2023-07-10 ENCOUNTER — Other Ambulatory Visit: Payer: Self-pay | Admitting: Family Medicine

## 2023-07-10 VITALS — BP 147/74 | HR 79 | Ht 68.0 in | Wt 183.0 lb

## 2023-07-10 DIAGNOSIS — Z1211 Encounter for screening for malignant neoplasm of colon: Secondary | ICD-10-CM

## 2023-07-10 DIAGNOSIS — I10 Essential (primary) hypertension: Secondary | ICD-10-CM

## 2023-07-10 DIAGNOSIS — Z8249 Family history of ischemic heart disease and other diseases of the circulatory system: Secondary | ICD-10-CM

## 2023-07-10 DIAGNOSIS — E1169 Type 2 diabetes mellitus with other specified complication: Secondary | ICD-10-CM | POA: Diagnosis not present

## 2023-07-10 DIAGNOSIS — N138 Other obstructive and reflux uropathy: Secondary | ICD-10-CM

## 2023-07-10 DIAGNOSIS — Z0000 Encounter for general adult medical examination without abnormal findings: Secondary | ICD-10-CM

## 2023-07-10 DIAGNOSIS — Z23 Encounter for immunization: Secondary | ICD-10-CM

## 2023-07-10 DIAGNOSIS — Z7984 Long term (current) use of oral hypoglycemic drugs: Secondary | ICD-10-CM | POA: Diagnosis not present

## 2023-07-10 DIAGNOSIS — E785 Hyperlipidemia, unspecified: Secondary | ICD-10-CM

## 2023-07-10 LAB — POCT GLYCOSYLATED HEMOGLOBIN (HGB A1C): Hemoglobin A1C: 8.4 % — AB (ref 4.0–5.6)

## 2023-07-10 NOTE — Patient Instructions (Addendum)
 Thank you for coming to the office today.  Dose increase Metformin when ready go to one pill twice a day with meal - or if preferred can double up x 2 pills at once with breakfast.  Keep an eye on BP  Ask Dr Kirke Corin about CT Heart Scan - calcium score next visit.  Prevnar-20 pneumonia vaccine today  Abdominal US AAA imaging to be scheduled  Ordered the Cologuard (home kit) test for colon cancer screening. Stay tuned for further updates.  It will be shipped to you directly. If not received in 2-4 weeks, call us or the company.   If you send it back and no results are received in 2-4 weeks, call us or the company as well!   Colon Cancer Screening: - For all adults age 48+ routine colon cancer screening is highly recommended.     - Recent guidelines from American Cancer Society recommend starting age of 47 - Early detection of colon cancer is important, because often there are no warning signs or symptoms, also if found early usually it can be cured. Late stage is hard to treat.   - If Cologuard is NEGATIVE, then it is good for 3 years before next due - If Cologuard is POSITIVE, then it is strongly advised to get a Colonoscopy, which allows the GI doctor to locate the source of the cancer or polyp (even very early stage) and treat it by removing it. ------------------------- Follow instructions to collect sample, you may call the company for any help or questions, 24/7 telephone support at [phone].    DUE for FASTING BLOOD WORK (no food or drink after midnight before the lab appointment, only water or coffee without cream/sugar on the morning of)  SCHEDULE "Lab Only" visit in the morning at the clinic for lab draw in 5 MONTHS   - Make sure Lab Only appointment is at about 1 week before your next appointment, so that results will be available  For Lab Results, once available within 2-3 days of blood draw, you can can log in to MyChart online to view your results and a brief  explanation. Also, we can discuss results at next follow-up visit.   Please schedule a Follow-up Appointment to: Return in about 5 months (around 12/10/2023) for 5 month fasting lab > 1 week later Annual Physical.  If you have any other questions or concerns, please feel free to call the office or send a message through MyChart. You may also schedule an earlier appointment if necessary.  Additionally, you may be receiving a survey about your experience at our office within a few days to 1 week by e-mail or mail. We value your feedback.  Saralyn Pilar, DO [facility], [place]

## 2023-07-10 NOTE — Progress Notes (Signed)
 Subjective:    Patient ID: Kyle Vargas., male    DOB: 1941/02/19, 83 y.o.   MRN: [id number]  Kyle Vargas. is a 83 y.o. male presenting on 07/10/2023 for Diabetes   HPI  Discussed the use of AI scribe software for clinical note transcription with the patient, who gave verbal consent to proceed.  History of Present Illness   Kyle Vargas. "[name]" is an 83 year old male with diabetes who presents for follow-up on blood sugar management and routine health maintenance.  He has a history of diabetes and has been actively monitoring his blood sugar levels. Over the past eight months, there has been a gradual improvement in his blood sugar levels, with recent readings showing an HbA1c of 8.4, down from 8.9 and 8.7. He is currently taking metformin 500 mg extended release once daily and has not experienced significant side effects from it.  His blood pressure readings have been variable, with a recent reading of 152/74, although it was 128 at a previous checkup. He monitors his blood pressure two to three times a week, with typical readings around 130-140.  He has a history of colonoscopy where two tumors were removed, and he has had polyps in the past. It has been six years since his last colonoscopy.  He has a family history of aneurysms, as his father died from a stomach aneurysm. He has undergone screening for aneurysms in the past, with the last ultrasound being a decade ago, which was normal.  He is due for an updated pneumonia vaccine, as his last one was in 2015. He has received flu and COVID vaccines recently.      Type 2 Diabetes A1c down to 8.4 Still elevated overall Interested in medication adjustment CBGs: Average monitored Meds: Metformin XR 500mg  daily tolerating well Currently on ARB Lifestyle: - Diet (goal to improve, reduce sodas and chips) - Exercise (walks 30 min daily 5 x weekly, works in garden, yard, active at home) - Diabetic Eye Exam from Dr  Kyle Vargas Denies hypoglycemia   CHRONIC HTN / Intermittent PVC - controlled. Elevated BP improved at home based on readings Followed by [facility] Cardiology Dr Kirke Corin. Prior work up they checked Renal Artery [place] due to spikes or elevation in BP, results show normal without stenosis. Home BP improved he admits occasional lower readings at time or dizzy. Current Meds - Losartan 100mg , HCTZ 25mg , Metoprolol 37.5mg  twice daily (25mg  tabs, 1.5 per dose), Diltiazem-24 hour 240mg  Reports good compliance, took meds today. Tolerating well, w/o complaints. - Taking ASA 81mg  x 5 days weekly, skips weekends. No known CAD or CVA   HYPERLIPIDEMIA: - Currently taking Rosuvastatin 10mg  nightly, Fish Oil Omega 3, 1200 - taking x 3 a day    Health Maintenance:  Colonoscopy - last done 6 years, negative. Has history of polyps. Interested in Cologuard     07/10/2023    8:08 AM 04/10/2023    8:21 AM 11/08/2022    8:31 AM  Depression screen PHQ 2/9  Decreased Interest 0 0 0  Down, Depressed, Hopeless 0 0 0  PHQ - 2 Score 0 0 0  Altered sleeping 2 2 1   Tired, decreased energy 1 1 1   Change in appetite 0 0 0  Feeling bad or failure about yourself  0 0 0  Trouble concentrating 0 0 0  Moving slowly or fidgety/restless 0 0 0  Suicidal thoughts 0 0 0  PHQ-9 Score 3 3 2   Difficult  doing work/chores Not difficult at all Not difficult at all Not difficult at all       07/10/2023    8:08 AM 04/10/2023    8:22 AM 11/08/2022    8:32 AM 04/27/2022   11:55 AM  GAD 7 : Generalized Anxiety Score  Nervous, Anxious, on Edge 0 0 0 0  Control/stop worrying 0 0 0 0  Worry too much - different things 0 0 0 0  Trouble relaxing 0 0 0 0  Restless 0 0 0 0  Easily annoyed or irritable 0 0 0 0  Afraid - awful might happen 0 0 0 0  Total GAD 7 Score 0 0 0 0  Anxiety Difficulty  Not difficult at all Not difficult at all Not difficult at all    Social History   Tobacco Use   Smoking status: Never    Passive exposure: Never    Smokeless tobacco: Never  Vaping Use   Vaping status: Never Used  Substance Use Topics   Alcohol use: Yes    Comment: occasionally  2 glasses of wine a month    Drug use: No    Review of Systems Per HPI unless specifically indicated above     Objective:    BP (!) 147/74 (BP Location: Left Arm, Cuff Size: Normal)   Pulse 79   Ht 5\' 8"  (1.727 m)   Wt 183 lb (83 kg)   SpO2 97%   BMI 27.83 kg/m   Wt Readings from Last 3 Encounters:  07/10/23 183 lb (83 kg)  07/07/23 178 lb (80.7 kg)  04/10/23 180 lb 3.2 oz (81.7 kg)    Physical Exam Vitals and nursing note reviewed.  Constitutional:      General: He is not in acute distress.    Appearance: He is well-developed. He is not diaphoretic.     Comments: Well-appearing, comfortable, cooperative  HENT:     Head: Normocephalic and atraumatic.  Eyes:     General:        Right eye: No discharge.        Left eye: No discharge.     Conjunctiva/sclera: Conjunctivae normal.  Neck:     Thyroid: No thyromegaly.  Cardiovascular:     Rate and Rhythm: Normal rate and regular rhythm.     Pulses: Normal pulses.     Heart sounds: Normal heart sounds. No murmur heard. Pulmonary:     Effort: Pulmonary effort is normal. No respiratory distress.     Breath sounds: Normal breath sounds. No wheezing or rales.  Musculoskeletal:        General: Normal range of motion.     Cervical back: Normal range of motion and neck supple.  Lymphadenopathy:     Cervical: No cervical adenopathy.  Skin:    General: Skin is warm and dry.     Findings: No erythema or rash.  Neurological:     Mental Status: He is alert and oriented to person, place, and time. Mental status is at baseline.  Psychiatric:        Behavior: Behavior normal.     Comments: Well groomed, good eye contact, normal speech and thoughts     Results for orders placed or performed in visit on 07/10/23  POCT HgB A1C   Collection Time: 07/10/23  8:08 AM  Result Value Ref Range    Hemoglobin A1C 8.4 (A) 4.0 - 5.6 %   HbA1c POC (<> result, manual entry)     HbA1c, POC (  prediabetic range)     HbA1c, POC (controlled diabetic range)        Assessment & Plan:   Problem List Items Addressed This Visit     Essential hypertension   Type 2 diabetes mellitus with other specified complication (HCC) - Primary   Relevant Medications   metFORMIN (GLUCOPHAGE-XR) 500 MG 24 hr tablet   Other Relevant Orders   POCT HgB A1C (Completed)   Other Visit Diagnoses       Screening for colon cancer       Relevant Orders   Cologuard     Family history of abdominal aortic aneurysm (AAA)       Relevant Orders   [place] Retroperitoneal Comp     Need for Streptococcus pneumoniae vaccination       Relevant Orders   Pneumococcal conjugate vaccine 20-valent (Completed)         Type 2 Diabetes Mellitus Gradual improvement in HbA1c over the past 8 months (8.9 to 8.4). Glycosuria noted on recent visit with Dr. Lonna Cobb.  Currently on Metformin XR 500mg  daily. -Increase Metformin XR to 500mg  twice daily for better glycemic control. -Continue monitoring blood glucose levels.  Colon Cancer Screening Last colonoscopy 6 years ago with removal of two tumors. Patient received a letter for screening and is interested in non-invasive options. He was advised age 24+ no longer required. But he prefers to do some screening. -Order Cologuard home test kit.  Abdominal Aortic Aneurysm (AAA) Screening Family history of AAA. Last ultrasound screening was in 2015. -Order abdominal ultrasound for AAA screening.  Pneumonia Vaccination Last vaccination was in 2015. -Administer Prevnar 20 pneumonia vaccine today.  Prostate Cancer Screening Urology not checking PSA. They advised not required. He prefers to check it. No recent PSA test. -Order PSA test for next visit.  Hypertension Blood pressure measured at 152/74 today, higher than usual readings around 130-140. -Continue monitoring blood  pressure.  Cardiovascular Health Patient has irregular heartbeat monitored by Dr. Kirke Corin. Patient interested in CT heart scan. He will follow up with Cardiology and consider arranging CT screening  Follow-up Next annual check-up in August 2025. -Order blood work 1 week prior to next visit.         Orders Placed This Encounter  Procedures   [place] Retroperitoneal Comp    Standing Status:   Future    Expiration Date:   10/10/2023    Reason for Exam (SYMPTOM  OR DIAGNOSIS REQUIRED):   Screening for AAA with strong family history father, he was advised to have repeat screening. last 2015    Preferred imaging location?:   [facility]   Pneumococcal conjugate vaccine 20-valent   Cologuard   POCT HgB A1C    No orders of the defined types were placed in this encounter.   Follow up plan: Return in about 5 months (around 12/10/2023) for 5 month fasting lab > 1 week later Annual Physical.  Future labs ordered for 12/11/23 add PSA   Saralyn Pilar, DO [facility] 07/10/2023, 8:13 AM

## 2023-07-11 ENCOUNTER — Other Ambulatory Visit: Payer: Self-pay | Admitting: Family Medicine

## 2023-07-11 DIAGNOSIS — Z8249 Family history of ischemic heart disease and other diseases of the circulatory system: Secondary | ICD-10-CM

## 2023-07-13 ENCOUNTER — Ambulatory Visit
Admission: RE | Admit: 2023-07-13 | Discharge: 2023-07-13 | Disposition: A | Discharge status: Home / Self Care | Source: Ambulatory Visit | Attending: Family Medicine | Admitting: Family Medicine

## 2023-07-13 DIAGNOSIS — Z8249 Family history of ischemic heart disease and other diseases of the circulatory system: Secondary | ICD-10-CM | POA: Diagnosis present

## 2023-07-13 DIAGNOSIS — I714 Abdominal aortic aneurysm, without rupture, unspecified: Secondary | ICD-10-CM | POA: Diagnosis not present

## 2023-07-13 DIAGNOSIS — Z136 Encounter for screening for cardiovascular disorders: Secondary | ICD-10-CM | POA: Insufficient documentation

## 2023-07-13 DIAGNOSIS — I723 Aneurysm of iliac artery: Secondary | ICD-10-CM | POA: Diagnosis not present

## 2023-07-16 DIAGNOSIS — Z1211 Encounter for screening for malignant neoplasm of colon: Secondary | ICD-10-CM | POA: Diagnosis not present

## 2023-07-18 ENCOUNTER — Other Ambulatory Visit: Payer: Self-pay | Admitting: Family Medicine

## 2023-07-18 DIAGNOSIS — J3089 Other allergic rhinitis: Secondary | ICD-10-CM

## 2023-07-19 NOTE — Telephone Encounter (Signed)
 Requested Prescriptions  Pending Prescriptions Disp Refills   montelukast (SINGULAIR) 10 MG tablet [Pharmacy Med Name: MONTELUKAST SOD 10 MG TABLET] 90 tablet 1    Sig: Take 1 tablet (10 mg total) by mouth at bedtime.     Pulmonology:  Leukotriene Inhibitors Passed - 07/19/2023  8:56 AM      Passed - Valid encounter within last 12 months    Recent Outpatient Visits           3 months ago Type 2 diabetes mellitus with other specified complication, without long-term current use of insulin [facility])   [place] [facility] [place], Netta Neat, DO   8 months ago Annual physical exam   [facility] Smitty Cords, DO   1 year ago Type 2 diabetes mellitus with other specified complication, without long-term current use of insulin [facility])   [place] [facility] Smitty Cords, DO   1 year ago Annual physical exam   [place] [facility] Smitty Cords, DO   2 years ago Pain and swelling of right lower leg   [place] [facility] Smitty Cords, DO       Future Appointments             In 11 months Stoioff, Verna Czech, MD [facility]rology [facility]

## 2023-07-21 ENCOUNTER — Telehealth: Payer: Self-pay

## 2023-07-21 NOTE — Telephone Encounter (Signed)
 Patient notified that results for routine tests can take a little longer to get. Verbal understanding

## 2023-07-21 NOTE — Telephone Encounter (Signed)
 Copied from CRM [id number]. Topic: Clinical - Lab/Test Results >> Jul 21, 2023  9:36 AM Nyra Capes wrote: Reason for CRM: Patient is asking for Ultrasound results taken on 3/6   Patients phone # [phone] ok to leave a detailed message.

## 2023-07-21 NOTE — Telephone Encounter (Signed)
 Result is not available yet in the system. We will contact him once it results. It is a "screening" test. It is not one that was done for significant symptoms, so these routine results may take longer.  Saralyn Pilar, DO [facility] 07/21/2023, 10:58 AM

## 2023-07-22 LAB — COLOGUARD: COLOGUARD: POSITIVE — AB

## 2023-07-24 ENCOUNTER — Encounter: Payer: Self-pay | Admitting: Family Medicine

## 2023-07-24 ENCOUNTER — Other Ambulatory Visit: Payer: Self-pay | Admitting: Family Medicine

## 2023-07-24 DIAGNOSIS — R195 Other fecal abnormalities: Secondary | ICD-10-CM

## 2023-08-09 DIAGNOSIS — R195 Other fecal abnormalities: Secondary | ICD-10-CM | POA: Diagnosis not present

## 2023-08-09 DIAGNOSIS — Z860101 Personal history of adenomatous and serrated colon polyps: Secondary | ICD-10-CM | POA: Diagnosis not present

## 2023-08-24 ENCOUNTER — Encounter: Payer: Self-pay | Admitting: Gastroenterology

## 2023-09-07 ENCOUNTER — Encounter
Admission: RE | Disposition: A | Discharge status: Home / Self Care | Payer: Self-pay | Source: Home / Self Care | Attending: Gastroenterology

## 2023-09-07 ENCOUNTER — Ambulatory Visit
Admission: RE | Admit: 2023-09-07 | Discharge: 2023-09-07 | Disposition: A | Discharge status: Home / Self Care | Attending: Gastroenterology | Admitting: Gastroenterology

## 2023-09-07 ENCOUNTER — Ambulatory Visit: Admitting: Anesthesiology

## 2023-09-07 ENCOUNTER — Encounter: Payer: Self-pay | Admitting: Gastroenterology

## 2023-09-07 DIAGNOSIS — Z1211 Encounter for screening for malignant neoplasm of colon: Secondary | ICD-10-CM | POA: Diagnosis not present

## 2023-09-07 DIAGNOSIS — R195 Other fecal abnormalities: Secondary | ICD-10-CM | POA: Diagnosis not present

## 2023-09-07 DIAGNOSIS — R197 Diarrhea, unspecified: Secondary | ICD-10-CM | POA: Diagnosis not present

## 2023-09-07 DIAGNOSIS — Z09 Encounter for follow-up examination after completed treatment for conditions other than malignant neoplasm: Secondary | ICD-10-CM | POA: Diagnosis not present

## 2023-09-07 DIAGNOSIS — K649 Unspecified hemorrhoids: Secondary | ICD-10-CM | POA: Diagnosis not present

## 2023-09-07 DIAGNOSIS — K641 Second degree hemorrhoids: Secondary | ICD-10-CM | POA: Insufficient documentation

## 2023-09-07 DIAGNOSIS — K5730 Diverticulosis of large intestine without perforation or abscess without bleeding: Secondary | ICD-10-CM | POA: Insufficient documentation

## 2023-09-07 DIAGNOSIS — K621 Rectal polyp: Secondary | ICD-10-CM | POA: Insufficient documentation

## 2023-09-07 DIAGNOSIS — I10 Essential (primary) hypertension: Secondary | ICD-10-CM | POA: Diagnosis not present

## 2023-09-07 DIAGNOSIS — D125 Benign neoplasm of sigmoid colon: Secondary | ICD-10-CM | POA: Insufficient documentation

## 2023-09-07 DIAGNOSIS — K611 Rectal abscess: Secondary | ICD-10-CM | POA: Diagnosis not present

## 2023-09-07 DIAGNOSIS — Z87891 Personal history of nicotine dependence: Secondary | ICD-10-CM | POA: Diagnosis not present

## 2023-09-07 DIAGNOSIS — K635 Polyp of colon: Secondary | ICD-10-CM | POA: Diagnosis not present

## 2023-09-07 DIAGNOSIS — Z980 Intestinal bypass and anastomosis status: Secondary | ICD-10-CM | POA: Diagnosis not present

## 2023-09-07 DIAGNOSIS — Z860101 Personal history of adenomatous and serrated colon polyps: Secondary | ICD-10-CM | POA: Diagnosis not present

## 2023-09-07 DIAGNOSIS — D126 Benign neoplasm of colon, unspecified: Secondary | ICD-10-CM | POA: Diagnosis not present

## 2023-09-07 HISTORY — DX: Other specified postprocedural states: Z98.890

## 2023-09-07 HISTORY — DX: Nausea with vomiting, unspecified: R11.2

## 2023-09-07 HISTORY — PX: COLONOSCOPY: SHX5424

## 2023-09-07 HISTORY — PX: POLYPECTOMY: SHX149

## 2023-09-07 SURGERY — COLONOSCOPY
Anesthesia: General

## 2023-09-07 MED ORDER — SODIUM CHLORIDE 0.9 % IV SOLN: INTRAVENOUS | Status: DC

## 2023-09-07 MED ORDER — ONDANSETRON HCL 4 MG/2ML IJ SOLN
INTRAMUSCULAR | Status: DC | PRN
  Administered 2023-09-07: 4 mg via INTRAVENOUS

## 2023-09-07 MED ORDER — LIDOCAINE HCL (CARDIAC) PF 100 MG/5ML IV SOSY
PREFILLED_SYRINGE | INTRAVENOUS | Status: DC | PRN
  Administered 2023-09-07: 70 mg via INTRAVENOUS

## 2023-09-07 MED ORDER — PROPOFOL 500 MG/50ML IV EMUL
INTRAVENOUS | Status: DC | PRN
  Administered 2023-09-07: 100 ug/kg/min via INTRAVENOUS

## 2023-09-07 MED ORDER — PROPOFOL 1000 MG/100ML IV EMUL
INTRAVENOUS | Status: AC
  Filled 2023-09-07: qty 100

## 2023-09-07 MED ORDER — PROPOFOL 10 MG/ML IV BOLUS
INTRAVENOUS | Status: DC | PRN
  Administered 2023-09-07: 60 mg via INTRAVENOUS

## 2023-09-07 MED ORDER — ONDANSETRON HCL 4 MG/2ML IJ SOLN
INTRAMUSCULAR | Status: AC
Start: 2023-09-07 — End: ?
  Filled 2023-09-07: qty 2

## 2023-09-07 MED ORDER — EPHEDRINE SULFATE-NACL 50-0.9 MG/10ML-% IV SOSY
PREFILLED_SYRINGE | INTRAVENOUS | Status: DC | PRN
  Administered 2023-09-07: 5 mg via INTRAVENOUS
  Administered 2023-09-07: 10 mg via INTRAVENOUS
  Administered 2023-09-07: 5 mg via INTRAVENOUS

## 2023-09-07 MED ORDER — LIDOCAINE HCL (PF) 2 % IJ SOLN
INTRAMUSCULAR | Status: AC
  Filled 2023-09-07: qty 5

## 2023-09-07 MED ORDER — EPHEDRINE 5 MG/ML INJ
INTRAVENOUS | Status: AC
Start: 2023-09-07 — End: ?
  Filled 2023-09-07: qty 5

## 2023-09-07 NOTE — Transfer of Care (Signed)
 Immediate Anesthesia Transfer of Care Note  Patient: Kyle Even Blecher Jr.  Procedure(s) Performed: COLONOSCOPY POLYPECTOMY, INTESTINE  Patient Location: PACU and Endoscopy Unit  Anesthesia Type:General  Level of Consciousness: awake  Airway & Oxygen Therapy: Patient Spontanous Breathing  Post-op Assessment: Report given to RN and Post -op Vital signs reviewed and stable  Post vital signs: Reviewed and stable  Last Vitals:  Vitals Value Taken Time  BP 89/54 09/07/23 0922  Temp 35.4 C 09/07/23 0920  Pulse 56 09/07/23 0922  Resp 18 09/07/23 0920  SpO2 100 % 09/07/23 0922    Last Pain:  Vitals:   09/07/23 0920  TempSrc: Temporal  PainSc: 0-No pain         Complications: No notable events documented.

## 2023-09-07 NOTE — Op Note (Signed)
 [facility] Gastroenterology Patient Name: Kyle Vargas Procedure Date: 09/07/2023 8:42 AM MRN: [id number] Account #: [id number] Date of Birth: July 09, 1940 Admit Type: Outpatient Age: 83 Room: [facility] ENDO ROOM 1 Gender: Male Note Status: Supervisor Override Instrument Name: Colonscope [id number] Procedure:             Colonoscopy Indications:           Follow-up for history of colon polyps, Positive                         Cologuard test Providers:             [name] DO, DO Referring MD:          Raina Bunting (Referring MD) Medicines:             Monitored Anesthesia Care Complications:         No immediate complications. Estimated blood loss:                         Minimal. Procedure:             Pre-Anesthesia Assessment:                        - Prior to the procedure, a History and Physical was                         performed, and patient medications and allergies were                         reviewed. The patient is competent. The risks and                         benefits of the procedure and the sedation options and                         risks were discussed with the patient. All questions                         were answered and informed consent was obtained.                         Patient identification and proposed procedure were                         verified by the physician, the nurse, the anesthetist                         and the technician in the endoscopy suite. Mental                         Status Examination: alert and oriented. Airway                         Examination: normal oropharyngeal airway and neck                         mobility. Respiratory Examination: clear to  auscultation. CV Examination: RRR, no murmurs, no S3                         or S4. Prophylactic Antibiotics: The patient does not                         require prophylactic antibiotics. Prior                          Anticoagulants: The patient has taken no anticoagulant                         or antiplatelet agents. ASA Grade Assessment: II - A                         patient with mild systemic disease. After reviewing                         the risks and benefits, the patient was deemed in                         satisfactory condition to undergo the procedure. The                         anesthesia plan was to use monitored anesthesia care                         (MAC). Immediately prior to administration of                         medications, the patient was re-assessed for adequacy                         to receive sedatives. The heart rate, respiratory                         rate, oxygen saturations, blood pressure, adequacy of                         pulmonary ventilation, and response to care were                         monitored throughout the procedure. The physical                         status of the patient was re-assessed after the                         procedure.                        After obtaining informed consent, the colonoscope was                         passed under direct vision. Throughout the procedure,                         the patient's blood pressure, pulse, and oxygen  saturations were monitored continuously. The                         Colonoscope was introduced through the anus and                         advanced to the the ileocolonic anastomosis. The                         colonoscopy was performed without difficulty. The                         patient tolerated the procedure well. The quality of                         the bowel preparation was good. The appendiceal                         orifice, iliocolonic anastamosis, and the rectum were                         photographed. Findings:      The perianal and digital rectal examinations were normal. Pertinent       negatives include normal sphincter tone.      Multiple  small-mouthed diverticula were found in the entire colon.       Estimated blood loss: none.      Non-bleeding internal hemorrhoids were found during retroflexion. The       hemorrhoids were Grade II (internal hemorrhoids that prolapse but reduce       spontaneously). Estimated blood loss: none.      Two sessile polyps were found in the rectum and sigmoid colon. The       polyps were 1 to 2 mm in size. These polyps were removed with a jumbo       cold forceps. Resection and retrieval were complete. Estimated blood       loss was minimal.      There was evidence of a prior end-to-side ileo-colonic anastomosis in       the ascending colon. This was patent and was characterized by healthy       appearing mucosa. The anastomosis was traversed. Estimated blood loss:       none.      The neo-terminal ileum appeared normal. Estimated blood loss: none.      A 3 to 4 mm polyp was found in the anastomosis. The polyp was sessile.       The polyp was removed with a cold snare. Resection and retrieval were       complete. Estimated blood loss was minimal.      A 13 to 15 mm polyp was found in the sigmoid colon. The polyp was       pedunculated. The polyp was removed with a hot snare. Resection and       retrieval were complete. Estimated blood loss was minimal.      The exam was otherwise without abnormality on direct and retroflexion       views. Impression:            - Diverticulosis in the entire examined colon.                        -  Non-bleeding internal hemorrhoids.                        - Two 1 to 2 mm polyps in the rectum and in the                         sigmoid colon, removed with a jumbo cold forceps.                         Resected and retrieved.                        - Patent end-to-side ileo-colonic anastomosis,                         characterized by healthy appearing mucosa.                        - The examined portion of the ileum was normal.                        - One 3  to 4 mm polyp at the anastomosis, removed with                         a cold snare. Resected and retrieved.                        - One 13 to 15 mm polyp in the sigmoid colon, removed                         with a hot snare. Resected and retrieved.                        - The examination was otherwise normal on direct and                         retroflexion views. Recommendation:        - Patient has a contact number available for                         emergencies. The signs and symptoms of potential                         delayed complications were discussed with the patient.                         Return to normal activities tomorrow. Written                         discharge instructions were provided to the patient.                        - Discharge patient to home.                        - Resume previous diet.                        - Continue present medications.                        -  No ibuprofen, naproxen , or other non-steroidal                         anti-inflammatory drugs for 5 days after polyp removal.                        - Await pathology results.                        - Repeat colonoscopy for surveillance based on                         pathology results.                        - Return to referring physician as previously                         scheduled.                        - No further cologuard/stool tests to be given patient                         personal history and most recent colonsocopy.                         Colonoscopy the recommended test.                        - The findings and recommendations were discussed with                         the patient. Procedure Code(s):     --- Professional ---                        [phone], Colonoscopy, flexible; with removal of                         tumor(s), polyp(s), or other lesion(s) by snare                         technique                        45380, 59, Colonoscopy, flexible; with  biopsy, single                         or multiple Diagnosis Code(s):     --- Professional ---                        K64.1, Second degree hemorrhoids                        D12.8, Benign neoplasm of rectum                        D12.5, Benign neoplasm of sigmoid colon                        Z98.0, Intestinal bypass and anastomosis status  R19.5, Other fecal abnormalities                        K57.30, Diverticulosis of large intestine without                         perforation or abscess without bleeding CPT copyright 2022 American Medical Association. All rights reserved. The codes documented in this report are preliminary and upon coder review may  be revised to meet current compliance requirements. Attending Participation:      I personally performed the entire procedure. Polo Brisk, DO [name] DO, DO 09/07/2023 9:25:18 AM This report has been signed electronically. Number of Addenda: 0 Note Initiated On: 09/07/2023 8:42 AM Scope Withdrawal Time: 0 hours 15 minutes 40 seconds  Total Procedure Duration: 0 hours 18 minutes 10 seconds  Estimated Blood Loss:  Estimated blood loss was minimal.      [facility]

## 2023-09-07 NOTE — Anesthesia Preprocedure Evaluation (Signed)
 Anesthesia Evaluation  Patient identified by MRN, date of birth, ID band Patient awake    Reviewed: Allergy & Precautions, NPO status , Patient's Chart, lab work & pertinent test results  History of Anesthesia Complications (+) PONV, PROLONGED EMERGENCE and history of anesthetic complications  Airway Mallampati: III  TM Distance: <3 FB Neck ROM: full    Dental  (+) Chipped   Pulmonary neg pulmonary ROS, former smoker   Pulmonary exam normal        Cardiovascular hypertension, Normal cardiovascular exam+ dysrhythmias      Neuro/Psych negative neurological ROS  negative psych ROS   GI/Hepatic Neg liver ROS,GERD  Controlled,,  Endo/Other  diabetes    Renal/GU negative Renal ROS  negative genitourinary   Musculoskeletal   Abdominal   Peds  Hematology negative hematology ROS (+)   Anesthesia Other Findings Past Medical History: No date: Allergic rhinitis, cause unspecified No date: Diarrhea No date: Dysrhythmia No date: Esophageal reflux No date: H/O diverticulitis of colon No date: History of kidney stones No date: PONV (postoperative nausea and vomiting) No date: PVC's (premature ventricular contractions)     Comment:  Right ventricular outflow tract  Past Surgical History: No date: BASAL CELL CARCINOMA EXCISION 2020: CATARACT EXTRACTION, BILATERAL No date: COLONOSCOPY 02/08/2017: COLONOSCOPY WITH PROPOFOL ; N/A     Comment:  Procedure: COLONOSCOPY WITH PROPOFOL ;  Surgeon: Cassie Click, MD;  Location: [facility] ENDOSCOPY;  Service:               Endoscopy;  Laterality: N/A; No date: EYE SURGERY No date: FLEXIBLE SIGMOIDOSCOPY No date: REPAIR IMPERFORATE ANUS / ANORECTOPLASTY No date: TRANSURETHRAL RESECTION OF PROSTATE  BMI    Body Mass Index: 25.85 kg/m      Reproductive/Obstetrics negative OB ROS                             Anesthesia Physical Anesthesia  Plan  ASA: 3  Anesthesia Plan: General   Post-op Pain Management:    Induction: Intravenous  PONV Risk Score and Plan: Propofol  infusion and TIVA  Airway Management Planned: Natural Airway and Nasal Cannula  Additional Equipment:   Intra-op Plan:   Post-operative Plan:   Informed Consent: I have reviewed the patients History and Physical, chart, labs and discussed the procedure including the risks, benefits and alternatives for the proposed anesthesia with the patient or authorized representative who has indicated his/her understanding and acceptance.     Dental Advisory Given  Plan Discussed with: Anesthesiologist, CRNA and Surgeon  Anesthesia Plan Comments: (Patient consented for risks of anesthesia including but not limited to:  - adverse reactions to medications - risk of airway placement if required - damage to eyes, teeth, lips or other oral mucosa - nerve damage due to positioning  - sore throat or hoarseness - Damage to heart, brain, nerves, lungs, other parts of body or loss of life  Patient voiced understanding and assent.)       Anesthesia Quick Evaluation

## 2023-09-07 NOTE — Anesthesia Postprocedure Evaluation (Signed)
 Anesthesia Post Note  Patient: Kyle G Liotta Jr.  Procedure(s) Performed: COLONOSCOPY POLYPECTOMY, INTESTINE  Patient location during evaluation: Endoscopy Anesthesia Type: General Level of consciousness: awake and alert Pain management: pain level controlled Vital Signs Assessment: post-procedure vital signs reviewed and stable Respiratory status: spontaneous breathing, nonlabored ventilation, respiratory function stable and patient connected to nasal cannula oxygen Cardiovascular status: blood pressure returned to baseline and stable Postop Assessment: no apparent nausea or vomiting Anesthetic complications: no   No notable events documented.   Last Vitals:  Vitals:   09/07/23 0929 09/07/23 0939  BP: (!) 109/55 120/60  Pulse:  63  Resp:  14  Temp:    SpO2:  99%    Last Pain:  Vitals:   09/07/23 0939  TempSrc:   PainSc: 0-No pain                 Portia Brittle Isauro Skelley

## 2023-09-07 NOTE — Interval H&P Note (Signed)
 History and Physical Interval Note: Preprocedure H&P from 09/07/23  was reviewed and there was no interval change after seeing and examining the patient.  Written consent was obtained from the patient after discussion of risks, benefits, and alternatives. Patient has consented to proceed with Colonoscopy with possible intervention   09/07/2023 8:47 AM  Kyle Vargas.  has presented today for surgery, with the diagnosis of Positive colorectal cancer screening using Cologuard test (R19.5) Hx of adenomatous colonic polyps (Z86.0101).  The various methods of treatment have been discussed with the patient and family. After consideration of risks, benefits and other options for treatment, the patient has consented to  Procedure(s): COLONOSCOPY (N/A) as a surgical intervention.  The patient's history has been reviewed, patient examined, no change in status, stable for surgery.  I have reviewed the patient's chart and labs.  Questions were answered to the patient's satisfaction.     [name]

## 2023-09-07 NOTE — H&P (Signed)
 Pre-Procedure H&P   Patient ID: Kyle Vargas. is a 83 y.o. male.  Gastroenterology Provider: [name], DO  Referring Provider: Dr. Romeo Co PCP: Raina Bunting, DO  Date: 09/07/2023  HPI [name]. is a 83 y.o. male who presents today for Colonoscopy for Positive Cologuard.  Test + March 2025  Patient with a history of advanced colon polyps.  Large tubulovillous adenoma removed in 2005 via surgery per patient.  He has since undergone colonoscopies in 2018 and 2013 and 2008.  2018 with a small tubular adenoma and internal hemorrhoids.  No family history of colon cancer or colon polyps.  Patient denies any current GI symptoms   Past Medical History:  Diagnosis Date   Allergic rhinitis, cause unspecified    Diarrhea    Dysrhythmia    Esophageal reflux    H/O diverticulitis of colon    History of kidney stones    PONV (postoperative nausea and vomiting)    PVC's (premature ventricular contractions)    Right ventricular outflow tract    Past Surgical History:  Procedure Laterality Date   BASAL CELL CARCINOMA EXCISION     CATARACT EXTRACTION, BILATERAL  2020   COLONOSCOPY     COLONOSCOPY WITH PROPOFOL  N/A 02/08/2017   Procedure: COLONOSCOPY WITH PROPOFOL ;  Surgeon: Cassie Click, MD;  Location: [facility] ENDOSCOPY;  Service: Endoscopy;  Laterality: N/A;   EYE SURGERY     FLEXIBLE SIGMOIDOSCOPY     REPAIR IMPERFORATE ANUS / ANORECTOPLASTY     TRANSURETHRAL RESECTION OF PROSTATE      Family History No h/o GI disease or malignancy  Review of Systems  Constitutional:  Negative for activity change, appetite change, chills, diaphoresis, fatigue, fever and unexpected weight change.  HENT:  Negative for trouble swallowing and voice change.   Respiratory:  Negative for shortness of breath and wheezing.   Cardiovascular:  Negative for chest pain, palpitations and leg swelling.  Gastrointestinal:  Negative for abdominal distention,  abdominal pain, anal bleeding, blood in stool, constipation, diarrhea, nausea and vomiting.  Musculoskeletal:  Negative for arthralgias and myalgias.  Skin:  Negative for color change and pallor.  Neurological:  Negative for dizziness, syncope and weakness.  Psychiatric/Behavioral:  Negative for confusion. The patient is not nervous/anxious.   All other systems reviewed and are negative.    Medications No current facility-administered medications on file prior to encounter.   Current Outpatient Medications on File Prior to Encounter  Medication Sig Dispense Refill   aspirin 81 MG tablet Take 81 mg by mouth daily. Takes 5 days a week     diltiazem  (CARDIZEM  CD) 240 MG 24 hr capsule Take 1 capsule (240 mg total) by mouth every morning. 90 capsule 3   Garlic 10 MG CAPS Take by mouth. Unsure of dose     hydrochlorothiazide  (HYDRODIURIL ) 12.5 MG tablet Take 1 tablet (12.5 mg total) by mouth daily. 90 tablet 3   losartan  (COZAAR ) 100 MG tablet Take 1 tablet (100 mg total) by mouth daily. 90 tablet 3   metoprolol  tartrate (LOPRESSOR ) 25 MG tablet Take 1.5 tablets (37.5 mg total) by mouth 2 (two) times daily. 270 tablet 3   Omega-3 Fatty Acids (OMEGA-3 FISH OIL ) 1200 MG CAPS Take 1 capsule (1,200 mg total) by mouth 3 (three) times daily. 90 capsule 0   Sodium Chloride -Sodium Bicarb 1.57 g PACK Place into the nose as needed.     tamsulosin  (FLOMAX ) 0.4 MG CAPS capsule Take 1 capsule (  0.4 mg total) by mouth daily. 90 capsule 3   azelastine (ASTELIN) 0.1 % nasal spray Place 1 spray into both nostrils 2 (two) times daily.      Blood Pressure Monitoring (BLOOD PRESSURE CUFF) MISC 1 Device by Does not apply route daily. Normal upper arm BP cuff 1 each 0   glucose blood test strip Use as instructed 100 each 12   ipratropium (ATROVENT ) 0.06 % nasal spray Place 2 sprays into both nostrils 4 (four) times daily. For up to 5-7 days then stop. 15 mL 0   metFORMIN  (GLUCOPHAGE -XR) 500 MG 24 hr tablet Take 1  tablet (500 mg total) by mouth 2 (two) times daily with a meal.     montelukast  (SINGULAIR ) 10 MG tablet Take 1 tablet (10 mg total) by mouth at bedtime. 90 tablet 1   NON FORMULARY Vitreous Health Takes 1 capsule daily.     rosuvastatin  (CRESTOR ) 10 MG tablet Take 1 tablet (10 mg total) by mouth daily. 90 tablet 3    Pertinent medications related to GI and procedure were reviewed by me with the patient prior to the procedure   Current Facility-Administered Medications:    0.9 %  sodium chloride  infusion, , Intravenous, Continuous, [name], DO  sodium chloride          Allergies  Allergen Reactions   Dust Mite Extract     eye itching   Other Other (See Comments)    Dogs and cats, itching eyes, sneezing    Allergies were reviewed by me prior to the procedure  Objective   Body mass index is 25.85 kg/m. Vitals:   09/07/23 0829 09/07/23 0835  BP:  (!) 104/59  Pulse:  68  Resp:  17  Temp:  (!) 96.8 F (36 C)  TempSrc:  Temporal  SpO2:  98%  Weight: 77.1 kg 77.1 kg  Height:  5\' 8"  (1.727 m)     Physical Exam Vitals and nursing note reviewed.  Constitutional:      General: He is not in acute distress.    Appearance: Normal appearance. He is not ill-appearing, toxic-appearing or diaphoretic.  HENT:     Head: Normocephalic and atraumatic.     Nose: Nose normal.     Mouth/Throat:     Mouth: Mucous membranes are moist.     Pharynx: Oropharynx is clear.  Eyes:     General: No scleral icterus.    Extraocular Movements: Extraocular movements intact.  Cardiovascular:     Rate and Rhythm: Normal rate and regular rhythm.     Heart sounds: Normal heart sounds. No murmur heard.    No friction rub. No gallop.  Pulmonary:     Effort: Pulmonary effort is normal. No respiratory distress.     Breath sounds: Normal breath sounds. No wheezing, rhonchi or rales.  Abdominal:     General: Bowel sounds are normal. There is no distension.     Palpations: Abdomen is  soft.     Tenderness: There is no abdominal tenderness. There is no guarding or rebound.  Musculoskeletal:     Cervical back: Neck supple.     Right lower leg: No edema.     Left lower leg: No edema.  Skin:    General: Skin is warm and dry.     Coloration: Skin is not jaundiced or pale.  Neurological:     General: No focal deficit present.     Mental Status: He is alert and oriented to person, place, and  time. Mental status is at baseline.  Psychiatric:        Mood and Affect: Mood normal.        Behavior: Behavior normal.        Thought Content: Thought content normal.        Judgment: Judgment normal.      Assessment:  [name]. is a 83 y.o. male  who presents today for Colonoscopy for Positive Cologuard .  Plan:  Colonoscopy with possible intervention today  Colonoscopy with possible biopsy, control of bleeding, polypectomy, and interventions as necessary has been discussed with the patient/patient representative. Informed consent was obtained from the patient/patient representative after explaining the indication, nature, and risks of the procedure including but not limited to death, bleeding, perforation, missed neoplasm/lesions, cardiorespiratory compromise, and reaction to medications. Opportunity for questions was given and appropriate answers were provided. Patient/patient representative has verbalized understanding is amenable to undergoing the procedure.   [name], DO  [facility]astroenter[facility]  Portions of the record may have been created with voice recognition software. Occasional wrong-word or 'sound-a-like' substitutions may have occurred due to the inherent limitations of voice recognition software.  Read the chart carefully and recognize, using context, where substitutions may have occurred.

## 2023-09-08 LAB — SURGICAL PATHOLOGY

## 2023-09-21 DIAGNOSIS — D2272 Melanocytic nevi of left lower limb, including hip: Secondary | ICD-10-CM | POA: Diagnosis not present

## 2023-09-21 DIAGNOSIS — D0439 Carcinoma in situ of skin of other parts of face: Secondary | ICD-10-CM | POA: Diagnosis not present

## 2023-09-21 DIAGNOSIS — D2262 Melanocytic nevi of left upper limb, including shoulder: Secondary | ICD-10-CM | POA: Diagnosis not present

## 2023-09-21 DIAGNOSIS — Z85828 Personal history of other malignant neoplasm of skin: Secondary | ICD-10-CM | POA: Diagnosis not present

## 2023-09-21 DIAGNOSIS — D2261 Melanocytic nevi of right upper limb, including shoulder: Secondary | ICD-10-CM | POA: Diagnosis not present

## 2023-09-21 DIAGNOSIS — L570 Actinic keratosis: Secondary | ICD-10-CM | POA: Diagnosis not present

## 2023-09-21 DIAGNOSIS — D485 Neoplasm of uncertain behavior of skin: Secondary | ICD-10-CM | POA: Diagnosis not present

## 2023-09-21 DIAGNOSIS — D225 Melanocytic nevi of trunk: Secondary | ICD-10-CM | POA: Diagnosis not present

## 2023-09-21 DIAGNOSIS — L905 Scar conditions and fibrosis of skin: Secondary | ICD-10-CM | POA: Diagnosis not present

## 2023-09-28 ENCOUNTER — Other Ambulatory Visit: Payer: Self-pay | Admitting: Family Medicine

## 2023-09-28 DIAGNOSIS — E1169 Type 2 diabetes mellitus with other specified complication: Secondary | ICD-10-CM

## 2023-09-29 NOTE — Telephone Encounter (Signed)
 Requested Prescriptions  Pending Prescriptions Disp Refills   metFORMIN  (GLUCOPHAGE -XR) 500 MG 24 hr tablet [Pharmacy Med Name: METFORMIN  HCL ER 500 MG TABLET] 90 tablet 0    Sig: Take 1 tablet (500 mg total) by mouth daily with supper.     Endocrinology:  Diabetes - Biguanides Failed - 09/29/2023 11:22 AM      Failed - HBA1C is between 0 and 7.9 and within 180 days    Hemoglobin A1C  Date Value Ref Range Status  07/10/2023 8.4 (A) 4.0 - 5.6 % Final   Hgb A1c MFr Bld  Date Value Ref Range Status  04/03/2023 8.7 (H) <5.7 % of total Hgb Final    Comment:    For someone without known diabetes, a hemoglobin A1c value of 6.5% or greater indicates that they may have  diabetes and this should be confirmed with a follow-up  test. . For someone with known diabetes, a value <7% indicates  that their diabetes is well controlled and a value  greater than or equal to 7% indicates suboptimal  control. A1c targets should be individualized based on  duration of diabetes, age, comorbid conditions, and  other considerations. . Currently, no consensus exists regarding use of hemoglobin A1c for diagnosis of diabetes for children. .          Failed - B12 Level in normal range and within 720 days    No results found for: "VITAMINB12"       Passed - Cr in normal range and within 360 days    Creat  Date Value Ref Range Status  04/03/2023 1.19 0.70 - 1.22 mg/dL Final   Creatinine, Urine  Date Value Ref Range Status  11/08/2022 122 20 - 320 mg/dL Final         Passed - eGFR in normal range and within 360 days    GFR, Est African American  Date Value Ref Range Status  10/15/2020 73 > OR = 60 mL/min/1.23m2 Final   GFR, Est Non African American  Date Value Ref Range Status  10/15/2020 63 > OR = 60 mL/min/1.70m2 Final   eGFR  Date Value Ref Range Status  04/03/2023 61 > OR = 60 mL/min/1.47m2 Final         Passed - Valid encounter within last 6 months    Recent Outpatient Visits            2 months ago Type 2 diabetes mellitus with other specified complication, without long-term current use of insulin [facility])   [facility] Stepney, Kayleen Party, DO       Future Appointments             In 9 months Stoioff, Kizzie Perks, MD [facility] [place]            Passed - CBC within normal limits and completed in the last 12 months    WBC  Date Value Ref Range Status  10/27/2022 7.5 3.8 - 10.8 Thousand/uL Final   RBC  Date Value Ref Range Status  10/27/2022 4.29 4.20 - 5.80 Million/uL Final   Hemoglobin  Date Value Ref Range Status  10/27/2022 13.3 13.2 - 17.1 g/dL Final   HGB  Date Value Ref Range Status  06/02/2012 15.1 13.0 - 18.0 g/dL Final   HCT  Date Value Ref Range Status  10/27/2022 40.2 38.5 - 50.0 % Final  06/02/2012 44.7 40.0 - 52.0 % Final   MCHC  Date Value Ref Range Status  10/27/2022 33.1 32.0 - 36.0 g/dL Final   [facility]  Date Value Ref Range Status  10/27/2022 31.0 27.0 - 33.0 pg Final   MCV  Date Value Ref Range Status  10/27/2022 93.7 80.0 - 100.0 fL Final  06/02/2012 93 80 - 100 fL Final   No results found for: "PLTCOUNTKUC", "LABPLAT", "POCPLA" RDW  Date Value Ref Range Status  10/27/2022 13.8 11.0 - 15.0 % Final  06/02/2012 13.4 11.5 - 14.5 % Final

## 2023-11-03 ENCOUNTER — Ambulatory Visit (INDEPENDENT_AMBULATORY_CARE_PROVIDER_SITE_OTHER): Payer: Medicare HMO

## 2023-11-03 DIAGNOSIS — Z0000 Encounter for general adult medical examination without abnormal findings: Secondary | ICD-10-CM | POA: Diagnosis not present

## 2023-11-03 NOTE — Patient Instructions (Signed)
 [name] , Thank you for taking time out of your busy schedule to complete your Annual Wellness Visit with me. I enjoyed our conversation and look forward to speaking with you again next year. I, as well as your care team,  appreciate your ongoing commitment to your health goals. Please review the following plan we discussed and let me know if I can assist you in the future.    Follow up Visits: Next Medicare AWV with our clinical staff: 11/15/24 @ 10:50 AM BY PHONE   Have you seen your provider in the last 6 months (3 months if uncontrolled diabetes)? Yes  Clinician Recommendations:  Aim for 30 minutes of exercise or brisk walking, 6-8 glasses of water, and 5 servings of fruits and vegetables each day. TAKE CARE!      This is a list of the screening recommended for you and due dates:  Health Maintenance  Topic Date Due   COVID-19 Vaccine (7 - Pfizer risk 2024-25 season) 09/05/2023   Yearly kidney health urinalysis for diabetes  11/08/2023   Complete foot exam   11/08/2023   Flu Shot  12/08/2023   Hemoglobin A1C  01/10/2024   Eye exam for diabetics  03/19/2024   Yearly kidney function blood test for diabetes  04/02/2024   Medicare Annual Wellness Visit  11/02/2024   DTaP/Tdap/Td vaccine (3 - Td or Tdap) 11/30/2026   Pneumococcal Vaccine for age over 57  Completed   Zoster (Shingles) Vaccine  Completed   Hepatitis B Vaccine  Aged Out   HPV Vaccine  Aged Out   Meningitis B Vaccine  Aged Out    Advanced directives: (ACP Link)Information on Advanced Care Planning can be found at [place]  Print production planner Health Care Directives Advance Health Care Directives. [email or web address]  Advance Care Planning is important because it:  [x]  Makes sure you receive the medical care that is consistent with your values, goals, and preferences  [x]  It provides guidance to your family and loved ones and reduces their decisional burden about whether or not they are making the right decisions  based on your wishes.  Follow the link provided in your after visit summary or read over the paperwork we have mailed to you to help you started getting your Advance Directives in place. If you need assistance in completing these, please reach out to us  so that we can help you!

## 2023-11-03 NOTE — Progress Notes (Signed)
 Subjective:   Kyle Vargas. is a 83 y.o. who presents for a Medicare Wellness preventive visit.  As a reminder, Annual Wellness Visits don't include a physical exam, and some assessments may be limited, especially if this visit is performed virtually. We may recommend an in-person follow-up visit with your provider if needed.  Visit Complete: Virtual I connected with  [name] on 11/03/23 by a audio enabled telemedicine application and verified that I am speaking with the correct person using two identifiers.  Patient Location: Home  Provider Location: Home Office  I discussed the limitations of evaluation and management by telemedicine. The patient expressed understanding and agreed to proceed.  Vital Signs: Because this visit was a virtual/telehealth visit, some criteria may be missing or patient reported. Any vitals not documented were not able to be obtained and vitals that have been documented are patient reported.  VideoDeclined- This patient declined Librarian, academic. Therefore the visit was completed with audio only.  Persons Participating in Visit: Patient.  AWV Questionnaire: No: Patient Medicare AWV questionnaire was not completed prior to this visit.  Cardiac Risk Factors include: advanced age (>66men, >35 women);diabetes mellitus;hypertension;male gender;dyslipidemia     Objective:    There were no vitals filed for this visit. There is no height or weight on file to calculate BMI.     11/03/2023   10:58 AM 09/07/2023    8:33 AM 08/19/2022    9:34 AM 08/04/2020   11:03 AM 08/01/2019   10:12 AM 12/25/2018   11:34 AM 12/19/2017    9:57 AM  Advanced Directives  Does Patient Have a Medical Advance Directive? Yes Yes Yes Yes Yes Yes Yes   Type of Estate agent of [place];Living will Healthcare Power of [place];Living will Healthcare Power of [place];Living will Healthcare Power of [place];Living will  Living will Living will;Healthcare Power of [employer] Power of [place];Living will  Does patient want to make changes to medical advance directive? No - Patient declined  No - Patient declined  Yes (MAU/Ambulatory/Procedural Areas - Information given)    Copy of Healthcare Power of Attorney in Chart? Yes - validated most recent copy scanned in chart (See row information)  Yes - validated most recent copy scanned in chart (See row information) Yes - validated most recent copy scanned in chart (See row information)   No - copy requested      Data saved with a previous flowsheet row definition    Current Medications (verified) Outpatient Encounter Medications as of 11/03/2023  Medication Sig   aspirin 81 MG tablet Take 81 mg by mouth daily. Takes 5 days a week   azelastine (ASTELIN) 0.1 % nasal spray Place 1 spray into both nostrils 2 (two) times daily.    Blood Pressure Monitoring (BLOOD PRESSURE CUFF) MISC 1 Device by Does not apply route daily. Normal upper arm BP cuff   diltiazem  (CARDIZEM  CD) 240 MG 24 hr capsule Take 1 capsule (240 mg total) by mouth every morning.   glucose blood test strip Use as instructed   hydrochlorothiazide  (HYDRODIURIL ) 12.5 MG tablet Take 1 tablet (12.5 mg total) by mouth daily.   ipratropium (ATROVENT ) 0.06 % nasal spray Place 2 sprays into both nostrils 4 (four) times daily. For up to 5-7 days then stop.   losartan  (COZAAR ) 100 MG tablet Take 1 tablet (100 mg total) by mouth daily.   metFORMIN  (GLUCOPHAGE -XR) 500 MG 24 hr tablet Take 1 tablet (500 mg total)  by mouth daily with supper.   metoprolol  tartrate (LOPRESSOR ) 25 MG tablet Take 1.5 tablets (37.5 mg total) by mouth 2 (two) times daily.   montelukast  (SINGULAIR ) 10 MG tablet Take 1 tablet (10 mg total) by mouth at bedtime.   NON FORMULARY Vitreous Health Takes 1 capsule daily.   Omega-3 Fatty Acids (OMEGA-3 FISH OIL ) 1200 MG CAPS Take 1 capsule (1,200 mg total) by mouth 3 (three) times daily.    rosuvastatin  (CRESTOR ) 10 MG tablet Take 1 tablet (10 mg total) by mouth daily.   Sodium Chloride -Sodium Bicarb 1.57 g PACK Place into the nose as needed.   tamsulosin  (FLOMAX ) 0.4 MG CAPS capsule Take 1 capsule (0.4 mg total) by mouth daily.   Garlic 10 MG CAPS Take by mouth. Unsure of dose (Patient not taking: Reported on 11/03/2023)   No facility-administered encounter medications on file as of 11/03/2023.    Allergies (verified) Dust mite extract and Other   History: Past Medical History:  Diagnosis Date   Allergic rhinitis, cause unspecified    Diarrhea    Dysrhythmia    Esophageal reflux    H/O diverticulitis of colon    History of kidney stones    PONV (postoperative nausea and vomiting)    PVC's (premature ventricular contractions)    Right ventricular outflow tract   Past Surgical History:  Procedure Laterality Date   BASAL CELL CARCINOMA EXCISION     CATARACT EXTRACTION, BILATERAL  2020   COLON SURGERY     COLONOSCOPY     COLONOSCOPY N/A 09/07/2023   Procedure: COLONOSCOPY;  Surgeon: Onita Elspeth Sharper, DO;  Location: [facility] ENDOSCOPY;  Service: Gastroenterology;  Laterality: N/A;   COLONOSCOPY WITH PROPOFOL  N/A 02/08/2017   Procedure: COLONOSCOPY WITH PROPOFOL ;  Surgeon: Viktoria Lamar DASEN, MD;  Location: [facility] ENDOSCOPY;  Service: Endoscopy;  Laterality: N/A;   EYE SURGERY     FLEXIBLE SIGMOIDOSCOPY     POLYPECTOMY  09/07/2023   Procedure: POLYPECTOMY, INTESTINE;  Surgeon: Onita Elspeth Sharper, DO;  Location: [facility] ENDOSCOPY;  Service: Gastroenterology;;   REPAIR IMPERFORATE ANUS / ANORECTOPLASTY     TRANSURETHRAL RESECTION OF PROSTATE     Family History  Problem Relation Age of Onset   Hypertension Father    Social History   Socioeconomic History   Marital status: Married    Spouse name: Education officer, community   Number of children: 3   Years of education: Not on file   Highest education level: Some college, no degree  Occupational History   Occupation: retired   Tobacco Use   Smoking status: Former    Types: Cigarettes    Passive exposure: Never   Smokeless tobacco: Never  Vaping Use   Vaping status: Never Used  Substance and Sexual Activity   Alcohol use: Yes    Comment: occasionally  2 glasses of wine a month    Drug use: No   Sexual activity: Not on file  Other Topics Concern   Not on file  Social History Narrative   Not on file   Social Drivers of Health   Financial Resource Strain: Low Risk  (11/03/2023)   Overall Financial Resource Strain (CARDIA)    Difficulty of Paying Living Expenses: Not hard at all  Food Insecurity: No Food Insecurity (11/03/2023)   Hunger Vital Sign    Worried About Running Out of Food in the Last Year: Never true    Ran Out of Food in the Last Year: Never true  Transportation Needs: No Transportation Needs (11/03/2023)  PRAPARE - Administrator, Civil Service (Medical): No    Lack of Transportation (Non-Medical): No  Physical Activity: Sufficiently Active (11/03/2023)   Exercise Vital Sign    Days of Exercise per Week: 5 days    Minutes of Exercise per Session: 30 min  Stress: No Stress Concern Present (11/03/2023)   [employer] of Occupational Health - Occupational Stress Questionnaire    Feeling of Stress: Not at all  Social Connections: Moderately Integrated (11/03/2023)   Social Connection and Isolation Panel    Frequency of Communication with Friends and Family: More than three times a week    Frequency of Social Gatherings with Friends and Family: Once a week    Attends Religious Services: More than 4 times per year    Active Member of [employer] or Organizations: No    Attends Engineer, structural: Never    Marital Status: Married    Tobacco Counseling Counseling given: Not Answered    Clinical Intake:  Pre-visit preparation completed: Yes  Pain : No/denies pain     BMI - recorded: 27.8 Nutritional Status: BMI 25 -29 Overweight Nutritional Risks:  None Diabetes: Yes CBG done?: No Did pt. bring in CBG monitor from home?: No  Lab Results  Component Value Date   HGBA1C 8.4 (A) 07/10/2023   HGBA1C 8.7 (H) 04/03/2023   HGBA1C 8.9 (H) 10/27/2022     How often do you need to have someone help you when you read instructions, pamphlets, or other written materials from your doctor or pharmacy?: 1 - Never  Interpreter Needed?: No  Information entered by :: JHONNIE DAS, LPN   Activities of Daily Living    11/03/2023   10:59 AM 11/08/2022    8:32 AM  In your present state of health, do you have any difficulty performing the following activities:  Hearing? 0 0  Vision? 0 0  Difficulty concentrating or making decisions? 0 0  Walking or climbing stairs? 0 0  Dressing or bathing? 0 0  Doing errands, shopping? 0 0  Preparing Food and eating ? N   Using the Toilet? N   In the past six months, have you accidently leaked urine? N   Do you have problems with loss of bowel control? N   Managing your Medications? N   Managing your Finances? N   Housekeeping or managing your Housekeeping? N     Patient Care Team: Edman Marsa PARAS, DO as PCP - General (Family Medicine) Cathlyn Seal, MD (Dermatology) Kassie Ozell SAUNDERS, MD as Consulting Physician (Urology) Darron Deatrice LABOR, MD as Consulting Physician (Cardiology) Mevelyn JONETTA Bathe, OD (Optometry)  I have updated your Care Teams any recent Medical Services you may have received from other providers in the past year.     Assessment:   This is a routine wellness examination for Rizwan.  Hearing/Vision screen Hearing Screening - Comments:: NO AIDS Vision Screening - Comments:: NO GLASSES- DR.[email or web address]   Goals Addressed             This Visit's Progress    DIET - INCREASE WATER INTAKE         Depression Screen     11/03/2023   10:57 AM 07/10/2023    8:08 AM 04/10/2023    8:21 AM 11/08/2022    8:31 AM 08/19/2022    9:33 AM 04/27/2022   11:55 AM 10/26/2021    1:37 PM   PHQ 2/9 Scores  PHQ - 2 Score 0 0 0 0  0 0 0  PHQ- 9 Score 0 3 3 2  0 0     Fall Risk     11/03/2023   10:59 AM 07/10/2023    8:08 AM 04/10/2023    8:22 AM 11/08/2022    8:32 AM 08/19/2022    9:35 AM  Fall Risk   Falls in the past year? 0 0 1 1 0  Number falls in past yr: 0  0 0 0  Injury with Fall? 0  0 1 0  Risk for fall due to : No Fall Risks  No Fall Risks No Fall Risks No Fall Risks  Follow up Falls evaluation completed  Falls evaluation completed Falls evaluation completed Falls prevention discussed;Falls evaluation completed    MEDICARE RISK AT HOME:  Medicare Risk at Home Any stairs in or around the home?: Yes If so, are there any without handrails?: No Home free of loose throw rugs in walkways, pet beds, electrical cords, etc?: Yes Adequate lighting in your home to reduce risk of falls?: Yes Life alert?: No Use of a cane, walker or w/c?: No Grab bars in the bathroom?: Yes Shower chair or bench in shower?: Yes Elevated toilet seat or a handicapped toilet?: No  TIMED UP AND GO:  Was the test performed?  No  Cognitive Function: 6CIT completed    01/23/2015    9:55 AM  MMSE - Mini Mental State Exam  Orientation to time 5   Orientation to Place 5   Registration 3   Attention/ Calculation 5   Recall 2   Language- name 2 objects 2   Language- repeat 1  Language- follow 3 step command 3   Language- read & follow direction 1   Write a sentence 1   Copy design 1   Total score 29      Data saved with a previous flowsheet row definition        11/03/2023   11:00 AM 08/19/2022    9:38 AM 08/10/2021    1:55 PM 08/04/2020   11:06 AM 12/25/2018   11:34 AM  6CIT Screen  What Year? 0 points 0 points 0 points 0 points 0 points  What month? 0 points 0 points 0 points 0 points 0 points  What time? 0 points 0 points 0 points 0 points 0 points  Count back from 20 0 points 0 points 0 points 0 points 0 points  Months in reverse 0 points 0 points 0 points 0 points 0 points   Repeat phrase 0 points 0 points 0 points 0 points 0 points  Total Score 0 points 0 points 0 points 0 points 0 points    Immunizations Immunization History  Administered Date(s) Administered   Influenza, High Dose Seasonal PF 02/11/2014, 01/23/2015, 02/04/2016, 02/15/2017, 02/02/2021   Influenza,inj,quad, With Preservative 01/08/2019   Influenza-Unspecified 03/14/2014, 02/07/2018, 01/31/2020, 04/16/2021, 02/22/2022, 03/07/2023   Moderna Covid-19 Fall Seasonal Vaccine 57yrs & older 04/05/2022, 03/07/2023   PFIZER(Purple Top)SARS-COV-2 Vaccination 05/16/2019, 06/06/2019, 02/10/2020, 02/16/2021   PNEUMOCOCCAL CONJUGATE-20 07/10/2023   Pneumococcal Conjugate-13 01/21/2014   Pneumococcal Polysaccharide-23 03/09/2008   Tdap 01/21/2014, 11/29/2016   Zoster Recombinant(Shingrix) 12/25/2018, 04/02/2019   Zoster, Live 01/23/2015    Screening Tests Health Maintenance  Topic Date Due   COVID-19 Vaccine (7 - Pfizer risk 2024-25 season) 09/05/2023   Diabetic kidney evaluation - Urine ACR  11/08/2023   FOOT EXAM  11/08/2023   INFLUENZA VACCINE  12/08/2023   HEMOGLOBIN A1C  01/10/2024   OPHTHALMOLOGY EXAM  03/19/2024  Diabetic kidney evaluation - eGFR measurement  04/02/2024   Medicare Annual Wellness (AWV)  11/02/2024   DTaP/Tdap/Td (3 - Td or Tdap) 11/30/2026   Pneumococcal Vaccine: 50+ Years  Completed   Zoster Vaccines- Shingrix  Completed   Hepatitis B Vaccines  Aged Out   HPV VACCINES  Aged Out   Meningococcal B Vaccine  Aged Out    Health Maintenance  Health Maintenance Due  Topic Date Due   COVID-19 Vaccine (7 - Pfizer risk 2024-25 season) 09/05/2023   Diabetic kidney evaluation - Urine ACR  11/08/2023   Health Maintenance Items Addressed: AGED OUT OF COLONOSCOPY; UP TO DATE ON ALL SHOTS  Additional Screening:  Vision Screening: Recommended annual ophthalmology exams for early detection of glaucoma and other disorders of the eye. Would you like a referral to an eye  doctor? No    Dental Screening: Recommended annual dental exams for proper oral hygiene  Community Resource Referral / Chronic Care Management: CRR required this visit?  No   CCM required this visit?  No   Plan:    I have personally reviewed and noted the following in the patient's chart:   Medical and social history Use of alcohol, tobacco or illicit drugs  Current medications and supplements including opioid prescriptions. Patient is not currently taking opioid prescriptions. Functional ability and status Nutritional status Physical activity Advanced directives List of other physicians Hospitalizations, surgeries, and ER visits in previous 12 months Vitals Screenings to include cognitive, depression, and falls Referrals and appointments  In addition, I have reviewed and discussed with patient certain preventive protocols, quality metrics, and best practice recommendations. A written personalized care plan for preventive services as well as general preventive health recommendations were provided to patient.   Jhonnie GORMAN Das, LPN   [id number]/72/[id number]   After Visit Summary: (MyChart) Due to this being a telephonic visit, the after visit summary with patients personalized plan was offered to patient via MyChart   Notes: Nothing significant to report at this time.

## 2023-11-06 DIAGNOSIS — D0439 Carcinoma in situ of skin of other parts of face: Secondary | ICD-10-CM | POA: Diagnosis not present

## 2023-11-14 DIAGNOSIS — D044 Carcinoma in situ of skin of scalp and neck: Secondary | ICD-10-CM | POA: Diagnosis not present

## 2023-12-11 ENCOUNTER — Other Ambulatory Visit

## 2023-12-11 DIAGNOSIS — E1169 Type 2 diabetes mellitus with other specified complication: Secondary | ICD-10-CM | POA: Diagnosis not present

## 2023-12-11 DIAGNOSIS — I10 Essential (primary) hypertension: Secondary | ICD-10-CM | POA: Diagnosis not present

## 2023-12-11 DIAGNOSIS — N138 Other obstructive and reflux uropathy: Secondary | ICD-10-CM

## 2023-12-11 DIAGNOSIS — Z0000 Encounter for general adult medical examination without abnormal findings: Secondary | ICD-10-CM

## 2023-12-11 DIAGNOSIS — E785 Hyperlipidemia, unspecified: Secondary | ICD-10-CM | POA: Diagnosis not present

## 2023-12-11 DIAGNOSIS — N401 Enlarged prostate with lower urinary tract symptoms: Secondary | ICD-10-CM | POA: Diagnosis not present

## 2023-12-12 LAB — COMPLETE METABOLIC PANEL WITHOUT GFR
AG Ratio: 1.5 (calc) (ref 1.0–2.5)
ALT: 17 U/L (ref 9–46)
AST: 11 U/L (ref 10–35)
Albumin: 3.9 g/dL (ref 3.6–5.1)
Alkaline phosphatase (APISO): 89 U/L (ref 35–144)
BUN: 15 mg/dL (ref 7–25)
CO2: 28 mmol/L (ref 20–32)
Calcium: 9.2 mg/dL (ref 8.6–10.3)
Chloride: 105 mmol/L (ref 98–110)
Creat: 1.1 mg/dL (ref 0.70–1.22)
Globulin: 2.6 g/dL (ref 1.9–3.7)
Glucose, Bld: 168 mg/dL — ABNORMAL HIGH (ref 65–99)
Potassium: 4.3 mmol/L (ref 3.5–5.3)
Sodium: 141 mmol/L (ref 135–146)
Total Bilirubin: 0.6 mg/dL (ref 0.2–1.2)
Total Protein: 6.5 g/dL (ref 6.1–8.1)

## 2023-12-12 LAB — CBC WITH DIFFERENTIAL/PLATELET
Absolute Lymphocytes: 1454 {cells}/uL (ref 850–3900)
Absolute Monocytes: 719 {cells}/uL (ref 200–950)
Basophils Absolute: 40 {cells}/uL (ref 0–200)
Basophils Relative: 0.5 %
Eosinophils Absolute: 205 {cells}/uL (ref 15–500)
Eosinophils Relative: 2.6 %
HCT: 37.3 % — ABNORMAL LOW (ref 38.5–50.0)
Hemoglobin: 12.4 g/dL — ABNORMAL LOW (ref 13.2–17.1)
MCH: 31.1 pg (ref 27.0–33.0)
MCHC: 33.2 g/dL (ref 32.0–36.0)
MCV: 93.5 fL (ref 80.0–100.0)
MPV: 9.5 fL (ref 7.5–12.5)
Monocytes Relative: 9.1 %
Neutro Abs: 5483 {cells}/uL (ref 1500–7800)
Neutrophils Relative %: 69.4 %
Platelets: 193 Thousand/uL (ref 140–400)
RBC: 3.99 Million/uL — ABNORMAL LOW (ref 4.20–5.80)
RDW: 13.6 % (ref 11.0–15.0)
Total Lymphocyte: 18.4 %
WBC: 7.9 Thousand/uL (ref 3.8–10.8)

## 2023-12-12 LAB — MICROALBUMIN / CREATININE URINE RATIO
Creatinine, Urine: 110 mg/dL (ref 20–320)
Microalb Creat Ratio: 84 mg/g{creat} — ABNORMAL HIGH (ref <30)
Microalb, Ur: 9.2 mg/dL

## 2023-12-12 LAB — HEMOGLOBIN A1C
Hgb A1c MFr Bld: 7.9 % — ABNORMAL HIGH (ref <5.7)
Mean Plasma Glucose: 180 mg/dL
eAG (mmol/L): 10 mmol/L

## 2023-12-12 LAB — LIPID PANEL
Cholesterol: 90 mg/dL (ref <200)
HDL: 45 mg/dL (ref >=40)
LDL Cholesterol (Calc): 19 mg/dL
Non-HDL Cholesterol (Calc): 45 mg/dL (ref <130)
Total CHOL/HDL Ratio: 2 (calc) (ref <5.0)
Triglycerides: 190 mg/dL — ABNORMAL HIGH (ref <150)

## 2023-12-12 LAB — PSA: PSA: 1.35 ng/mL (ref <=4.00)

## 2023-12-13 ENCOUNTER — Encounter: Payer: Self-pay | Admitting: Urology

## 2023-12-20 DIAGNOSIS — H43812 Vitreous degeneration, left eye: Secondary | ICD-10-CM | POA: Diagnosis not present

## 2023-12-20 DIAGNOSIS — E119 Type 2 diabetes mellitus without complications: Secondary | ICD-10-CM | POA: Diagnosis not present

## 2023-12-20 DIAGNOSIS — H26493 Other secondary cataract, bilateral: Secondary | ICD-10-CM | POA: Diagnosis not present

## 2023-12-20 LAB — HM DIABETES EYE EXAM

## 2023-12-23 ENCOUNTER — Other Ambulatory Visit: Payer: Self-pay | Admitting: Family Medicine

## 2023-12-23 DIAGNOSIS — E1169 Type 2 diabetes mellitus with other specified complication: Secondary | ICD-10-CM

## 2023-12-25 ENCOUNTER — Ambulatory Visit (INDEPENDENT_AMBULATORY_CARE_PROVIDER_SITE_OTHER): Admitting: Family Medicine

## 2023-12-25 ENCOUNTER — Encounter: Payer: Self-pay | Admitting: Family Medicine

## 2023-12-25 VITALS — BP 138/64 | HR 64 | Ht 68.0 in | Wt 174.4 lb

## 2023-12-25 DIAGNOSIS — E1169 Type 2 diabetes mellitus with other specified complication: Secondary | ICD-10-CM | POA: Diagnosis not present

## 2023-12-25 DIAGNOSIS — N138 Other obstructive and reflux uropathy: Secondary | ICD-10-CM

## 2023-12-25 DIAGNOSIS — N401 Enlarged prostate with lower urinary tract symptoms: Secondary | ICD-10-CM | POA: Diagnosis not present

## 2023-12-25 DIAGNOSIS — I10 Essential (primary) hypertension: Secondary | ICD-10-CM

## 2023-12-25 DIAGNOSIS — Z7984 Long term (current) use of oral hypoglycemic drugs: Secondary | ICD-10-CM | POA: Diagnosis not present

## 2023-12-25 DIAGNOSIS — L600 Ingrowing nail: Secondary | ICD-10-CM

## 2023-12-25 DIAGNOSIS — M79674 Pain in right toe(s): Secondary | ICD-10-CM | POA: Diagnosis not present

## 2023-12-25 DIAGNOSIS — Z0000 Encounter for general adult medical examination without abnormal findings: Secondary | ICD-10-CM | POA: Diagnosis not present

## 2023-12-25 MED ORDER — METFORMIN HCL ER 500 MG PO TB24: 500.0000 mg | ORAL_TABLET | Freq: Every day | ORAL | 1 refills | Status: AC

## 2023-12-25 MED ORDER — HYDROCHLOROTHIAZIDE 12.5 MG PO TABS: 12.5000 mg | ORAL_TABLET | Freq: Every day | ORAL | 3 refills | Status: AC

## 2023-12-25 NOTE — Patient Instructions (Addendum)
 Thank you for coming to the office today.  Refilled Metformin   Recent Labs    04/03/23 0827 07/10/23 0808 12/11/23 0855  HGBA1C 8.7* 8.4* 7.9*   Improving sugars and Triglycerides  Energy is probably not directly related to the Metformin  but likely as sugar improves it will improve as well when the body re calibrates  Chat wit Dr Darron about the Coronary Heart Scan in October  Likely ingrown toenail  [facility] [address] [place], [place] [address] Hours - M-F 8-5 Phone: [phone] phone   Please schedule a Follow-up Appointment to: Return in about 6 months (around 06/26/2024) for 6 month DM A1c.  If you have any other questions or concerns, please feel free to call the office or send a message through MyChart. You may also schedule an earlier appointment if necessary.  Additionally, you may be receiving a survey about your experience at our office within a few days to 1 week by e-mail or mail. We value your feedback.  Marsa Officer, DO [facility], [place]

## 2023-12-25 NOTE — Progress Notes (Unsigned)
 Subjective:    Patient ID: Kyle Vargas., male    DOB: 1940/12/25, 83 y.o.   MRN: [id number]  Kyle Vargas. is a 83 y.o. male presenting on 12/25/2023 for Medical Management of Chronic Issues   HPI  Discussed the use of AI scribe software for clinical note transcription with the patient, who gave verbal consent to proceed.  History of Present Illness   Type 2 Diabetes A1c down to 8.4 Still elevated overall Interested in medication adjustment CBGs: Average monitored Meds: Metformin  XR 500mg  daily tolerating well Currently on ARB Lifestyle: - Diet (goal to improve, reduce sodas and chips) - Exercise (walks 30 min daily 5 x weekly, works in garden, yard, active at home) - Diabetic Eye Exam from Dr Mevelyn Denies hypoglycemia   CHRONIC HTN / Intermittent PVC - controlled. Elevated BP improved at home based on readings Followed by [facility] Dr Darron. Prior work up they checked Renal Artery US  due to spikes or elevation in BP, results show normal without stenosis. Home BP improved he admits occasional lower readings at time or dizzy. Current Meds - Losartan  100mg , HCTZ 25mg , Metoprolol  37.5mg  twice daily (25mg  tabs, 1.5 per dose), Diltiazem -24 hour 240mg  Reports good compliance, took meds today. Tolerating well, w/o complaints. - Taking ASA 81mg  x 5 days weekly, skips weekends. No known CAD or CVA   HYPERLIPIDEMIA: - Currently taking Rosuvastatin  10mg  nightly, Fish Oil  Omega 3, 1200 - taking x 3 a day   Insomnia   Health Maintenance:   PSA 1.35 (12/2023), last lab 1.32-1.33      12/25/2023    8:29 AM 11/03/2023   10:57 AM 07/10/2023    8:08 AM  Depression screen PHQ 2/9  Decreased Interest 1 0 0  Down, Depressed, Hopeless 0 0 0  PHQ - 2 Score 1 0 0  Altered sleeping 2 0 2  Tired, decreased energy 1 0 1  Change in appetite  0 0  Feeling bad or failure about yourself  0 0 0  Trouble concentrating 0 0 0  Moving slowly or fidgety/restless 0 0 0  Suicidal  thoughts 0 0 0  PHQ-9 Score 4 0 3  Difficult doing work/chores Not difficult at all Not difficult at all Not difficult at all       12/25/2023    8:31 AM 07/10/2023    8:08 AM 04/10/2023    8:22 AM 11/08/2022    8:32 AM  GAD 7 : Generalized Anxiety Score  Nervous, Anxious, on Edge 1 0 0 0  Control/stop worrying 0 0 0 0  Worry too much - different things 0 0 0 0  Trouble relaxing 0 0 0 0  Restless 0 0 0 0  Easily annoyed or irritable 1 0 0 0  Afraid - awful might happen 0 0 0 0  Total GAD 7 Score 2 0 0 0  Anxiety Difficulty Not difficult at all  Not difficult at all Not difficult at all     Past Medical History:  Diagnosis Date   Allergic rhinitis, cause unspecified    Diarrhea    Dysrhythmia    Esophageal reflux    H/O diverticulitis of colon    History of kidney stones    PONV (postoperative nausea and vomiting)    PVC's (premature ventricular contractions)    Right ventricular outflow tract   Past Surgical History:  Procedure Laterality Date   BASAL CELL CARCINOMA EXCISION     CATARACT EXTRACTION, BILATERAL  2020   COLON SURGERY     COLONOSCOPY     COLONOSCOPY N/A 09/07/2023   Procedure: COLONOSCOPY;  Surgeon: Onita Elspeth Sharper, DO;  Location: [facility] ENDOSCOPY;  Service: Gastroenterology;  Laterality: N/A;   COLONOSCOPY WITH PROPOFOL  N/A 02/08/2017   Procedure: COLONOSCOPY WITH PROPOFOL ;  Surgeon: Viktoria Lamar DASEN, MD;  Location: [facility] ENDOSCOPY;  Service: Endoscopy;  Laterality: N/A;   EYE SURGERY     FLEXIBLE SIGMOIDOSCOPY     POLYPECTOMY  09/07/2023   Procedure: POLYPECTOMY, INTESTINE;  Surgeon: Onita Elspeth Sharper, DO;  Location: [facility] ENDOSCOPY;  Service: Gastroenterology;;   REPAIR IMPERFORATE ANUS / ANORECTOPLASTY     TRANSURETHRAL RESECTION OF PROSTATE     Social History   Socioeconomic History   Marital status: Married    Spouse name: Millcent Feng   Number of children: 3   Years of education: Not on file   Highest education level: Some college, no  degree  Occupational History   Occupation: retired  Tobacco Use   Smoking status: Former    Types: Cigarettes    Passive exposure: Never   Smokeless tobacco: Never  Vaping Use   Vaping status: Never Used  Substance and Sexual Activity   Alcohol use: Yes    Comment: occasionally  2 glasses of wine a month    Drug use: No   Sexual activity: Not on file  Other Topics Concern   Not on file  Social History Narrative   Not on file   Social Drivers of Health   Financial Resource Strain: Low Risk  (11/03/2023)   Overall Financial Resource Strain (CARDIA)    Difficulty of Paying Living Expenses: Not hard at all  Food Insecurity: No Food Insecurity (11/03/2023)   Hunger Vital Sign    Worried About Running Out of Food in the Last Year: Never true    Ran Out of Food in the Last Year: Never true  Transportation Needs: No Transportation Needs (11/03/2023)   PRAPARE - Administrator, Civil Service (Medical): No    Lack of Transportation (Non-Medical): No  Physical Activity: Sufficiently Active (11/03/2023)   Exercise Vital Sign    Days of Exercise per Week: 5 days    Minutes of Exercise per Session: 30 min  Stress: No Stress Concern Present (11/03/2023)   [employer] of Occupational Health - Occupational Stress Questionnaire    Feeling of Stress: Not at all  Social Connections: Moderately Integrated (11/03/2023)   Social Connection and Isolation Panel    Frequency of Communication with Friends and Family: More than three times a week    Frequency of Social Gatherings with Friends and Family: Once a week    Attends Religious Services: More than 4 times per year    Active Member of [employer] or Organizations: No    Attends Banker Meetings: Never    Marital Status: Married  Catering manager Violence: Not At Risk (11/03/2023)   Humiliation, Afraid, Rape, and Kick questionnaire    Fear of Current or Ex-Partner: No    Emotionally Abused: No    Physically Abused: No     Sexually Abused: No   Family History  Problem Relation Age of Onset   Hypertension Father    Current Outpatient Medications on File Prior to Visit  Medication Sig   aspirin 81 MG tablet Take 81 mg by mouth daily. Takes 5 days a week   azelastine (ASTELIN) 0.1 % nasal spray Place 1 spray into both nostrils 2 (two)  times daily.    Blood Pressure Monitoring (BLOOD PRESSURE CUFF) MISC 1 Device by Does not apply route daily. Normal upper arm BP cuff   Cyanocobalamin (VITAMIN B 12) 500 MCG TABS Take by mouth.   diltiazem  (CARDIZEM  CD) 240 MG 24 hr capsule Take 1 capsule (240 mg total) by mouth every morning.   glucose blood test strip Use as instructed   ipratropium (ATROVENT ) 0.06 % nasal spray Place 2 sprays into both nostrils 4 (four) times daily. For up to 5-7 days then stop.   losartan  (COZAAR ) 100 MG tablet Take 1 tablet (100 mg total) by mouth daily.   metoprolol  tartrate (LOPRESSOR ) 25 MG tablet Take 1.5 tablets (37.5 mg total) by mouth 2 (two) times daily.   montelukast  (SINGULAIR ) 10 MG tablet Take 1 tablet (10 mg total) by mouth at bedtime.   NON FORMULARY Vitreous Health Takes 1 capsule daily.   Omega-3 Fatty Acids (OMEGA-3 FISH OIL ) 1200 MG CAPS Take 1 capsule (1,200 mg total) by mouth 3 (three) times daily.   Sodium Chloride -Sodium Bicarb 1.57 g PACK Place into the nose as needed.   tamsulosin  (FLOMAX ) 0.4 MG CAPS capsule Take 1 capsule (0.4 mg total) by mouth daily.   Garlic 10 MG CAPS Take by mouth. Unsure of dose (Patient not taking: Reported on 12/25/2023)   rosuvastatin  (CRESTOR ) 10 MG tablet Take 1 tablet (10 mg total) by mouth daily.   No current facility-administered medications on file prior to visit.    Review of Systems Per HPI unless specifically indicated above     Objective:    BP (!) 140/60 (BP Location: Left Arm, Patient Position: Sitting)   Pulse 64   Ht 5' 8 (1.727 m)   Wt 174 lb 6 oz (79.1 kg)   SpO2 96%   BMI 26.51 kg/m   Wt Readings from Last  3 Encounters:  12/25/23 174 lb 6 oz (79.1 kg)  09/07/23 170 lb (77.1 kg)  07/10/23 183 lb (83 kg)    Physical Exam  Diabetic Foot Exam - Simple   Simple Foot Form Diabetic Foot exam was performed with the following findings: Yes 12/25/2023  8:49 AM  Visual Inspection See comments: Yes Sensation Testing Intact to touch and monofilament testing bilaterally: Yes Pulse Check Posterior Tibialis and Dorsalis pulse intact bilaterally: Yes Comments Right great toenail medial ingrown toenail, no redness but some localized inflammation. No callus formation or ulceration. Intact monofilament.       Results for orders placed or performed in visit on 12/20/23  [facility] DIABETES EYE EXAM   Collection Time: 12/20/23  1:38 PM  Result Value Ref Range   HM Diabetic Eye Exam No Retinopathy No Retinopathy      Assessment & Plan:   Problem List Items Addressed This Visit     BPH with obstruction/lower urinary tract symptoms   Essential hypertension   Relevant Medications   hydrochlorothiazide  (HYDRODIURIL ) 12.5 MG tablet   Type 2 diabetes mellitus with other specified complication (HCC)   Relevant Medications   metFORMIN  (GLUCOPHAGE -XR) 500 MG 24 hr tablet   Other Visit Diagnoses       Annual physical exam    -  Primary     Ingrown nail of great toe       Relevant Orders   Ambulatory referral to Podiatry     Great toe pain, right       Relevant Orders   Ambulatory referral to Podiatry        Updated Health  Maintenance information Reviewed recent lab results with patient Encouraged improvement to lifestyle with diet and exercise Goal of weight loss  Assessment and Plan Assessment & Plan      Orders Placed This Encounter  Procedures   Ambulatory referral to Podiatry    Referral Priority:   Routine    Referral Type:   Consultation    Referral Reason:   Specialty Services Required    Requested Specialty:   Podiatry    Number of Visits Requested:   1    Meds ordered this  encounter  Medications   metFORMIN  (GLUCOPHAGE -XR) 500 MG 24 hr tablet    Sig: Take 1 tablet (500 mg total) by mouth daily with breakfast.    Dispense:  90 tablet    Refill:  1   hydrochlorothiazide  (HYDRODIURIL ) 12.5 MG tablet    Sig: Take 1 tablet (12.5 mg total) by mouth daily.    Dispense:  90 tablet    Refill:  3    Add future refills     Follow up plan: Return in about 6 months (around 06/26/2024) for 6 month DM A1c.  Marsa Officer, DO [facility] 12/25/2023, 8:38 AM

## 2023-12-25 NOTE — Progress Notes (Incomplete)
 Subjective:    Patient ID: Kyle KANDICE Vivianne Mickey., male    DOB: October 26, 1940, 83 y.o.   MRN: [id number]  Kyle Litt Bryden Mickey. is a 83 y.o. male presenting on 12/25/2023 for Medical Management of Chronic Issues   HPI  Discussed the use of AI scribe software for clinical note transcription with the patient, who gave verbal consent to proceed.  History of Present Illness   Kyle Vargas is an 83 year old male who presents for an annual physical exam.  Glycemic control and diabetes management - Diabetes managed with metformin  500 mg extended-release once daily for approximately six months - Initial side effects included upset stomach and decreased energy, with improvement in energy over the past 3-4 weeks as blood glucose stabilized - Hemoglobin A1c decreased from high 8s to 7.9  Hypertriglyceridemia - History of elevated triglycerides, previously in the 300s and 400s - Recent triglyceride level decreased from 280 to 190, attributed to improved glycemic control  Anemia - Mild anemia with most recent hemoglobin of 12.4, slightly lower than baseline in the 13s - No treatment required for anemia  Proteinuria and urinary findings - History of proteinuria associated with diabetes - Intermittent bubbles in urine, attributed to proteinuria - Bubbles in urine have been less noticeable in the past few days  Right hallux pain and swelling - Soreness and puffiness of the right great toe, particularly on the medial aspect - Suspected ingrown toenail - Temporary relief with over-the-counter treatments - History of podiatry evaluation in 2016 with unsatisfactory outcome  Hypertension management - Currently taking hydrochlorothiazide  12.5 mg for blood pressure control - Medication refill is due  Vitamin b12 supplementation - Trial of B12 supplements for energy without significant improvement - B12 supplementation discontinued  General health status - No significant changes in  overall health since last visit - No new symptoms or concerns       Type 2 Diabetes A1c down to 8.4 Still elevated overall Interested in medication adjustment CBGs: Average monitored Meds: Metformin  XR 500mg  daily tolerating well Currently on ARB Lifestyle: - Diet (goal to improve, reduce sodas and chips) - Exercise (walks 30 min daily 5 x weekly, works in garden, yard, active at home) - Diabetic Eye Exam from Dr Mevelyn Denies hypoglycemia   CHRONIC HTN / Intermittent PVC - controlled. Elevated BP improved at home based on readings Followed by [facility] Dr Darron. Prior work up they checked Renal Artery US  due to spikes or elevation in BP, results show normal without stenosis. Home BP improved he admits occasional lower readings at time or dizzy. Current Meds - Losartan  100mg , HCTZ 25mg , Metoprolol  37.5mg  twice daily (25mg  tabs, 1.5 per dose), Diltiazem -24 hour 240mg  Reports good compliance, took meds today. Tolerating well, w/o complaints. - Taking ASA 81mg  x 5 days weekly, skips weekends. No known CAD or CVA   HYPERLIPIDEMIA: - Currently taking Rosuvastatin  10mg  nightly, Fish Oil  Omega 3, 1200 - taking x 3 a day   Insomnia   Health Maintenance:   PSA 1.35 (12/2023), last lab 1.32-1.33      12/25/2023    8:29 AM 11/03/2023   10:57 AM 07/10/2023    8:08 AM  Depression screen PHQ 2/9  Decreased Interest 1 0 0  Down, Depressed, Hopeless 0 0 0  PHQ - 2 Score 1 0 0  Altered sleeping 2 0 2  Tired, decreased energy 1 0 1  Change in appetite  0 0  Feeling bad or failure about  yourself  0 0 0  Trouble concentrating 0 0 0  Moving slowly or fidgety/restless 0 0 0  Suicidal thoughts 0 0 0  PHQ-9 Score 4 0 3  Difficult doing work/chores Not difficult at all Not difficult at all Not difficult at all       12/25/2023    8:31 AM 07/10/2023    8:08 AM 04/10/2023    8:22 AM 11/08/2022    8:32 AM  GAD 7 : Generalized Anxiety Score  Nervous, Anxious, on Edge 1 0 0 0   Control/stop worrying 0 0 0 0  Worry too much - different things 0 0 0 0  Trouble relaxing 0 0 0 0  Restless 0 0 0 0  Easily annoyed or irritable 1 0 0 0  Afraid - awful might happen 0 0 0 0  Total GAD 7 Score 2 0 0 0  Anxiety Difficulty Not difficult at all  Not difficult at all Not difficult at all     Past Medical History:  Diagnosis Date  . Allergic rhinitis, cause unspecified   . Diarrhea   . Dysrhythmia   . Esophageal reflux   . H/O diverticulitis of colon   . History of kidney stones   . PONV (postoperative nausea and vomiting)   . PVC's (premature ventricular contractions)    Right ventricular outflow tract   Past Surgical History:  Procedure Laterality Date  . BASAL CELL CARCINOMA EXCISION    . CATARACT EXTRACTION, BILATERAL  2020  . COLON SURGERY    . COLONOSCOPY    . COLONOSCOPY N/A 09/07/2023   Procedure: COLONOSCOPY;  Surgeon: Onita Elspeth Sharper, DO;  Location[facility] ENDOSCOPY;  Service: Gastroenterology;  Laterality: N/A;  . COLONOSCOPY WITH PROPOFOL  N/A 02/08/2017   Procedure: COLONOSCOPY WITH PROPOFOL ;  Surgeon: Viktoria Lamar DASEN, MD;  Location[facility] ENDOSCOPY;  Service: Endoscopy;  Laterality: N/A;  . EYE SURGERY    . FLEXIBLE SIGMOIDOSCOPY    . POLYPECTOMY  09/07/2023   Procedure: POLYPECTOMY, INTESTINE;  Surgeon: Onita Elspeth Sharper, DO;  Location: [facility] ENDOSCOPY;  Service: Gastroenterology;;  . REPAIR IMPERFORATE ANUS / ANORECTOPLASTY    . TRANSURETHRAL RESECTION OF PROSTATE     Social History   Socioeconomic History  . Marital status: Married    Spouse name: Millcent Castrogiovanni  . Number of children: 3  . Years of education: Not on file  . Highest education level: Some college, no degree  Occupational History  . Occupation: retired  Tobacco Use  . Smoking status: Former    Types: Cigarettes    Passive exposure: Never  . Smokeless tobacco: Never  Vaping Use  . Vaping status: Never Used  Substance and Sexual Activity  . Alcohol use: Yes     Comment: occasionally  2 glasses of wine a month   . Drug use: No  . Sexual activity: Not on file  Other Topics Concern  . Not on file  Social History Narrative  . Not on file   Social Drivers of Health   Financial Resource Strain: Low Risk  (11/03/2023)   Overall Financial Resource Strain (CARDIA)   . Difficulty of Paying Living Expenses: Not hard at all  Food Insecurity: No Food Insecurity (11/03/2023)   Hunger Vital Sign   . Worried About Programme researcher, broadcasting/film/video in the Last Year: Never true   . Ran Out of Food in the Last Year: Never true  Transportation Needs: No Transportation Needs (11/03/2023)   PRAPARE - Transportation   .  Lack of Transportation (Medical): No   . Lack of Transportation (Non-Medical): No  Physical Activity: Sufficiently Active (11/03/2023)   Exercise Vital Sign   . Days of Exercise per Week: 5 days   . Minutes of Exercise per Session: 30 min  Stress: No Stress Concern Present (11/03/2023)   [employer] of Occupational Health - Occupational Stress Questionnaire   . Feeling of Stress: Not at all  Social Connections: Moderately Integrated (11/03/2023)   Social Connection and Isolation Panel   . Frequency of Communication with Friends and Family: More than three times a week   . Frequency of Social Gatherings with Friends and Family: Once a week   . Attends Religious Services: More than 4 times per year   . Active Member of Clubs or Organizations: No   . Attends Banker Meetings: Never   . Marital Status: Married  Catering manager Violence: Not At Risk (11/03/2023)   Humiliation, Afraid, Rape, and Kick questionnaire   . Fear of Current or Ex-Partner: No   . Emotionally Abused: No   . Physically Abused: No   . Sexually Abused: No   Family History  Problem Relation Age of Onset  . Hypertension Father    Current Outpatient Medications on File Prior to Visit  Medication Sig  . aspirin 81 MG tablet Take 81 mg by mouth daily. Takes 5 days a week   . azelastine (ASTELIN) 0.1 % nasal spray Place 1 spray into both nostrils 2 (two) times daily.   . Blood Pressure Monitoring (BLOOD PRESSURE CUFF) MISC 1 Device by Does not apply route daily. Normal upper arm BP cuff  . Cyanocobalamin (VITAMIN B 12) 500 MCG TABS Take by mouth.  . diltiazem  (CARDIZEM  CD) 240 MG 24 hr capsule Take 1 capsule (240 mg total) by mouth every morning.  SABRA glucose blood test strip Use as instructed  . ipratropium (ATROVENT ) 0.06 % nasal spray Place 2 sprays into both nostrils 4 (four) times daily. For up to 5-7 days then stop.  . losartan  (COZAAR ) 100 MG tablet Take 1 tablet (100 mg total) by mouth daily.  . metoprolol  tartrate (LOPRESSOR ) 25 MG tablet Take 1.5 tablets (37.5 mg total) by mouth 2 (two) times daily.  . montelukast  (SINGULAIR ) 10 MG tablet Take 1 tablet (10 mg total) by mouth at bedtime.  . NON FORMULARY Vitreous Health Takes 1 capsule daily.  . Omega-3 Fatty Acids (OMEGA-3 FISH OIL ) 1200 MG CAPS Take 1 capsule (1,200 mg total) by mouth 3 (three) times daily.  . Sodium Chloride -Sodium Bicarb 1.57 g PACK Place into the nose as needed.  . tamsulosin  (FLOMAX ) 0.4 MG CAPS capsule Take 1 capsule (0.4 mg total) by mouth daily.  . Garlic 10 MG CAPS Take by mouth. Unsure of dose (Patient not taking: Reported on 12/25/2023)  . rosuvastatin  (CRESTOR ) 10 MG tablet Take 1 tablet (10 mg total) by mouth daily.   No current facility-administered medications on file prior to visit.    Review of Systems  Constitutional:  Negative for activity change, appetite change, chills, diaphoresis, fatigue and fever.  HENT:  Negative for congestion and hearing loss.   Eyes:  Negative for visual disturbance.  Respiratory:  Negative for cough, chest tightness, shortness of breath and wheezing.   Cardiovascular:  Negative for chest pain, palpitations and leg swelling.  Gastrointestinal:  Negative for abdominal pain, constipation, diarrhea, nausea and vomiting.  Genitourinary:   Negative for dysuria, frequency and hematuria.  Musculoskeletal:  Negative for arthralgias and  neck pain.  Skin:  Negative for rash.  Neurological:  Negative for dizziness, weakness, light-headedness, numbness and headaches.  Hematological:  Negative for adenopathy.  Psychiatric/Behavioral:  Negative for behavioral problems, dysphoric mood and sleep disturbance.    Per HPI unless specifically indicated above     Objective:    BP 138/64 (BP Location: Left Arm, Cuff Size: Normal)   Pulse 64   Ht 5' 8 (1.727 m)   Wt 174 lb 6 oz (79.1 kg)   SpO2 96%   BMI 26.51 kg/m   Wt Readings from Last 3 Encounters:  12/25/23 174 lb 6 oz (79.1 kg)  09/07/23 170 lb (77.1 kg)  07/10/23 183 lb (83 kg)    Physical Exam Vitals and nursing note reviewed.  Constitutional:      General: He is not in acute distress.    Appearance: He is well-developed. He is not diaphoretic.     Comments: Well-appearing, comfortable, cooperative  HENT:     Head: Normocephalic and atraumatic.  Eyes:     General:        Right eye: No discharge.        Left eye: No discharge.     Conjunctiva/sclera: Conjunctivae normal.     Pupils: Pupils are equal, round, and reactive to light.  Neck:     Thyroid: No thyromegaly.  Cardiovascular:     Rate and Rhythm: Normal rate and regular rhythm.     Pulses: Normal pulses.     Heart sounds: Normal heart sounds. No murmur heard. Pulmonary:     Effort: Pulmonary effort is normal. No respiratory distress.     Breath sounds: Normal breath sounds. No wheezing or rales.  Abdominal:     General: Bowel sounds are normal. There is no distension.     Palpations: Abdomen is soft. There is no mass.     Tenderness: There is no abdominal tenderness.  Musculoskeletal:        General: No tenderness. Normal range of motion.     Cervical back: Normal range of motion and neck supple.     Right lower leg: No edema.     Left lower leg: No edema.     Comments: Upper / Lower Extremities: -  Normal muscle tone, strength bilateral upper extremities 5/5, lower extremities 5/5  Lymphadenopathy:     Cervical: No cervical adenopathy.  Skin:    General: Skin is warm and dry.     Findings: No erythema or rash.  Neurological:     Mental Status: He is alert and oriented to person, place, and time.     Comments: Distal sensation intact to light touch all extremities  Psychiatric:        Mood and Affect: Mood normal.        Behavior: Behavior normal.        Thought Content: Thought content normal.     Comments: Well groomed, good eye contact, normal speech and thoughts     Diabetic Foot Exam - Simple   Simple Foot Form Diabetic Foot exam was performed with the following findings: Yes 12/25/2023  8:49 AM  Visual Inspection See comments: Yes Sensation Testing Intact to touch and monofilament testing bilaterally: Yes Pulse Check Posterior Tibialis and Dorsalis pulse intact bilaterally: Yes Comments Right great toenail medial ingrown toenail, no redness but some localized inflammation. No callus formation or ulceration. Intact monofilament.       Results for orders placed or performed in visit on 12/20/23  HM DIABETES  EYE EXAM   Collection Time: 12/20/23  1:38 PM  Result Value Ref Range   HM Diabetic Eye Exam No Retinopathy No Retinopathy      Assessment & Plan:   Problem List Items Addressed This Visit     BPH with obstruction/lower urinary tract symptoms   Essential hypertension   Relevant Medications   hydrochlorothiazide  (HYDRODIURIL ) 12.5 MG tablet   Type 2 diabetes mellitus with other specified complication (HCC)   Relevant Medications   metFORMIN  (GLUCOPHAGE -XR) 500 MG 24 hr tablet   Other Visit Diagnoses       Annual physical exam    -  Primary     Ingrown nail of great toe       Relevant Orders   Ambulatory referral to Podiatry     Great toe pain, right       Relevant Orders   Ambulatory referral to Podiatry     Long term current use of oral  hypoglycemic drug            Updated Health Maintenance information Reviewed recent lab results with patient Encouraged improvement to lifestyle with diet and exercise Goal of weight loss   Type 2 diabetes mellitus with proteinuria A1c improved to 7.9, within target range. Metformin  effective. Microalbuminuria likely diabetes-related. - Continue metformin  500 mg extended-release daily. - Monitor blood sugar levels.  Hypertriglyceridemia Triglycerides improved to 190, likely due to better blood sugar control. - Continue current management and monitor triglyceride levels.  Hypertension Managed with hydrochlorothiazide  12.5 mg. Prescription nearing expiration. - Refill hydrochlorothiazide  12.5 mg prescription.  Likely ingrown toenail, right great toe Right great toe likely has an ingrown toenail. Discussed potential treatments. - Refer to pod[facility]ry at [facility] for evaluation and management.  Adult Wellness Visit Annual wellness visit conducted. Discussed health maintenance, vaccinations, and lab results. - Monitor for availability of flu vaccine and consider COVID-19 vaccine when available. - Perform foot examination for nerve issues.  General Health Maintenance Discussed health maintenance, vaccinations, and dietary habits. Advised moderation in high-sugar fruits. Heart scan to be discussed with cardiologist. - Encourage moderation in consumption of high-sugar fruits. - Discuss heart scan with cardiologist in October.         Orders Placed This Encounter  Procedures  . Ambulatory referral to Podiatry    Referral Priority:   Routine    Referral Type:   Consultation    Referral Reason:   Specialty Services Required    Requested Specialty:   Podiatry    Number of Visits Requested:   1    Meds ordered this encounter  Medications  . metFORMIN  (GLUCOPHAGE -XR) 500 MG 24 hr tablet    Sig: Take 1 tablet (500 mg total) by mouth daily with breakfast.    Dispense:  90  tablet    Refill:  1  . hydrochlorothiazide  (HYDRODIURIL ) 12.5 MG tablet    Sig: Take 1 tablet (12.5 mg total) by mouth daily.    Dispense:  90 tablet    Refill:  3    Add future refills     Follow up plan: Return in about 6 months (around 06/26/2024) for 6 month DM A1c.  Marsa Officer, DO [facility] 12/25/2023, 8:38 AM

## 2023-12-26 NOTE — Telephone Encounter (Signed)
 Rx 12/25/23 #90 1RF- duplicate request Requested Prescriptions  Pending Prescriptions Disp Refills   metFORMIN  (GLUCOPHAGE -XR) 500 MG 24 hr tablet [Pharmacy Med Name: METFORMIN  HCL ER 500 MG TABLET] 90 tablet 0    Sig: Take 1 tablet (500 mg total) by mouth daily with supper.     Endocrinology:  Diabetes - Biguanides Failed - 12/26/2023  1:47 PM      Failed - B12 Level in normal range and within 720 days    No results found for: VITAMINB12       Passed - Cr in normal range and within 360 days    Creat  Date Value Ref Range Status  12/11/2023 1.10 0.70 - 1.22 mg/dL Final   Creatinine, Urine  Date Value Ref Range Status  12/11/2023 110 20 - 320 mg/dL Final         Passed - HBA1C is between 0 and 7.9 and within 180 days    Hgb A1c MFr Bld  Date Value Ref Range Status  12/11/2023 7.9 (H) <5.7 % Final    Comment:    For someone without known diabetes, a hemoglobin A1c value of 6.5% or greater indicates that they may have  diabetes and this should be confirmed with a follow-up  test. . For someone with known diabetes, a value <7% indicates  that their diabetes is well controlled and a value  greater than or equal to 7% indicates suboptimal  control. A1c targets should be individualized based on  duration of diabetes, age, comorbid conditions, and  other considerations. . Currently, no consensus exists regarding use of hemoglobin A1c for diagnosis of diabetes for children. .          Passed - eGFR in normal range and within 360 days    GFR, Est African American  Date Value Ref Range Status  10/15/2020 73 > OR = 60 mL/min/1.11m2 Final   GFR, Est Non African American  Date Value Ref Range Status  10/15/2020 63 > OR = 60 mL/min/1.62m2 Final   eGFR  Date Value Ref Range Status  04/03/2023 61 > OR = 60 mL/min/1.70m2 Final         Passed - Valid encounter within last 6 months    Recent Outpatient Visits           Yesterday Annual physical exam   [facility] [place], Marsa PARAS, DO   5 months ago Type 2 diabetes mellitus with other specified complication, without long-term current use of insulin [facility])   [place] [facility] [place], Marsa PARAS, DO       Future Appointments             In 2 months Darron, Deatrice LABOR, MD [place] [facility] [place]   In 6 months Stoioff, Glendia BROCKS, MD [facility] [place]            Passed - CBC within normal limits and completed in the last 12 months    WBC  Date Value Ref Range Status  12/11/2023 7.9 3.8 - 10.8 Thousand/uL Final   RBC  Date Value Ref Range Status  12/11/2023 3.99 (L) 4.20 - 5.80 Million/uL Final   Hemoglobin  Date Value Ref Range Status  12/11/2023 12.4 (L) 13.2 - 17.1 g/dL Final   HGB  Date Value Ref Range Status  06/02/2012 15.1 13.0 - 18.0 g/dL Final   HCT  Date Value Ref Range Status  12/11/2023 37.3 (L) 38.5 -  50.0 % Final  06/02/2012 44.7 40.0 - 52.0 % Final   MCHC  Date Value Ref Range Status  12/11/2023 33.2 32.0 - 36.0 g/dL Final    Comment:    For adults, a slight decrease in the calculated MCHC value (in the range of 30 to 32 g/dL) is most likely not clinically significant; however, it should be interpreted with caution in correlation with other red cell parameters and the patient's clinical condition.    [facility]  Date Value Ref Range Status  12/11/2023 31.1 27.0 - 33.0 pg Final   MCV  Date Value Ref Range Status  12/11/2023 93.5 80.0 - 100.0 fL Final  06/02/2012 93 80 - 100 fL Final   No results found for: PLTCOUNTKUC, LABPLAT, POCPLA RDW  Date Value Ref Range Status  12/11/2023 13.6 11.0 - 15.0 % Final  06/02/2012 13.4 11.5 - 14.5 % Final

## 2024-01-02 DIAGNOSIS — E119 Type 2 diabetes mellitus without complications: Secondary | ICD-10-CM | POA: Diagnosis not present

## 2024-01-02 DIAGNOSIS — L03031 Cellulitis of right toe: Secondary | ICD-10-CM | POA: Diagnosis not present

## 2024-01-16 DIAGNOSIS — E119 Type 2 diabetes mellitus without complications: Secondary | ICD-10-CM | POA: Diagnosis not present

## 2024-01-16 DIAGNOSIS — L03031 Cellulitis of right toe: Secondary | ICD-10-CM | POA: Diagnosis not present

## 2024-01-20 ENCOUNTER — Other Ambulatory Visit: Payer: Self-pay | Admitting: Cardiovascular Disease

## 2024-01-20 ENCOUNTER — Other Ambulatory Visit: Payer: Self-pay | Admitting: Family Medicine

## 2024-01-20 DIAGNOSIS — J3089 Other allergic rhinitis: Secondary | ICD-10-CM

## 2024-01-22 NOTE — Telephone Encounter (Signed)
 Requested Prescriptions  Pending Prescriptions Disp Refills   montelukast  (SINGULAIR ) 10 MG tablet [Pharmacy Med Name: MONTELUKAST  SOD 10 MG TABLET] 90 tablet 0    Sig: Take 1 tablet (10 mg total) by mouth at bedtime.     Pulmonology:  Leukotriene Inhibitors Passed - 01/22/2024  3:59 PM      Passed - Valid encounter within last 12 months    Recent Outpatient Visits           4 weeks ago Annual physical exam   [place] [facility] [place], Marsa PARAS, DO   6 months ago Type 2 diabetes mellitus with other specified complication, without long-term current use of insulin [facility])   [place] [facility] [place], Marsa PARAS, DO       Future Appointments             In 1 month Darron, Deatrice LABOR, MD  [facility] at [place]   In 5 months Stoioff, Glendia BROCKS, MD [facility]

## 2024-01-27 ENCOUNTER — Other Ambulatory Visit: Payer: Self-pay | Admitting: Family Medicine

## 2024-01-27 DIAGNOSIS — I10 Essential (primary) hypertension: Secondary | ICD-10-CM

## 2024-01-29 NOTE — Telephone Encounter (Signed)
 Requested Prescriptions  Pending Prescriptions Disp Refills   losartan  (COZAAR ) 100 MG tablet [Pharmacy Med Name: LOSARTAN  POTASSIUM 100 MG TAB] 90 tablet 1    Sig: Take 1 tablet (100 mg total) by mouth daily.     Cardiovascular:  Angiotensin Receptor Blockers Passed - 01/29/2024  1:49 PM      Passed - Cr in normal range and within 180 days    Creat  Date Value Ref Range Status  12/11/2023 1.10 0.70 - 1.22 mg/dL Final   Creatinine, Urine  Date Value Ref Range Status  12/11/2023 110 20 - 320 mg/dL Final         Passed - K in normal range and within 180 days    Potassium  Date Value Ref Range Status  12/11/2023 4.3 3.5 - 5.3 mmol/L Final  06/02/2012 3.9 3.5 - 5.1 mmol/L Final         Passed - Patient is not pregnant      Passed - Last BP in normal range    BP Readings from Last 1 Encounters:  12/25/23 138/64         Passed - Valid encounter within last 6 months    Recent Outpatient Visits           1 month ago Annual physical exam   [place] [facility] [place], Marsa PARAS, DO   6 months ago Type 2 diabetes mellitus with other specified complication, without long-term current use of insulin [facility])   [place] [facility] [place], Marsa PARAS, DO       Future Appointments             In 4 weeks Darron, Deatrice LABOR, MD [place] [facility] [place]   In 5 months Stoioff, Glendia BROCKS, MD [facility] [place]

## 2024-01-30 ENCOUNTER — Telehealth: Payer: Self-pay

## 2024-01-30 NOTE — Telephone Encounter (Signed)
 Copied from CRM #[id number]. Topic: General - Other >> Jan 29, 2024  4:31 PM Delon HERO wrote: Reason for CRM: [facility] is calling to report that the patient received the high dose flu shot and moderna COVID Vaccine today.

## 2024-01-30 NOTE — Telephone Encounter (Signed)
 Updated in chart

## 2024-02-12 ENCOUNTER — Other Ambulatory Visit: Payer: Self-pay | Admitting: Cardiovascular Disease

## 2024-02-27 ENCOUNTER — Ambulatory Visit: Attending: Cardiovascular Disease | Admitting: Cardiovascular Disease

## 2024-02-27 ENCOUNTER — Encounter: Payer: Self-pay | Admitting: Cardiovascular Disease

## 2024-02-27 VITALS — BP 120/62 | HR 61 | Ht 68.0 in | Wt 173.1 lb

## 2024-02-27 DIAGNOSIS — I493 Ventricular premature depolarization: Secondary | ICD-10-CM

## 2024-02-27 DIAGNOSIS — E781 Pure hyperglyceridemia: Secondary | ICD-10-CM | POA: Diagnosis not present

## 2024-02-27 DIAGNOSIS — I10 Essential (primary) hypertension: Secondary | ICD-10-CM

## 2024-02-27 NOTE — Patient Instructions (Signed)
 Medication Instructions:  No changes *If you need a refill on your cardiac medications before your next appointment, please call your pharmacy*  Lab Work: None ordered If you have labs (blood work) drawn today and your tests are completely normal, you will receive your results only by: MyChart Message (if you have MyChart) OR A paper copy in the mail If you have any lab test that is abnormal or we need to change your treatment, we will call you to review the results.  Testing/Procedures: None ordered  Follow-Up: At [facility], you and your health needs are our priority.  As part of our continuing mission to provide you with exceptional heart care, our providers are all part of one team.  This team includes your primary Cardiologist (physician) and Advanced Practice Providers or APPs (Physician Assistants and Nurse Practitioners) who all work together to provide you with the care you need, when you need it.  Your next appointment:   12 month(s)  Provider:   You may see Dr. Kirke Corin or one of the following Advanced Practice Providers on your designated Care Team:   Nicolasa Ducking, NP Ames Dura, PA-C Eula Listen, PA-C Cadence Folsom, PA-C Charlsie Quest, NP Carlos Levering, NP    We recommend signing up for the patient portal called "MyChart".  Sign up information is provided on this After Visit Summary.  MyChart is used to connect with patients for Virtual Visits (Telemedicine).  Patients are able to view lab/test results, encounter notes, upcoming appointments, etc.  Non-urgent messages can be sent to your provider as well.   To learn more about what you can do with MyChart, go to [email or web address].

## 2024-02-27 NOTE — Progress Notes (Unsigned)
 Cardiology Office Note   Date:  02/27/2024   ID:  Kyle Vargas., DOB February 19, 1941, MRN [id number]  PCP:  Edman Marsa PARAS, DO  Cardiologist:   Deatrice Cage, MD   Chief Complaint  Patient presents with   Follow-up    12 month f/u c/o fatigue/feeling tired. Pt would like to discuss having cardiac score testing ordered per PCP. Meds reviewed verbally with pt.       History of Present Illness: Kyle Vargas. is a 83 y.o. male who presents for a followup visit regarding palpitations and PVCs. The patient has prolonged history of palpitations thought to be due to PVCs. Negative stress test in the past.  He has chronic medical conditions that include essential hypertension, hypertriglyceridemia and borderline diabetes not on medications. Echocardiogram in 02/14 showed normal LV systolic function without significant valvular abnormalities. There was mild left ventricular hypertrophy with mild diastolic dysfunction. Holter monitor showed excessive monomorphic PVCs of RVOT origin. He has been treated with diltiazem  and Metoprolol  with excellent control of symptoms. He continues to be very active and enjoys babysitting his grandkids.  He has known history of refractory hypertension.  Renal artery duplex in 2019 showed no evidence of renal artery stenosis.  He has family history of abdominal aortic aneurysm but he had ultrasound done in 2015 and 2019 and both showed normal size aorta.  He has been doing very well with no recent chest pain, shortness of breath or palpitations.  He does complain of mild orthostatic dizziness but no syncope or presyncope. He tries to walk daily for 30 minutes and has not had any issues.     Past Medical History:  Diagnosis Date   Allergic rhinitis, cause unspecified    Diarrhea    Dysrhythmia    Esophageal reflux    H/O diverticulitis of colon    History of kidney stones    PONV (postoperative nausea and vomiting)    PVC's (premature  ventricular contractions)    Right ventricular outflow tract    Past Surgical History:  Procedure Laterality Date   BASAL CELL CARCINOMA EXCISION     CATARACT EXTRACTION, BILATERAL  2020   COLON SURGERY     COLONOSCOPY     COLONOSCOPY N/A 09/07/2023   Procedure: COLONOSCOPY;  Surgeon: Onita Elspeth Sharper, DO;  Location: [facility] ENDOSCOPY;  Service: Gastroenterology;  Laterality: N/A;   COLONOSCOPY WITH PROPOFOL  N/A 02/08/2017   Procedure: COLONOSCOPY WITH PROPOFOL ;  Surgeon: Viktoria Lamar DASEN, MD;  Location: [facility] ENDOSCOPY;  Service: Endoscopy;  Laterality: N/A;   EYE SURGERY     FLEXIBLE SIGMOIDOSCOPY     POLYPECTOMY  09/07/2023   Procedure: POLYPECTOMY, INTESTINE;  Surgeon: Onita Elspeth Sharper, DO;  Location: [facility] ENDOSCOPY;  Service: Gastroenterology;;   REPAIR IMPERFORATE ANUS / ANORECTOPLASTY     TRANSURETHRAL RESECTION OF PROSTATE       Current Outpatient Medications  Medication Sig Dispense Refill   aspirin 81 MG tablet Take 81 mg by mouth daily. Takes 5 days a week     azelastine (ASTELIN) 0.1 % nasal spray Place 1 spray into both nostrils 2 (two) times daily.      Blood Pressure Monitoring (BLOOD PRESSURE CUFF) MISC 1 Device by Does not apply route daily. Normal upper arm BP cuff 1 each 0   Cyanocobalamin (VITAMIN B 12) 500 MCG TABS Take by mouth.     diltiazem  (CARDIZEM  CD) 240 MG 24 hr capsule Take 1 capsule (240 mg total) by mouth every  morning. 90 capsule 0   glucose blood test strip Use as instructed 100 each 12   hydrochlorothiazide  (HYDRODIURIL ) 12.5 MG tablet Take 1 tablet (12.5 mg total) by mouth daily. 90 tablet 3   ipratropium (ATROVENT ) 0.06 % nasal spray Place 2 sprays into both nostrils 4 (four) times daily. For up to 5-7 days then stop. 15 mL 0   losartan  (COZAAR ) 100 MG tablet Take 1 tablet (100 mg total) by mouth daily. 90 tablet 1   metFORMIN  (GLUCOPHAGE -XR) 500 MG 24 hr tablet Take 1 tablet (500 mg total) by mouth daily with breakfast. 90 tablet 1    metoprolol  tartrate (LOPRESSOR ) 25 MG tablet Take 1.5 tablets (37.5 mg total) by mouth 2 (two) times daily. 270 tablet 3   montelukast  (SINGULAIR ) 10 MG tablet Take 1 tablet (10 mg total) by mouth at bedtime. 90 tablet 0   NON FORMULARY Vitreous Health Takes 1 capsule daily.     Omega-3 Fatty Acids (OMEGA-3 FISH OIL ) 1200 MG CAPS Take 1 capsule (1,200 mg total) by mouth 3 (three) times daily. 90 capsule 0   rosuvastatin  (CRESTOR ) 10 MG tablet Take 1 tablet (10 mg total) by mouth daily. 90 tablet 0   Sodium Chloride -Sodium Bicarb 1.57 g PACK Place into the nose as needed.     tamsulosin  (FLOMAX ) 0.4 MG CAPS capsule Take 1 capsule (0.4 mg total) by mouth daily. 90 capsule 3   Garlic 10 MG CAPS Take by mouth. Unsure of dose (Patient not taking: Reported on 02/27/2024)     No current facility-administered medications for this visit.    Allergies:   Dust mite extract and Other    Social History:  The patient  reports that he has quit smoking. His smoking use included cigarettes. He has never been exposed to tobacco smoke. He has never used smokeless tobacco. He reports current alcohol use. He reports that he does not use drugs.   Family History:  The patient's family history includes Hypertension in his father.    ROS:  Please see the history of present illness.   Otherwise, review of systems are positive for none.   All other systems are reviewed and negative.    PHYSICAL EXAM: VS:  BP 120/62 (BP Location: Left Arm, Patient Position: Sitting, Cuff Size: Normal)   Pulse 61   Ht 5' 8 (1.727 m)   Wt 173 lb 2 oz (78.5 kg)   SpO2 98%   BMI 26.32 kg/m  , BMI Body mass index is 26.32 kg/m. GEN: Well nourished, well developed, in no acute distress  HEENT: normal  Neck: no JVD, carotid bruits, or masses Cardiac: RRR; no murmurs, rubs, or gallops,no edema  Respiratory:  clear to auscultation bilaterally, normal work of breathing GI: soft, nontender, nondistended, + BS MS: no deformity or  atrophy  Skin: warm and dry, no rash Neuro:  Strength and sensation are intact Psych: euthymic mood, full affect   EKG:  EKG is ordered today. The ekg ordered today demonstrates : Sinus rhythm with 1st degree A-V block Possible Inferior infarct (cited on or before 27-Feb-2024) When compared with ECG of 21-Feb-2023 16:34, Questionable change in initial forces of Inferior leads    Recent Labs: 12/11/2023: ALT 17; BUN 15; Creat 1.10; Hemoglobin 12.4; Platelets 193; Potassium 4.3; Sodium 141    Lipid Panel    Component Value Date/Time   CHOL 90 12/11/2023 0855   CHOL 103 04/07/2021 0910   TRIG 190 (H) 12/11/2023 0855   HDL 45 12/11/2023  0855   HDL 33 (L) 04/07/2021 0910   CHOLHDL 2.0 12/11/2023 0855   VLDL 45 (H) 07/06/2016 0001   LDLCALC 19 12/11/2023 0855      Wt Readings from Last 3 Encounters:  02/27/24 173 lb 2 oz (78.5 kg)  12/25/23 174 lb 6 oz (79.1 kg)  09/07/23 170 lb (77.1 kg)           No data to display            ASSESSMENT AND PLAN:  1.  Symptomatic PVCs: Well controlled on current dose of diltiazem  and metoprolol  with no side effects. Normal ejection fraction.  We will refill both medications.  2. Essential hypertension: He has refractory hypertension with no evidence of secondary hypertension.  His blood pressure is reasonably controlled on current medications.    3.  Hypertriglyceridemia: I reviewed most recent lipid profile done in June which showed an LDL of 23 and triglyceride of 282.  Given that he is diabetic, recommend a target LDL of less than 70.  Continue rosuvastatin .  His triglycerides also improved compared to last year.   Disposition:   FU with me in 1 year  Signed,  Deatrice Cage, MD  02/27/2024 4:09 PM    [facility]

## 2024-04-11 DIAGNOSIS — D2272 Melanocytic nevi of left lower limb, including hip: Secondary | ICD-10-CM | POA: Diagnosis not present

## 2024-04-11 DIAGNOSIS — L821 Other seborrheic keratosis: Secondary | ICD-10-CM | POA: Diagnosis not present

## 2024-04-11 DIAGNOSIS — Z85828 Personal history of other malignant neoplasm of skin: Secondary | ICD-10-CM | POA: Diagnosis not present

## 2024-04-11 DIAGNOSIS — L570 Actinic keratosis: Secondary | ICD-10-CM | POA: Diagnosis not present

## 2024-04-11 DIAGNOSIS — D2262 Melanocytic nevi of left upper limb, including shoulder: Secondary | ICD-10-CM | POA: Diagnosis not present

## 2024-04-11 DIAGNOSIS — D2271 Melanocytic nevi of right lower limb, including hip: Secondary | ICD-10-CM | POA: Diagnosis not present

## 2024-04-11 DIAGNOSIS — Z08 Encounter for follow-up examination after completed treatment for malignant neoplasm: Secondary | ICD-10-CM | POA: Diagnosis not present

## 2024-04-11 DIAGNOSIS — D485 Neoplasm of uncertain behavior of skin: Secondary | ICD-10-CM | POA: Diagnosis not present

## 2024-04-11 DIAGNOSIS — D2261 Melanocytic nevi of right upper limb, including shoulder: Secondary | ICD-10-CM | POA: Diagnosis not present

## 2024-04-11 DIAGNOSIS — D225 Melanocytic nevi of trunk: Secondary | ICD-10-CM | POA: Diagnosis not present

## 2024-04-19 DIAGNOSIS — M5432 Sciatica, left side: Secondary | ICD-10-CM | POA: Diagnosis not present

## 2024-04-19 DIAGNOSIS — M9903 Segmental and somatic dysfunction of lumbar region: Secondary | ICD-10-CM | POA: Diagnosis not present

## 2024-04-20 ENCOUNTER — Other Ambulatory Visit: Payer: Self-pay | Admitting: Family Medicine

## 2024-04-20 DIAGNOSIS — J3089 Other allergic rhinitis: Secondary | ICD-10-CM

## 2024-04-22 DIAGNOSIS — M9903 Segmental and somatic dysfunction of lumbar region: Secondary | ICD-10-CM | POA: Diagnosis not present

## 2024-04-22 DIAGNOSIS — M5432 Sciatica, left side: Secondary | ICD-10-CM | POA: Diagnosis not present

## 2024-04-23 NOTE — Telephone Encounter (Signed)
 Requested Prescriptions  Pending Prescriptions Disp Refills   montelukast  (SINGULAIR ) 10 MG tablet [Pharmacy Med Name: MONTELUKAST  SOD 10 MG TABLET] 90 tablet 0    Sig: Take 1 tablet (10 mg total) by mouth at bedtime.     Pulmonology:  Leukotriene Inhibitors Passed - 04/23/2024 12:33 PM      Passed - Valid encounter within last 12 months    Recent Outpatient Visits           4 months ago Annual physical exam   [place] [facility], Marsa PARAS, DO   9 months ago Type 2 diabetes mellitus with other specified complication, without long-term current use of insulin [facility])   [place] [facility] [place], Marsa PARAS, DO       Future Appointments             In 2 months Stoioff, Glendia BROCKS, MD [facility]

## 2024-04-24 DIAGNOSIS — M9903 Segmental and somatic dysfunction of lumbar region: Secondary | ICD-10-CM | POA: Diagnosis not present

## 2024-04-24 DIAGNOSIS — M5432 Sciatica, left side: Secondary | ICD-10-CM | POA: Diagnosis not present

## 2024-04-26 DIAGNOSIS — M5432 Sciatica, left side: Secondary | ICD-10-CM | POA: Diagnosis not present

## 2024-04-26 DIAGNOSIS — M9903 Segmental and somatic dysfunction of lumbar region: Secondary | ICD-10-CM | POA: Diagnosis not present

## 2024-04-27 ENCOUNTER — Other Ambulatory Visit: Payer: Self-pay | Admitting: Cardiovascular Disease

## 2024-04-29 DIAGNOSIS — M9903 Segmental and somatic dysfunction of lumbar region: Secondary | ICD-10-CM | POA: Diagnosis not present

## 2024-04-29 DIAGNOSIS — M5432 Sciatica, left side: Secondary | ICD-10-CM | POA: Diagnosis not present

## 2024-05-01 DIAGNOSIS — M5432 Sciatica, left side: Secondary | ICD-10-CM | POA: Diagnosis not present

## 2024-05-01 DIAGNOSIS — M9903 Segmental and somatic dysfunction of lumbar region: Secondary | ICD-10-CM | POA: Diagnosis not present

## 2024-05-03 DIAGNOSIS — M5432 Sciatica, left side: Secondary | ICD-10-CM | POA: Diagnosis not present

## 2024-05-03 DIAGNOSIS — M9903 Segmental and somatic dysfunction of lumbar region: Secondary | ICD-10-CM | POA: Diagnosis not present

## 2024-05-06 DIAGNOSIS — M5432 Sciatica, left side: Secondary | ICD-10-CM | POA: Diagnosis not present

## 2024-05-06 DIAGNOSIS — M9903 Segmental and somatic dysfunction of lumbar region: Secondary | ICD-10-CM | POA: Diagnosis not present

## 2024-05-13 ENCOUNTER — Other Ambulatory Visit: Payer: Self-pay | Admitting: Cardiovascular Disease

## 2024-07-01 ENCOUNTER — Ambulatory Visit: Admitting: Family Medicine

## 2024-07-02 ENCOUNTER — Ambulatory Visit: Admitting: Urology

## 2024-07-04 ENCOUNTER — Ambulatory Visit: Payer: Medicare HMO | Admitting: Urology

## 2024-11-13 ENCOUNTER — Ambulatory Visit

## 2024-11-15 ENCOUNTER — Ambulatory Visit
